# Patient Record
Sex: Female | Born: 1955
Health system: Southern US, Community
[De-identification: ages and names within clinical notes are randomized; demographics above are authoritative.]

## PROBLEM LIST (undated history)

## (undated) DIAGNOSIS — J45909 Unspecified asthma, uncomplicated: Secondary | ICD-10-CM

## (undated) DIAGNOSIS — I10 Essential (primary) hypertension: Secondary | ICD-10-CM

## (undated) DIAGNOSIS — B1920 Unspecified viral hepatitis C without hepatic coma: Secondary | ICD-10-CM

## (undated) DIAGNOSIS — F32A Depression, unspecified: Secondary | ICD-10-CM

## (undated) DIAGNOSIS — F329 Major depressive disorder, single episode, unspecified: Secondary | ICD-10-CM

## (undated) DIAGNOSIS — G44009 Cluster headache syndrome, unspecified, not intractable: Secondary | ICD-10-CM

## (undated) DIAGNOSIS — K2210 Ulcer of esophagus without bleeding: Secondary | ICD-10-CM

## (undated) HISTORY — DX: Depression, unspecified: F32.A

## (undated) HISTORY — DX: Cluster headache syndrome, unspecified, not intractable: G44.009

## (undated) HISTORY — DX: Unspecified asthma, uncomplicated: J45.909

## (undated) HISTORY — DX: Major depressive disorder, single episode, unspecified: F32.9

## (undated) HISTORY — DX: Essential (primary) hypertension: I10

## (undated) HISTORY — PX: NO PAST SURGERIES: SHX2092

## (undated) HISTORY — DX: Ulcer of esophagus without bleeding: K22.10

## (undated) HISTORY — DX: Unspecified viral hepatitis C without hepatic coma: B19.20

---

## 1998-08-23 ENCOUNTER — Encounter: Payer: Self-pay | Admitting: Gastroenterology

## 1998-08-23 ENCOUNTER — Ambulatory Visit (HOSPITAL_COMMUNITY): Admission: RE | Admit: 1998-08-23 | Discharge: 1998-08-23 | Payer: Self-pay | Admitting: Gastroenterology

## 1998-11-16 ENCOUNTER — Encounter (INDEPENDENT_AMBULATORY_CARE_PROVIDER_SITE_OTHER): Payer: Self-pay | Admitting: Specialist

## 1998-11-16 ENCOUNTER — Ambulatory Visit (HOSPITAL_COMMUNITY): Admission: RE | Admit: 1998-11-16 | Discharge: 1998-11-16 | Payer: Self-pay | Admitting: Gastroenterology

## 2000-03-16 ENCOUNTER — Encounter: Admission: RE | Admit: 2000-03-16 | Discharge: 2000-03-16 | Payer: Self-pay | Admitting: Obstetrics and Gynecology

## 2000-03-16 ENCOUNTER — Encounter: Payer: Self-pay | Admitting: Obstetrics and Gynecology

## 2001-05-29 ENCOUNTER — Encounter: Admission: RE | Admit: 2001-05-29 | Discharge: 2001-05-29 | Payer: Self-pay | Admitting: Obstetrics and Gynecology

## 2001-05-29 ENCOUNTER — Encounter: Payer: Self-pay | Admitting: Obstetrics and Gynecology

## 2002-10-01 ENCOUNTER — Encounter: Payer: Self-pay | Admitting: Family Medicine

## 2002-10-01 ENCOUNTER — Encounter: Admission: RE | Admit: 2002-10-01 | Discharge: 2002-10-01 | Payer: Self-pay | Admitting: Obstetrics and Gynecology

## 2008-06-23 ENCOUNTER — Ambulatory Visit (HOSPITAL_COMMUNITY): Admission: RE | Admit: 2008-06-23 | Discharge: 2008-06-23 | Payer: Self-pay | Admitting: Obstetrics and Gynecology

## 2009-07-24 ENCOUNTER — Emergency Department (HOSPITAL_COMMUNITY): Admission: EM | Admit: 2009-07-24 | Discharge: 2009-07-24 | Payer: Self-pay | Admitting: Family Medicine

## 2009-10-18 ENCOUNTER — Encounter: Admission: RE | Admit: 2009-10-18 | Discharge: 2009-10-18 | Payer: Self-pay | Admitting: Obstetrics and Gynecology

## 2010-07-13 LAB — POCT RAPID STREP A (OFFICE): Streptococcus, Group A Screen (Direct): NEGATIVE

## 2011-04-21 ENCOUNTER — Other Ambulatory Visit: Payer: Self-pay | Admitting: Obstetrics and Gynecology

## 2011-04-21 DIAGNOSIS — Z1231 Encounter for screening mammogram for malignant neoplasm of breast: Secondary | ICD-10-CM

## 2011-05-26 ENCOUNTER — Other Ambulatory Visit (HOSPITAL_COMMUNITY)
Admission: RE | Admit: 2011-05-26 | Discharge: 2011-05-26 | Disposition: A | Discharge status: Home / Self Care | Payer: 59 | Source: Ambulatory Visit | Attending: Obstetrics and Gynecology | Admitting: Obstetrics and Gynecology

## 2011-05-26 ENCOUNTER — Other Ambulatory Visit: Payer: Self-pay | Admitting: Obstetrics and Gynecology

## 2011-05-26 DIAGNOSIS — Z01419 Encounter for gynecological examination (general) (routine) without abnormal findings: Secondary | ICD-10-CM | POA: Insufficient documentation

## 2011-05-29 ENCOUNTER — Ambulatory Visit
Admission: RE | Admit: 2011-05-29 | Discharge: 2011-05-29 | Disposition: A | Discharge status: Home / Self Care | Payer: 59 | Source: Ambulatory Visit | Attending: Obstetrics and Gynecology | Admitting: Obstetrics and Gynecology

## 2011-05-29 DIAGNOSIS — Z1231 Encounter for screening mammogram for malignant neoplasm of breast: Secondary | ICD-10-CM

## 2011-06-01 ENCOUNTER — Other Ambulatory Visit: Payer: Self-pay | Admitting: Gastroenterology

## 2011-06-02 ENCOUNTER — Ambulatory Visit
Admission: RE | Admit: 2011-06-02 | Discharge: 2011-06-02 | Disposition: A | Discharge status: Home / Self Care | Payer: 59 | Source: Ambulatory Visit | Attending: Gastroenterology | Admitting: Gastroenterology

## 2011-06-06 ENCOUNTER — Other Ambulatory Visit: Payer: Self-pay | Admitting: Obstetrics and Gynecology

## 2011-06-06 DIAGNOSIS — R928 Other abnormal and inconclusive findings on diagnostic imaging of breast: Secondary | ICD-10-CM

## 2011-06-08 ENCOUNTER — Other Ambulatory Visit: Payer: Self-pay | Admitting: Gastroenterology

## 2011-06-08 DIAGNOSIS — E278 Other specified disorders of adrenal gland: Secondary | ICD-10-CM

## 2011-06-13 ENCOUNTER — Ambulatory Visit
Admission: RE | Admit: 2011-06-13 | Discharge: 2011-06-13 | Disposition: A | Discharge status: Home / Self Care | Payer: 59 | Source: Ambulatory Visit | Attending: Gastroenterology | Admitting: Gastroenterology

## 2011-06-13 DIAGNOSIS — E278 Other specified disorders of adrenal gland: Secondary | ICD-10-CM

## 2011-06-14 ENCOUNTER — Ambulatory Visit
Admission: RE | Admit: 2011-06-14 | Discharge: 2011-06-14 | Disposition: A | Discharge status: Home / Self Care | Payer: 59 | Source: Ambulatory Visit | Attending: Obstetrics and Gynecology | Admitting: Obstetrics and Gynecology

## 2011-06-14 DIAGNOSIS — R928 Other abnormal and inconclusive findings on diagnostic imaging of breast: Secondary | ICD-10-CM

## 2012-01-08 ENCOUNTER — Other Ambulatory Visit: Payer: Self-pay | Admitting: Obstetrics and Gynecology

## 2012-01-08 DIAGNOSIS — N6009 Solitary cyst of unspecified breast: Secondary | ICD-10-CM

## 2012-01-10 ENCOUNTER — Other Ambulatory Visit: Payer: Self-pay | Admitting: Obstetrics and Gynecology

## 2012-01-10 ENCOUNTER — Ambulatory Visit
Admission: RE | Admit: 2012-01-10 | Discharge: 2012-01-10 | Disposition: A | Discharge status: Home / Self Care | Payer: 59 | Source: Ambulatory Visit | Attending: Obstetrics and Gynecology | Admitting: Obstetrics and Gynecology

## 2012-01-10 DIAGNOSIS — N6009 Solitary cyst of unspecified breast: Secondary | ICD-10-CM

## 2012-05-27 ENCOUNTER — Other Ambulatory Visit (HOSPITAL_COMMUNITY)
Admission: RE | Admit: 2012-05-27 | Discharge: 2012-05-27 | Disposition: A | Discharge status: Home / Self Care | Payer: No Typology Code available for payment source | Source: Ambulatory Visit | Attending: Obstetrics and Gynecology | Admitting: Obstetrics and Gynecology

## 2012-05-27 ENCOUNTER — Other Ambulatory Visit: Payer: Self-pay | Admitting: Obstetrics and Gynecology

## 2012-05-27 DIAGNOSIS — Z1151 Encounter for screening for human papillomavirus (HPV): Secondary | ICD-10-CM | POA: Insufficient documentation

## 2012-05-27 DIAGNOSIS — Z01419 Encounter for gynecological examination (general) (routine) without abnormal findings: Secondary | ICD-10-CM | POA: Insufficient documentation

## 2012-07-05 ENCOUNTER — Other Ambulatory Visit: Payer: Self-pay | Admitting: Obstetrics and Gynecology

## 2012-07-05 DIAGNOSIS — N6310 Unspecified lump in the right breast, unspecified quadrant: Secondary | ICD-10-CM

## 2012-07-18 ENCOUNTER — Ambulatory Visit
Admission: RE | Admit: 2012-07-18 | Discharge: 2012-07-18 | Disposition: A | Discharge status: Home / Self Care | Payer: No Typology Code available for payment source | Source: Ambulatory Visit | Attending: Obstetrics and Gynecology | Admitting: Obstetrics and Gynecology

## 2012-07-18 ENCOUNTER — Other Ambulatory Visit: Payer: Self-pay | Admitting: Obstetrics and Gynecology

## 2012-07-18 DIAGNOSIS — N6310 Unspecified lump in the right breast, unspecified quadrant: Secondary | ICD-10-CM

## 2013-07-15 ENCOUNTER — Other Ambulatory Visit: Payer: Self-pay | Admitting: Family Medicine

## 2013-07-15 DIAGNOSIS — N63 Unspecified lump in unspecified breast: Secondary | ICD-10-CM

## 2013-07-29 ENCOUNTER — Ambulatory Visit
Admission: RE | Admit: 2013-07-29 | Discharge: 2013-07-29 | Disposition: A | Discharge status: Home / Self Care | Payer: BC Managed Care – PPO | Source: Ambulatory Visit | Attending: Family Medicine | Admitting: Family Medicine

## 2013-07-29 ENCOUNTER — Ambulatory Visit
Admission: RE | Admit: 2013-07-29 | Discharge: 2013-07-29 | Disposition: A | Discharge status: Home / Self Care | Payer: No Typology Code available for payment source | Source: Ambulatory Visit | Attending: Family Medicine | Admitting: Family Medicine

## 2013-07-29 DIAGNOSIS — N63 Unspecified lump in unspecified breast: Secondary | ICD-10-CM

## 2014-06-30 ENCOUNTER — Other Ambulatory Visit: Payer: Self-pay | Admitting: Dermatology

## 2014-09-08 ENCOUNTER — Other Ambulatory Visit (HOSPITAL_COMMUNITY)
Admission: RE | Admit: 2014-09-08 | Discharge: 2014-09-08 | Disposition: A | Discharge status: Home / Self Care | Payer: BLUE CROSS/BLUE SHIELD | Source: Ambulatory Visit | Attending: Obstetrics and Gynecology | Admitting: Obstetrics and Gynecology

## 2014-09-08 ENCOUNTER — Other Ambulatory Visit: Payer: Self-pay | Admitting: Obstetrics and Gynecology

## 2014-09-08 DIAGNOSIS — Z01419 Encounter for gynecological examination (general) (routine) without abnormal findings: Secondary | ICD-10-CM | POA: Insufficient documentation

## 2014-09-10 LAB — CYTOLOGY - PAP

## 2014-11-02 ENCOUNTER — Other Ambulatory Visit: Payer: Self-pay

## 2014-11-02 DIAGNOSIS — Z1231 Encounter for screening mammogram for malignant neoplasm of breast: Secondary | ICD-10-CM

## 2014-11-04 ENCOUNTER — Ambulatory Visit
Admission: RE | Admit: 2014-11-04 | Discharge: 2014-11-04 | Disposition: A | Discharge status: Home / Self Care | Payer: BLUE CROSS/BLUE SHIELD | Source: Ambulatory Visit

## 2014-11-04 DIAGNOSIS — Z1231 Encounter for screening mammogram for malignant neoplasm of breast: Secondary | ICD-10-CM

## 2015-01-13 ENCOUNTER — Ambulatory Visit
Admission: RE | Admit: 2015-01-13 | Discharge: 2015-01-13 | Disposition: A | Discharge status: Home / Self Care | Payer: BLUE CROSS/BLUE SHIELD | Source: Ambulatory Visit | Attending: Family Medicine | Admitting: Family Medicine

## 2015-01-13 ENCOUNTER — Other Ambulatory Visit: Payer: Self-pay | Admitting: Family Medicine

## 2015-01-13 DIAGNOSIS — R05 Cough: Secondary | ICD-10-CM

## 2015-01-13 DIAGNOSIS — R059 Cough, unspecified: Secondary | ICD-10-CM

## 2015-10-12 DIAGNOSIS — J45909 Unspecified asthma, uncomplicated: Secondary | ICD-10-CM | POA: Insufficient documentation

## 2015-10-12 DIAGNOSIS — I10 Essential (primary) hypertension: Secondary | ICD-10-CM | POA: Insufficient documentation

## 2015-10-12 DIAGNOSIS — F32A Depression, unspecified: Secondary | ICD-10-CM | POA: Insufficient documentation

## 2015-10-12 DIAGNOSIS — F329 Major depressive disorder, single episode, unspecified: Secondary | ICD-10-CM | POA: Insufficient documentation

## 2015-10-22 ENCOUNTER — Encounter: Payer: Self-pay | Admitting: *Deleted

## 2015-10-24 NOTE — Progress Notes (Signed)
Cardiology Office Note   Date:  10/25/2015   ID:  [name], DOB [birth year], MRN [id number]  PCP:  Lujean Amel, MD  Cardiologist:   Jenkins Rouge, MD   Chief Complaint  Patient presents with  . Establish Care      History of Present Illness: [name] is a 60 y.o. female who presents for  Evaluation of edema Significant depression. Husband committed suicide from carbon monoxide poisoning 2016  She has had HTN.  Rx with amlodipine and diuretics. No history  Of DVT.  Sedentary with high salt and carb diet. No chest pain Concern about Risk of CAD and MI.    Labs primary Dr [name] 06/2014 LDL 79 TC 175   Past Medical History  Diagnosis Date  . HTN (hypertension)   . Depression   . Asthma   . Headaches, cluster   . Hepatitis C   . Erosive esophagitis     Past Surgical History  Procedure Laterality Date  . No past surgeries       Current Outpatient Prescriptions  Medication Sig Dispense Refill  . amLODipine (NORVASC) 5 MG tablet Take 5 mg by mouth daily.    . Calcium Carb-Cholecalciferol (CALCIUM-VITAMIN D3) 600-400 MG-UNIT TABS Take 1 tablet by mouth daily.    . hydrochlorothiazide (HYDRODIURIL) 25 MG tablet Take 25 mg by mouth daily.    Marland Kitchen HYDROcodone-acetaminophen (NORCO/VICODIN) 5-325 MG tablet Take 1 tablet by mouth daily.  0  . Multiple Vitamins-Minerals (MULTIVITAMIN PO) Take 1 tablet by mouth daily.    . naproxen (NAPROSYN) 500 MG tablet Take 500 mg by mouth daily.    . Omega-3 Fatty Acids (FISH OIL) 1200 MG CAPS Take 1 capsule by mouth daily.    Marland Kitchen omeprazole (PRILOSEC) 40 MG capsule Take 40 mg by mouth daily.  10  . Venlafaxine HCl 75 MG TB24 Take 75 mg by mouth daily.     No current facility-administered medications for this visit.    Allergies:   Atenolol; Lisinopril; Paxil; Zoloft; and Sulfur    Social History:  The patient  reports that she has never smoked. She does not have any smokeless tobacco history on file. 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 Infrequent wine consumption   Family History:  The patient's family history includes Bipolar disorder in her daughter; Colitis in her brother; Coronary artery disease in her mother; Crohn's disease in her sister; Diabetes Mellitus II in her mother; Emphysema in her father; Heart attack in her sister; Heart disease in her brother, brother, and mother; Irritable bowel syndrome in her daughter; Liver disease in her sister; Stroke in her brother.    ROS:  Please see the history of present illness.   Otherwise, review of systems are positive for none.   All other systems are reviewed and negative.    PHYSICAL EXAM: VS:  BP 120/90 mmHg  Pulse 63  Ht 5\' 4"  (1.626 m)  Wt 214 lb 12.8 oz (97.433 kg)  BMI 36.85 kg/m2  SpO2 95% , BMI Body mass index is 36.85 kg/(m^2). Affect appropriate Healthy:  appears stated age 73: normal Neck supple with no adenopathy JVP normal no bruits no thyromegaly Lungs clear with no wheezing and good diaphragmatic motion Heart:  S1/S2 no murmur, no rub, gallop or click PMI normal Abdomen: benighn, BS positve, no tenderness, no AAA no bruit.  No HSM or HJR Distal pulses intact with no bruits Plus  1-2 bilateral edema Neuro non-focal Skin warm and dry No muscular weakness    EKG:   10/25/15 SR rate 83 ICRBBB    Recent Labs: No results found for requested labs within last 365 days.    Lipid Panel No results found for: CHOL, TRIG, HDL, CHOLHDL, VLDL, LDLCALC, LDLDIRECT    Wt Readings from Last 3 Encounters:  10/25/15 214 lb 12.8 oz (97.433 kg)      Other studies Reviewed: Additional studies/ records that were reviewed today include: Records from Dr Sadie Haber Dr [name] over 72 pages see HPI .    ASSESSMENT AND PLAN:  1.  HTN  Stop amlodipine given edema continue diuretics 2. Edema:  Check echo and LE venous studies make sure RV/LV function normal r/o venous insuficiency  If  edema persists Off norvasc will change to stronger diuretic like lasix 3. CAD- risk no indication for stress testing ECG ok discussed Utility of calcium score for her to consider    Current medicines are reviewed at length with the patient today.  The patient does not have concerns regarding medicines.  The following changes have been made:  D/c Norvasc  Labs/ tests ordered today include: LE venous Echo    Disposition:   FU with me in a few weeks after tests      Signed, Jenkins Rouge, MD  10/25/2015 5:17 PM    [facility] [place], [place],   [address] Phone: [phone]; Fax: [phone]

## 2015-10-25 ENCOUNTER — Ambulatory Visit (INDEPENDENT_AMBULATORY_CARE_PROVIDER_SITE_OTHER): Payer: BLUE CROSS/BLUE SHIELD | Admitting: Cardiovascular Disease

## 2015-10-25 ENCOUNTER — Encounter: Payer: Self-pay | Admitting: Cardiovascular Disease

## 2015-10-25 VITALS — BP 120/90 | HR 63 | Ht 64.0 in | Wt 214.8 lb

## 2015-10-25 DIAGNOSIS — R0602 Shortness of breath: Secondary | ICD-10-CM

## 2015-10-25 DIAGNOSIS — Z7189 Other specified counseling: Secondary | ICD-10-CM | POA: Diagnosis not present

## 2015-10-25 DIAGNOSIS — I10 Essential (primary) hypertension: Secondary | ICD-10-CM | POA: Diagnosis not present

## 2015-10-25 DIAGNOSIS — R609 Edema, unspecified: Secondary | ICD-10-CM | POA: Diagnosis not present

## 2015-10-25 DIAGNOSIS — Z7689 Persons encountering health services in other specified circumstances: Secondary | ICD-10-CM

## 2015-10-25 NOTE — Patient Instructions (Addendum)
Medication Instructions:  Your physician has recommended you make the following change in your medication:  1-STOP amlodipine   Lab work: NONE  Testing/Procedures: Your physician has requested that you have an echocardiogram. Echocardiography is a painless test that uses sound waves to create images of your heart. It provides your doctor with information about the size and shape of your heart and how well your heart's chambers and valves are working. This procedure takes approximately one hour. There are no restrictions for this procedure.  Your physician has requested that you have a lower extremity venous duplex. This test is an ultrasound of the veins in the legs. It looks at venous blood flow that carries blood from the heart to the legs or arms. Allow one hour for a Lower Venous exam. There are no restrictions or special instructions.  Cardiac Ca Scote scanning, (CAT scanning), is a noninvasive, special x-ray that produces cross-sectional images of the body using x-rays and a computer.   Follow-Up: Your physician wants you to follow-up in: 1 month with Dr. Johnsie Cancel.   If you need a refill on your cardiac medications before your next appointment, please call your pharmacy.

## 2015-11-05 ENCOUNTER — Ambulatory Visit (HOSPITAL_COMMUNITY)
Admission: RE | Admit: 2015-11-05 | Discharge: 2015-11-05 | Disposition: A | Discharge status: Home / Self Care | Payer: BLUE CROSS/BLUE SHIELD | Source: Ambulatory Visit | Attending: Cardiovascular Disease | Admitting: Cardiovascular Disease

## 2015-11-05 DIAGNOSIS — Z7189 Other specified counseling: Secondary | ICD-10-CM | POA: Diagnosis not present

## 2015-11-05 DIAGNOSIS — Z7689 Persons encountering health services in other specified circumstances: Secondary | ICD-10-CM

## 2015-11-05 DIAGNOSIS — F329 Major depressive disorder, single episode, unspecified: Secondary | ICD-10-CM | POA: Insufficient documentation

## 2015-11-05 DIAGNOSIS — R0602 Shortness of breath: Secondary | ICD-10-CM | POA: Insufficient documentation

## 2015-11-05 DIAGNOSIS — I10 Essential (primary) hypertension: Secondary | ICD-10-CM

## 2015-11-05 DIAGNOSIS — R609 Edema, unspecified: Secondary | ICD-10-CM | POA: Insufficient documentation

## 2015-11-08 ENCOUNTER — Telehealth: Payer: Self-pay | Admitting: Cardiovascular Disease

## 2015-11-08 NOTE — Telephone Encounter (Signed)
Follow-up     The pt is returning [place] call

## 2015-11-08 NOTE — Telephone Encounter (Signed)
PT AWARE  OF   LOWER VENOUS  RESULTS .Adonis Housekeeper

## 2015-11-11 ENCOUNTER — Ambulatory Visit (INDEPENDENT_AMBULATORY_CARE_PROVIDER_SITE_OTHER)
Admission: RE | Admit: 2015-11-11 | Discharge: 2015-11-11 | Disposition: A | Discharge status: Home / Self Care | Payer: Self-pay | Source: Ambulatory Visit | Attending: Cardiovascular Disease | Admitting: Cardiovascular Disease

## 2015-11-11 ENCOUNTER — Ambulatory Visit (HOSPITAL_COMMUNITY): Payer: BLUE CROSS/BLUE SHIELD | Attending: Cardiology

## 2015-11-11 DIAGNOSIS — Z7689 Persons encountering health services in other specified circumstances: Secondary | ICD-10-CM

## 2015-11-11 DIAGNOSIS — Z7189 Other specified counseling: Secondary | ICD-10-CM

## 2015-11-11 DIAGNOSIS — R0602 Shortness of breath: Secondary | ICD-10-CM | POA: Diagnosis not present

## 2015-11-11 DIAGNOSIS — Z8249 Family history of ischemic heart disease and other diseases of the circulatory system: Secondary | ICD-10-CM | POA: Insufficient documentation

## 2015-11-11 DIAGNOSIS — R609 Edema, unspecified: Secondary | ICD-10-CM

## 2015-11-11 DIAGNOSIS — I119 Hypertensive heart disease without heart failure: Secondary | ICD-10-CM | POA: Insufficient documentation

## 2015-11-11 DIAGNOSIS — I10 Essential (primary) hypertension: Secondary | ICD-10-CM | POA: Diagnosis not present

## 2015-11-11 DIAGNOSIS — R0600 Dyspnea, unspecified: Secondary | ICD-10-CM | POA: Diagnosis present

## 2015-12-01 ENCOUNTER — Encounter: Payer: Self-pay | Admitting: Cardiovascular Disease

## 2015-12-01 ENCOUNTER — Ambulatory Visit (INDEPENDENT_AMBULATORY_CARE_PROVIDER_SITE_OTHER): Payer: BLUE CROSS/BLUE SHIELD | Admitting: Cardiovascular Disease

## 2015-12-01 VITALS — BP 136/80 | HR 96 | Ht 64.0 in | Wt 216.0 lb

## 2015-12-01 DIAGNOSIS — R0602 Shortness of breath: Secondary | ICD-10-CM

## 2015-12-01 NOTE — Progress Notes (Signed)
Cardiology Office Note   Date:  12/01/2015   ID:  [name], DOB 02-Sep-1955, MRN [id number]  PCP:  [name] Amel, MD  Cardiologist:   [name] Rouge, MD   Chief Complaint  Patient presents with  . Shortness of Breath    seen for SOB & swelling in ankles, swelling has went down and is good, SOB is still an issue with activity, pe rpt      History of Present Illness: [name] is a 60 y.o. female who presents for  Evaluation of edema first seen 10/25/15  Significant depression. Husband committed suicide from carbon monoxide poisoning 2016  She has had HTN.  Rx with amlodipine and diuretics. No history  Of DVT.  Sedentary with high salt and carb diet. No chest pain Concern about Risk of CAD and MI.    Labs primary Dr [name] 06/2014 LDL 79 TC 175   Norvasc d/c   Echo reviewed: 11/11/15 EF normal AV sclerotic normal diastolic function  LE venous Duplex 11/05/15 no DVT  Calcium Score : 11/11/15 0  Past Medical History:  Diagnosis Date  . Asthma   . Depression   . Erosive esophagitis   . Headaches, cluster   . Hepatitis C   . HTN (hypertension)     Past Surgical History:  Procedure Laterality Date  . NO PAST SURGERIES       Current Outpatient Prescriptions  Medication Sig Dispense Refill  . Calcium Carb-Cholecalciferol (CALCIUM-VITAMIN D3) 600-400 MG-UNIT TABS Take 1 tablet by mouth daily.    . hydrochlorothiazide (HYDRODIURIL) 25 MG tablet Take 25 mg by mouth daily.    Marland Kitchen HYDROcodone-acetaminophen (NORCO/VICODIN) 5-325 MG tablet Take 1 tablet by mouth daily as needed for pain.  0  . Multiple Vitamins-Minerals (MULTIVITAMIN PO) Take 1 tablet by mouth daily.    . naproxen (NAPROSYN) 500 MG tablet Take 500 mg by mouth daily as needed for mild pain or moderate pain.    . Omega-3 Fatty Acids (FISH OIL) 1200 MG CAPS Take 1 capsule by mouth daily.    Marland Kitchen omeprazole (PRILOSEC) 40 MG capsule Take 40 mg by mouth daily.  10  . Venlafaxine HCl 75 MG TB24 Take 75 mg by mouth  daily.     No current facility-administered medications for this visit.     Allergies:   Atenolol; Lisinopril; Paxil [paroxetine hcl]; Zoloft [sertraline hcl]; and Sulfur    Social History:  The patient  reports that she has never smoked. She does not have any smokeless tobacco history on file. 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 Infrequent wine consumption   Family History:  The patient's family history includes Bipolar disorder in her daughter; Colitis in her brother; Coronary artery disease in her mother; Crohn's disease in her sister; Diabetes Mellitus II in her mother; Emphysema in her father; Heart attack in her sister; Heart disease in her brother, brother, and mother; Irritable bowel syndrome in her daughter; Liver disease in her sister; Stroke in her brother.    ROS:  Please see the history of present illness.   Otherwise, review of systems are positive for none.   All other systems are reviewed and negative.    PHYSICAL EXAM: VS:  BP 136/80 (BP Location: Left Arm, Patient Position: Sitting, Cuff Size: Large)   Pulse 96   Ht 5\' 4"  (1.626 m)   Wt 216 lb (98 kg)   SpO2 98%   BMI  37.08 kg/m  , BMI Body mass index is 37.08 kg/m. Affect appropriate Healthy:  appears stated age 55: normal Neck supple with no adenopathy JVP normal no bruits no thyromegaly Lungs clear with no wheezing and good diaphragmatic motion Heart:  S1/S2 no murmur, no rub, gallop or click PMI normal Abdomen: benighn, BS positve, no tenderness, no AAA no bruit.  No HSM or HJR Distal pulses intact with no bruits Plus 1-2 bilateral edema Neuro non-focal Skin warm and dry No muscular weakness    EKG:   10/25/15 SR rate 83 ICRBBB    Recent Labs: No results found for requested labs within last 8760 hours.    Lipid Panel No results found for: CHOL, TRIG, HDL, CHOLHDL, VLDL, LDLCALC, LDLDIRECT    Wt Readings from Last 3 Encounters:   12/01/15 216 lb (98 kg)  10/25/15 214 lb 12.8 oz (97.4 kg)      Other studies Reviewed: Additional studies/ records that were reviewed today include: Records from Dr [name] Dr [name] over 85 pages see HPI .    ASSESSMENT AND PLAN:  1.  HTN   Some improvement still a bit high on home readings she will try to continue With lifestyle modifications.  If still high next visit would start cardizem or hydralazine 2. Edema:   Echo and LE duplex fine better of norvasc continue diuretic 3. CAD- risk low calcium score is 0 no need for further testing    [name]

## 2015-12-01 NOTE — Patient Instructions (Signed)
Medication Instructions:  Your physician recommends that you continue on your current medications as directed. Please refer to the Current Medication list given to you today.  Labwork: NONE  Testing/Procedures: NONE  Follow-Up: Your physician wants you to follow-up next available with Dr. Nishan.   If you need a refill on your cardiac medications before your next appointment, please call your pharmacy.    

## 2016-01-11 ENCOUNTER — Other Ambulatory Visit (HOSPITAL_COMMUNITY)
Admission: RE | Admit: 2016-01-11 | Discharge: 2016-01-11 | Disposition: A | Discharge status: Home / Self Care | Payer: BLUE CROSS/BLUE SHIELD | Source: Ambulatory Visit | Attending: Obstetrics and Gynecology | Admitting: Obstetrics and Gynecology

## 2016-01-11 ENCOUNTER — Other Ambulatory Visit: Payer: Self-pay | Admitting: Obstetrics and Gynecology

## 2016-01-11 DIAGNOSIS — Z01419 Encounter for gynecological examination (general) (routine) without abnormal findings: Secondary | ICD-10-CM | POA: Diagnosis present

## 2016-01-11 DIAGNOSIS — Z1151 Encounter for screening for human papillomavirus (HPV): Secondary | ICD-10-CM | POA: Diagnosis not present

## 2016-01-13 LAB — CYTOLOGY - PAP

## 2016-02-28 ENCOUNTER — Other Ambulatory Visit: Payer: Self-pay | Admitting: Obstetrics and Gynecology

## 2016-02-28 DIAGNOSIS — Z1231 Encounter for screening mammogram for malignant neoplasm of breast: Secondary | ICD-10-CM

## 2016-03-15 ENCOUNTER — Ambulatory Visit (INDEPENDENT_AMBULATORY_CARE_PROVIDER_SITE_OTHER): Payer: BLUE CROSS/BLUE SHIELD | Admitting: Cardiovascular Disease

## 2016-03-15 ENCOUNTER — Encounter: Payer: Self-pay | Admitting: Cardiovascular Disease

## 2016-03-15 ENCOUNTER — Encounter (INDEPENDENT_AMBULATORY_CARE_PROVIDER_SITE_OTHER): Payer: Self-pay

## 2016-03-15 VITALS — BP 140/80 | HR 94 | Ht 64.0 in | Wt 201.2 lb

## 2016-03-15 DIAGNOSIS — Z09 Encounter for follow-up examination after completed treatment for conditions other than malignant neoplasm: Secondary | ICD-10-CM | POA: Diagnosis not present

## 2016-03-15 NOTE — Progress Notes (Signed)
Cardiology Office Note   Date:  03/15/2016   ID:  [name], DOB 1955-09-27, MRN [id number]  PCP:  Lujean Amel, MD  Cardiologist:   Jenkins Rouge, MD   Chief Complaint  Patient presents with  . Follow-up      History of Present Illness: [name] is a 60 y.o. female who presents for  Evaluation of edema first seen 10/25/15  Significant depression. Husband committed suicide from carbon monoxide poisoning 2016  She has had HTN.  Rx with amlodipine and diuretics. No history  Of DVT.  Sedentary with high salt and carb diet. No chest pain Concern about Risk of CAD and MI.    Labs primary Dr [name] 06/2014 LDL 79 TC 175   Norvasc d/c   Echo reviewed: 11/11/15 EF normal AV sclerotic normal diastolic function  LE venous Duplex 11/05/15 no DVT  Calcium Score : 11/11/15 0  Life still stressful. Daughter is pregnant and started spotting Her 60 yo girl Maddy just got diagnosed with type one DM  Past Medical History:  Diagnosis Date  . Asthma   . Depression   . Erosive esophagitis   . Headaches, cluster   . Hepatitis C   . HTN (hypertension)     Past Surgical History:  Procedure Laterality Date  . NO PAST SURGERIES       Current Outpatient Prescriptions  Medication Sig Dispense Refill  . Calcium Carb-Cholecalciferol (CALCIUM-VITAMIN D3) 600-400 MG-UNIT TABS Take 1 tablet by mouth daily.    . Glucosamine-Chondroitin (OSTEO BI-FLEX REGULAR STRENGTH PO) Take 1 tablet by mouth daily.    . hydrochlorothiazide (HYDRODIURIL) 25 MG tablet Take 25 mg by mouth daily.    Marland Kitchen HYDROcodone-acetaminophen (NORCO/VICODIN) 5-325 MG tablet Take 1 tablet by mouth daily as needed for pain.  0  . Multiple Vitamins-Minerals (MULTIVITAMIN PO) Take 1 tablet by mouth daily.    . naproxen (NAPROSYN) 500 MG tablet Take 500 mg by mouth daily as needed for mild pain or moderate pain.    . Omega-3 Fatty Acids (FISH OIL) 1200 MG CAPS Take 1 capsule by mouth daily.    Marland Kitchen omeprazole (PRILOSEC) 40  MG capsule Take 40 mg by mouth daily.  10  . Venlafaxine HCl 75 MG TB24 Take 75 mg by mouth daily.     No current facility-administered medications for this visit.     Allergies:   Atenolol; Lisinopril; Paxil [paroxetine hcl]; Zoloft [sertraline hcl]; and Sulfur    Social History:  The patient  reports that she has never smoked. She has never used smokeless tobacco. 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 Infrequent wine consumption   Family History:  The patient's family history includes Bipolar disorder in her daughter; Colitis in her brother; Coronary artery disease in her mother; Crohn's disease in her sister; Diabetes Mellitus II in her mother; Emphysema in her father; Heart attack in her sister; Heart disease in her brother, brother, and mother; Irritable bowel syndrome in her daughter; Liver disease in her sister; Stroke in her brother.    ROS:  Please see the history of present illness.   Otherwise, review of systems are positive for none.   All other systems are reviewed and negative.    PHYSICAL EXAM: VS:  BP 140/80   Pulse 94   Ht 5\' 4"  (1.626 m)   Wt 91.3 kg (201 lb 3.2 oz)   SpO2 97%   BMI 34.54 kg/m  ,  BMI Body mass index is 34.54 kg/m. Affect appropriate Healthy:  appears stated age 36: normal Neck supple with no adenopathy JVP normal no bruits no thyromegaly Lungs clear with no wheezing and good diaphragmatic motion Heart:  S1/S2 no murmur, no rub, gallop or click PMI normal Abdomen: benighn, BS positve, no tenderness, no AAA no bruit.  No HSM or HJR Distal pulses intact with no bruits Plus 1-2 bilateral edema Neuro non-focal Skin warm and dry No muscular weakness    EKG:   10/25/15 SR rate 83 ICRBBB    Recent Labs: No results found for requested labs within last 8760 hours.    Lipid Panel No results found for: CHOL, TRIG, HDL, CHOLHDL, VLDL, LDLCALC, LDLDIRECT    Wt Readings from Last  3 Encounters:  03/15/16 91.3 kg (201 lb 3.2 oz)  12/01/15 98 kg (216 lb)  10/25/15 97.4 kg (214 lb 12.8 oz)      Other studies Reviewed: Additional studies/ records that were reviewed today include: Records from Dr Sadie Haber Dr [name] over 61 pages see HPI .    ASSESSMENT AND PLAN:  1.  HTN   Improved continue to go to gym and lose weight  2. Edema:   Echo and LE duplex fine better of norvasc continue diuretic 3. CAD- risk low calcium score is 0 no need for further testing    Jenkins Rouge

## 2016-03-15 NOTE — Patient Instructions (Signed)
Medication Instructions:  Your physician recommends that you continue on your current medications as directed. Please refer to the Current Medication list given to you today.  Labwork: None  Testing/Procedures: None  Follow-Up: Your physician wants you to follow-up in: 1 year with Dr. Nishan.  You will receive a reminder letter in the mail two months in advance. If you don't receive a letter, please call our office to schedule the follow-up appointment.   Any Other Special Instructions Will Be Listed Below (If Applicable).     If you need a refill on your cardiac medications before your next appointment, please call your pharmacy.   

## 2016-03-30 ENCOUNTER — Ambulatory Visit
Admission: RE | Admit: 2016-03-30 | Discharge: 2016-03-30 | Disposition: A | Discharge status: Home / Self Care | Payer: BLUE CROSS/BLUE SHIELD | Source: Ambulatory Visit | Attending: Obstetrics and Gynecology | Admitting: Obstetrics and Gynecology

## 2016-03-30 DIAGNOSIS — Z1231 Encounter for screening mammogram for malignant neoplasm of breast: Secondary | ICD-10-CM

## 2016-07-07 ENCOUNTER — Encounter: Payer: Self-pay | Admitting: Podiatry

## 2016-07-07 ENCOUNTER — Ambulatory Visit (INDEPENDENT_AMBULATORY_CARE_PROVIDER_SITE_OTHER): Payer: BLUE CROSS/BLUE SHIELD | Admitting: Podiatry

## 2016-07-07 DIAGNOSIS — L600 Ingrowing nail: Secondary | ICD-10-CM | POA: Diagnosis not present

## 2016-07-07 NOTE — Patient Instructions (Signed)

## 2016-07-09 NOTE — Progress Notes (Signed)
Subjective:     Patient ID: [name], female   DOB: 22-Dec-1955, 61 y.o.   MRN: [id number]  HPI patient presents stating that the left nail has been really bothersome and making it hard for her to be comfortable and she's tried to soak and trimming   Review of Systems  All other systems reviewed and are negative.      Objective:   Physical Exam  Constitutional: She is oriented to person, place, and time.  Neurological: She is alert and oriented to person, place, and time.  Skin: Skin is warm.  Nursing note and vitals reviewed.  neurovascular status found to be intact with muscle strength adequate range of motion within normal limits with patient found have left hallux lateral border incurvated and painful when palpated. Patient's noted to have good digital perfusion well oriented 3     Assessment:     Significant ingrown toenail deformity left hallux lateral border    Plan:     H&P condition reviewed and recommended removal of the border. Explained procedure and risk and patient wants surgery and today I infiltrated the left hallux 60 Milligan's I can Marcaine mixture remove the lateral border exposed matrix and applied phenol 3 applications 30 seconds followed by alcohol lavage and sterile dressing. Gave instructions on soaks and reappoint

## 2016-09-27 ENCOUNTER — Encounter: Payer: Self-pay | Admitting: Podiatry

## 2016-09-27 ENCOUNTER — Ambulatory Visit (INDEPENDENT_AMBULATORY_CARE_PROVIDER_SITE_OTHER): Payer: BLUE CROSS/BLUE SHIELD | Admitting: Podiatry

## 2016-09-27 DIAGNOSIS — L03032 Cellulitis of left toe: Secondary | ICD-10-CM

## 2016-09-27 NOTE — Progress Notes (Signed)
Subjective:    Patient ID: [name], female   DOB: 61 y.o.   MRN: [id number]   HPI patient presents with irritation of the left big toenail lateral side that we had done procedure on. Patient states it's crusted and it's red and sore    ROS      Objective:  Physical Exam neurovascular status intact negative Homans sign noted with crusted tissue with proximal erythema left hallux lateral side that's localized in nature with no active drainage noted     Assessment:    Localized paronychia infection left hallux lateral border with crusted tissue     Plan:   H&P condition reviewed and today I infiltrated the left hallux 60 Milligan times like Marcaine mixture remove the lateral side removed all proud flesh abscess tissue and allowed channel for drainage and applied sterile dressing. Reappoint to recheck

## 2017-03-12 ENCOUNTER — Other Ambulatory Visit: Payer: Self-pay | Admitting: Obstetrics and Gynecology

## 2017-03-12 DIAGNOSIS — Z1231 Encounter for screening mammogram for malignant neoplasm of breast: Secondary | ICD-10-CM

## 2017-04-09 ENCOUNTER — Ambulatory Visit
Admission: RE | Admit: 2017-04-09 | Discharge: 2017-04-09 | Disposition: A | Discharge status: Home / Self Care | Payer: BLUE CROSS/BLUE SHIELD | Source: Ambulatory Visit | Attending: Obstetrics and Gynecology | Admitting: Obstetrics and Gynecology

## 2017-04-09 DIAGNOSIS — Z1231 Encounter for screening mammogram for malignant neoplasm of breast: Secondary | ICD-10-CM

## 2017-05-16 ENCOUNTER — Telehealth: Payer: Self-pay | Admitting: Cardiovascular Disease

## 2017-05-16 NOTE — Telephone Encounter (Signed)
   Primary Cardiologist:No primary care provider on file.  Chart reviewed as part of pre-operative protocol coverage. Because of [name]'s past medical history and time since last visit, he/she will require a follow-up visit in order to better assess preoperative cardiovascular risk.  She has a visit with Dr. Johnsie Cancel in February - would ask her to keep this and surgical clearance will be addressed at that time. Her knee surgery is set up for April of 2018.   Pre-op covering staff: - Please schedule appointment and call patient to inform them. - Please contact requesting surgeon's office via preferred method (i.e, phone, fax) to inform them of need for appointment prior to surgery.  Truitt Merle, NP  05/16/2017, 2:35 PM

## 2017-05-16 NOTE — Telephone Encounter (Signed)
° °  [facility] Pre-operative Risk Assessment    Request for surgical clearance:  1. What type of surgery is being performed? Right Uni Knee Replacement    2. When is this surgery scheduled? 08/02/17  3. What type of clearance is required (medical clearance vs. Pharmacy clearance to hold med vs. Both)? Medical clearance   4. Are there any medications that need to be held prior to surgery and how long? Please indicate any medications that need to be held.   5. Practice name and name of physician performing surgery? [facility], Dr. Marchia Bond   6. What is your office phone and fax number? PH# [phone] ext [phone] [name] [phone], ATTN: Sherri   7. Anesthesia type (None, local, MAC, general) ? Not listed.    [name] 05/16/2017, 10:38 AM  _________________________________________________________________   (provider comments below)

## 2017-05-16 NOTE — Telephone Encounter (Signed)
S/w pt to advise to keep upcoming appt with Dr. Johnsie Cancel in February to talk about surgical clearance for knee surgery in April.

## 2017-05-28 NOTE — Progress Notes (Signed)
Cardiology Office Note   Date:  06/04/2017   ID:  [name], DOB Feb 18, 1956, MRN [id number]  PCP:  Lujean Amel, MD  Cardiologist:   Jenkins Rouge, MD   No chief complaint on file.     History of Present Illness: [name] is a 62 y.o. female who presents for f/u edema and HTN  First seen 10/25/15  Significant depression. Husband committed suicide from carbon monoxide poisoning 2016  She has had HTN.  Rx with amlodipine and diuretics. No history  Of DVT.  Sedentary with high salt and carb diet. No chest pain Concern about Risk of CAD and MI.    Norvasc d/c   Echo reviewed: 11/11/15 EF normal AV sclerotic normal diastolic function  LE venous Duplex 11/05/15 no DVT  Calcium Score : 11/11/15 0  Stressed Her100 yo  Cla[place] daughter  Maddy ha  type one DM  She is scheduled for right knee replacement with Dr Noemi Chapel in April She is clear to have this from cardiac perspective   She has been taking narcotic pain pills and can't exercise Failed Injections, rooster cone and nerve blocks   Past Medical History:  Diagnosis Date  . Asthma   . Depression   . Erosive esophagitis   . Headaches, cluster   . Hepatitis C   . HTN (hypertension)     Past Surgical History:  Procedure Laterality Date  . NO PAST SURGERIES       Current Outpatient Medications  Medication Sig Dispense Refill  . Calcium Carb-Cholecalciferol (CALCIUM-VITAMIN D3) 600-400 MG-UNIT TABS Take 1 tablet by mouth daily.    . Glucosamine-Chondroitin (OSTEO BI-FLEX REGULAR STRENGTH PO) Take 1 tablet by mouth daily.    . hydrochlorothiazide (HYDRODIURIL) 25 MG tablet Take 25 mg by mouth daily.    Marland Kitchen HYDROcodone-acetaminophen (NORCO/VICODIN) 5-325 MG tablet Take 1 tablet by mouth daily as needed for pain.  0  . irbesartan (AVAPRO) 150 MG tablet Take 1 tablet by mouth daily.    . Multiple Vitamins-Minerals (MULTIVITAMIN PO) Take 1 tablet by mouth daily.    . naproxen (NAPROSYN) 500 MG tablet Take 500 mg by  mouth daily as needed for mild pain or moderate pain.    . Omega-3 Fatty Acids (FISH OIL) 1200 MG CAPS Take 1 capsule by mouth daily.    Marland Kitchen omeprazole (PRILOSEC) 40 MG capsule Take 40 mg by mouth daily.  10  . Venlafaxine HCl 75 MG TB24 Take 75 mg by mouth daily.     No current facility-administered medications for this visit.     Allergies:   Atenolol; Lisinopril; Paxil [paroxetine hcl]; Zoloft [sertraline hcl]; and Sulfur    Social History:  The patient  reports that  has never smoked. she has never used smokeless tobacco. 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 Infrequent wine consumption   Family History:  The patient's family history includes Bipolar disorder in her daughter; Colitis in her brother; Coronary artery disease in her mother; Crohn's disease in her sister; Diabetes Mellitus II in her mother; Emphysema in her father; Heart attack in her sister; Heart disease in her brother, brother, and mother; Irritable bowel syndrome in her daughter; Liver disease in her sister; Stroke in her brother.    ROS:  Please see the history of present illness.   Otherwise, review of systems are positive for none.   All other systems are reviewed and negative.  PHYSICAL EXAM: VS:  BP (!) 154/68 Comment: 138 82  Pulse (!) 115   Ht 5\' 4"  (1.626 m)   Wt 211 lb 3.2 oz (95.8 kg)   SpO2 98%   BMI 36.25 kg/m  , BMI Body mass index is 36.25 kg/m. Affect appropriate Healthy:  appears stated age 82: normal Neck supple with no adenopathy JVP normal no bruits no thyromegaly Lungs clear with no wheezing and good diaphragmatic motion Heart:  S1/S2 no murmur, no rub, gallop or click PMI normal Abdomen: benighn, BS positve, no tenderness, no AAA no bruit.  No HSM or HJR Distal pulses intact with no bruits No edema Neuro non-focal Skin warm and dry Right knee pain/arthritis with decreased ROM      EKG:   10/25/15 SR rate 83 ICRBBB   06/04/17 SR rate 94 low voltage    Recent Labs: No results found for requested labs within last 8760 hours.    Lipid Panel No results found for: CHOL, TRIG, HDL, CHOLHDL, VLDL, LDLCALC, LDLDIRECT    Wt Readings from Last 3 Encounters:  06/04/17 211 lb 3.2 oz (95.8 kg)  03/15/16 201 lb 3.2 oz (91.3 kg)  12/01/15 216 lb (98 kg)      Other studies Reviewed: Additional studies/ records that were reviewed today include: Records from Dr Sadie Haber Dr [name] over 69 pages see HPI .    ASSESSMENT AND PLAN:  1.  HTN   Improved continue to go to gym and lose weight  2. Edema:   Echo and LE duplex fine better of norvasc continue diuretic 3. CAD- risk low calcium score is 0 no need for further testing  4. Pre op: clear to have right TKR with Dr Noemi Chapel April 2019 with no further testing   Jenkins Rouge

## 2017-06-04 ENCOUNTER — Encounter: Payer: Self-pay | Admitting: Cardiovascular Disease

## 2017-06-04 ENCOUNTER — Ambulatory Visit: Payer: Managed Care, Other (non HMO) | Admitting: Cardiovascular Disease

## 2017-06-04 VITALS — BP 154/68 | HR 115 | Ht 64.0 in | Wt 211.2 lb

## 2017-06-04 DIAGNOSIS — I10 Essential (primary) hypertension: Secondary | ICD-10-CM | POA: Diagnosis not present

## 2017-06-04 DIAGNOSIS — I2510 Atherosclerotic heart disease of native coronary artery without angina pectoris: Secondary | ICD-10-CM | POA: Diagnosis not present

## 2017-06-04 DIAGNOSIS — Z01818 Encounter for other preprocedural examination: Secondary | ICD-10-CM | POA: Diagnosis not present

## 2017-06-04 NOTE — Patient Instructions (Addendum)
Medication Instructions:  Your physician recommends that you continue on your current medications as directed. Please refer to the Current Medication list given to you today.  Labwork: NONE  Testing/Procedures: NONE  Follow-Up: Your physician wants you to follow-up as needed.  If you need a refill on your cardiac medications before your next appointment, please call your pharmacy.   

## 2018-04-09 ENCOUNTER — Other Ambulatory Visit: Payer: Self-pay | Admitting: Obstetrics and Gynecology

## 2018-04-09 DIAGNOSIS — Z1231 Encounter for screening mammogram for malignant neoplasm of breast: Secondary | ICD-10-CM

## 2018-04-25 ENCOUNTER — Ambulatory Visit
Admission: RE | Admit: 2018-04-25 | Discharge: 2018-04-25 | Disposition: A | Discharge status: Home / Self Care | Payer: Managed Care, Other (non HMO) | Source: Ambulatory Visit | Attending: Obstetrics and Gynecology | Admitting: Obstetrics and Gynecology

## 2018-04-25 DIAGNOSIS — Z1231 Encounter for screening mammogram for malignant neoplasm of breast: Secondary | ICD-10-CM

## 2019-01-01 ENCOUNTER — Other Ambulatory Visit: Payer: Self-pay | Admitting: *Deleted

## 2019-01-01 DIAGNOSIS — Z20822 Contact with and (suspected) exposure to covid-19: Secondary | ICD-10-CM

## 2019-01-03 LAB — NOVEL CORONAVIRUS, NAA: SARS-CoV-2, NAA: NOT DETECTED

## 2019-03-07 ENCOUNTER — Other Ambulatory Visit: Payer: Self-pay

## 2019-03-07 DIAGNOSIS — Z20822 Contact with and (suspected) exposure to covid-19: Secondary | ICD-10-CM

## 2019-03-09 LAB — NOVEL CORONAVIRUS, NAA: SARS-CoV-2, NAA: DETECTED — AB

## 2019-03-24 ENCOUNTER — Other Ambulatory Visit: Payer: Self-pay

## 2019-03-24 DIAGNOSIS — Z20822 Contact with and (suspected) exposure to covid-19: Secondary | ICD-10-CM

## 2019-03-25 LAB — NOVEL CORONAVIRUS, NAA: SARS-CoV-2, NAA: NOT DETECTED

## 2019-07-18 ENCOUNTER — Other Ambulatory Visit: Payer: Self-pay | Admitting: Obstetrics and Gynecology

## 2019-07-18 DIAGNOSIS — Z1231 Encounter for screening mammogram for malignant neoplasm of breast: Secondary | ICD-10-CM

## 2019-07-28 ENCOUNTER — Ambulatory Visit: Payer: Managed Care, Other (non HMO) | Attending: Internal Medicine

## 2019-07-28 DIAGNOSIS — Z20822 Contact with and (suspected) exposure to covid-19: Secondary | ICD-10-CM

## 2019-07-29 LAB — SARS-COV-2, NAA 2 DAY TAT

## 2019-07-29 LAB — NOVEL CORONAVIRUS, NAA: SARS-CoV-2, NAA: NOT DETECTED

## 2019-07-30 ENCOUNTER — Ambulatory Visit: Payer: Managed Care, Other (non HMO)

## 2019-08-08 ENCOUNTER — Other Ambulatory Visit: Payer: Self-pay

## 2019-08-08 ENCOUNTER — Ambulatory Visit
Admission: RE | Admit: 2019-08-08 | Discharge: 2019-08-08 | Disposition: A | Discharge status: Home / Self Care | Payer: Managed Care, Other (non HMO) | Source: Ambulatory Visit | Attending: Obstetrics and Gynecology | Admitting: Obstetrics and Gynecology

## 2019-08-08 DIAGNOSIS — Z1231 Encounter for screening mammogram for malignant neoplasm of breast: Secondary | ICD-10-CM

## 2019-09-10 ENCOUNTER — Ambulatory Visit: Payer: Self-pay | Admitting: Podiatry

## 2019-09-23 ENCOUNTER — Telehealth: Payer: Self-pay | Admitting: *Deleted

## 2019-09-23 NOTE — Telephone Encounter (Signed)
Left message informing pt if she felt she was having another ingrown toenail or paronychia, she should begin soaking in epsom salt 1/4 cup in 1 quart warm water for 20 minutes twice daily and afterwards apply an antibiotic ointment bandaid perform daily until seen in office, if other type of problem call for assistance and also could call after 11:00am on most weekday and there may be cancellations.

## 2019-09-23 NOTE — Telephone Encounter (Signed)
Patient is having problems with sore toe and has an appointment scheduled for June 17th with Dr. Paulla Dolly. What should she do in the meantime?

## 2019-10-09 ENCOUNTER — Ambulatory Visit: Payer: Managed Care, Other (non HMO) | Admitting: Podiatry

## 2019-10-29 ENCOUNTER — Ambulatory Visit (INDEPENDENT_AMBULATORY_CARE_PROVIDER_SITE_OTHER): Payer: Managed Care, Other (non HMO)

## 2019-10-29 ENCOUNTER — Ambulatory Visit: Payer: Managed Care, Other (non HMO) | Admitting: Podiatry

## 2019-10-29 ENCOUNTER — Encounter: Payer: Self-pay | Admitting: Podiatry

## 2019-10-29 ENCOUNTER — Other Ambulatory Visit: Payer: Self-pay

## 2019-10-29 VITALS — Temp 97.6°F

## 2019-10-29 DIAGNOSIS — M2042 Other hammer toe(s) (acquired), left foot: Secondary | ICD-10-CM

## 2019-10-29 DIAGNOSIS — M2041 Other hammer toe(s) (acquired), right foot: Secondary | ICD-10-CM

## 2019-10-29 DIAGNOSIS — L84 Corns and callosities: Secondary | ICD-10-CM

## 2019-10-30 NOTE — Progress Notes (Signed)
Subjective:   Patient ID: [name], female   DOB: 64 y.o.   MRN: [id number]   HPI Patient presents concerned about lesions on the toes and also nail disease with the nails being thicker and discoloration of the left big toenail.  Patient states also concerned because the big toes are leading into the second digit    ROS      Objective:  Physical Exam  Neurovascular status intact with patient found to have keratotic lesion with digital deformity along with nail disease with probable trauma with no pain associated with the nail     Assessment:  Probability for keratotic lesion secondary to hammertoe deformity along with probability for traumatized nail with no indications currently of other pathology      Plan:  H&P reviewed conditions and I have recommended debridement which was accomplished today I discussed the nail discoloration and the possibility that there may be trauma and I wanted to grow out that if it were to start to become painful or the glycolated were to extend proximal we will get a biopsy but at this point I do think it is trauma and hopefully will grow out over the next few months.  Patient will be seen back as needed with both areas extensive education given

## 2020-04-24 HISTORY — PX: KNEE SURGERY: SHX244

## 2020-05-29 ENCOUNTER — Other Ambulatory Visit: Payer: Self-pay

## 2020-05-29 ENCOUNTER — Observation Stay (HOSPITAL_COMMUNITY): Payer: Managed Care, Other (non HMO)

## 2020-05-29 ENCOUNTER — Inpatient Hospital Stay (HOSPITAL_BASED_OUTPATIENT_CLINIC_OR_DEPARTMENT_OTHER)
Admission: EM | Admit: 2020-05-29 | Discharge: 2020-06-05 | DRG: 871 | Disposition: A | Discharge status: Home Health | Payer: Managed Care, Other (non HMO) | Attending: Internal Medicine | Admitting: Internal Medicine

## 2020-05-29 ENCOUNTER — Encounter (HOSPITAL_BASED_OUTPATIENT_CLINIC_OR_DEPARTMENT_OTHER): Payer: Self-pay | Admitting: Emergency Medicine

## 2020-05-29 ENCOUNTER — Emergency Department (HOSPITAL_BASED_OUTPATIENT_CLINIC_OR_DEPARTMENT_OTHER): Payer: Managed Care, Other (non HMO)

## 2020-05-29 DIAGNOSIS — K219 Gastro-esophageal reflux disease without esophagitis: Secondary | ICD-10-CM | POA: Diagnosis present

## 2020-05-29 DIAGNOSIS — Z8619 Personal history of other infectious and parasitic diseases: Secondary | ICD-10-CM

## 2020-05-29 DIAGNOSIS — U071 COVID-19: Secondary | ICD-10-CM | POA: Diagnosis present

## 2020-05-29 DIAGNOSIS — I10 Essential (primary) hypertension: Secondary | ICD-10-CM | POA: Diagnosis present

## 2020-05-29 DIAGNOSIS — E8809 Other disorders of plasma-protein metabolism, not elsewhere classified: Secondary | ICD-10-CM | POA: Diagnosis present

## 2020-05-29 DIAGNOSIS — N179 Acute kidney failure, unspecified: Secondary | ICD-10-CM | POA: Diagnosis present

## 2020-05-29 DIAGNOSIS — J1282 Pneumonia due to coronavirus disease 2019: Secondary | ICD-10-CM

## 2020-05-29 DIAGNOSIS — E871 Hypo-osmolality and hyponatremia: Secondary | ICD-10-CM | POA: Diagnosis present

## 2020-05-29 DIAGNOSIS — F419 Anxiety disorder, unspecified: Secondary | ICD-10-CM | POA: Diagnosis present

## 2020-05-29 DIAGNOSIS — Z6836 Body mass index (BMI) 36.0-36.9, adult: Secondary | ICD-10-CM

## 2020-05-29 DIAGNOSIS — J90 Pleural effusion, not elsewhere classified: Secondary | ICD-10-CM

## 2020-05-29 DIAGNOSIS — Z8249 Family history of ischemic heart disease and other diseases of the circulatory system: Secondary | ICD-10-CM

## 2020-05-29 DIAGNOSIS — Z79899 Other long term (current) drug therapy: Secondary | ICD-10-CM

## 2020-05-29 DIAGNOSIS — Z833 Family history of diabetes mellitus: Secondary | ICD-10-CM

## 2020-05-29 DIAGNOSIS — F325 Major depressive disorder, single episode, in full remission: Secondary | ICD-10-CM

## 2020-05-29 DIAGNOSIS — Z96652 Presence of left artificial knee joint: Secondary | ICD-10-CM | POA: Diagnosis present

## 2020-05-29 DIAGNOSIS — I5030 Unspecified diastolic (congestive) heart failure: Secondary | ICD-10-CM | POA: Diagnosis present

## 2020-05-29 DIAGNOSIS — Z791 Long term (current) use of non-steroidal anti-inflammatories (NSAID): Secondary | ICD-10-CM

## 2020-05-29 DIAGNOSIS — J45909 Unspecified asthma, uncomplicated: Secondary | ICD-10-CM | POA: Diagnosis present

## 2020-05-29 DIAGNOSIS — R7989 Other specified abnormal findings of blood chemistry: Secondary | ICD-10-CM | POA: Diagnosis not present

## 2020-05-29 DIAGNOSIS — E876 Hypokalemia: Secondary | ICD-10-CM | POA: Diagnosis not present

## 2020-05-29 DIAGNOSIS — A403 Sepsis due to Streptococcus pneumoniae: Principal | ICD-10-CM | POA: Diagnosis present

## 2020-05-29 DIAGNOSIS — R0781 Pleurodynia: Secondary | ICD-10-CM | POA: Diagnosis not present

## 2020-05-29 DIAGNOSIS — E872 Acidosis, unspecified: Secondary | ICD-10-CM

## 2020-05-29 DIAGNOSIS — J13 Pneumonia due to Streptococcus pneumoniae: Secondary | ICD-10-CM | POA: Diagnosis present

## 2020-05-29 DIAGNOSIS — I5033 Acute on chronic diastolic (congestive) heart failure: Secondary | ICD-10-CM | POA: Diagnosis present

## 2020-05-29 DIAGNOSIS — F32A Depression, unspecified: Secondary | ICD-10-CM | POA: Diagnosis present

## 2020-05-29 DIAGNOSIS — Z888 Allergy status to other drugs, medicaments and biological substances status: Secondary | ICD-10-CM

## 2020-05-29 DIAGNOSIS — R0602 Shortness of breath: Secondary | ICD-10-CM

## 2020-05-29 DIAGNOSIS — Z538 Procedure and treatment not carried out for other reasons: Secondary | ICD-10-CM | POA: Diagnosis present

## 2020-05-29 DIAGNOSIS — I110 Hypertensive heart disease with heart failure: Secondary | ICD-10-CM | POA: Diagnosis present

## 2020-05-29 DIAGNOSIS — Z825 Family history of asthma and other chronic lower respiratory diseases: Secondary | ICD-10-CM

## 2020-05-29 DIAGNOSIS — Z823 Family history of stroke: Secondary | ICD-10-CM

## 2020-05-29 HISTORY — DX: Hypokalemia: E87.6

## 2020-05-29 LAB — CBC WITH DIFFERENTIAL/PLATELET
Abs Immature Granulocytes: 0.07 10*3/uL (ref 0.00–0.07)
Basophils Absolute: 0 10*3/uL (ref 0.0–0.1)
Basophils Relative: 0 %
Eosinophils Absolute: 0 10*3/uL (ref 0.0–0.5)
Eosinophils Relative: 0 %
HCT: 35.4 % — ABNORMAL LOW (ref 36.0–46.0)
Hemoglobin: 11.9 g/dL — ABNORMAL LOW (ref 12.0–15.0)
Immature Granulocytes: 1 %
Lymphocytes Relative: 6 %
Lymphs Abs: 0.5 10*3/uL — ABNORMAL LOW (ref 0.7–4.0)
MCH: 29 pg (ref 26.0–34.0)
MCHC: 33.6 g/dL (ref 30.0–36.0)
MCV: 86.3 fL (ref 80.0–100.0)
Monocytes Absolute: 0.2 10*3/uL (ref 0.1–1.0)
Monocytes Relative: 3 %
Neutro Abs: 7.8 10*3/uL — ABNORMAL HIGH (ref 1.7–7.7)
Neutrophils Relative %: 90 %
Platelets: 176 10*3/uL (ref 150–400)
RBC: 4.1 MIL/uL (ref 3.87–5.11)
RDW: 14 % (ref 11.5–15.5)
Smear Review: NORMAL
WBC: 8.6 10*3/uL (ref 4.0–10.5)
nRBC: 0 % (ref 0.0–0.2)

## 2020-05-29 LAB — COMPREHENSIVE METABOLIC PANEL
ALT: 21 U/L (ref 0–44)
AST: 22 U/L (ref 15–41)
Albumin: 2.9 g/dL — ABNORMAL LOW (ref 3.5–5.0)
Alkaline Phosphatase: 53 U/L (ref 38–126)
Anion gap: 15 (ref 5–15)
BUN: 33 mg/dL — ABNORMAL HIGH (ref 8–23)
CO2: 23 mmol/L (ref 22–32)
Calcium: 7.8 mg/dL — ABNORMAL LOW (ref 8.9–10.3)
Chloride: 97 mmol/L — ABNORMAL LOW (ref 98–111)
Creatinine, Ser: 2.05 mg/dL — ABNORMAL HIGH (ref 0.44–1.00)
GFR, Estimated: 27 mL/min — ABNORMAL LOW (ref >60)
Glucose, Bld: 132 mg/dL — ABNORMAL HIGH (ref 70–99)
Potassium: 3.1 mmol/L — ABNORMAL LOW (ref 3.5–5.1)
Sodium: 135 mmol/L (ref 135–145)
Total Bilirubin: 1.1 mg/dL (ref 0.3–1.2)
Total Protein: 6.2 g/dL — ABNORMAL LOW (ref 6.5–8.1)

## 2020-05-29 LAB — PHOSPHORUS: Phosphorus: 3.5 mg/dL (ref 2.5–4.6)

## 2020-05-29 LAB — MAGNESIUM
Magnesium: 1.3 mg/dL — ABNORMAL LOW (ref 1.7–2.4)
Magnesium: 1.7 mg/dL (ref 1.7–2.4)

## 2020-05-29 LAB — PROCALCITONIN: Procalcitonin: 23.17 ng/mL

## 2020-05-29 LAB — BASIC METABOLIC PANEL
Anion gap: 18 — ABNORMAL HIGH (ref 5–15)
BUN: 32 mg/dL — ABNORMAL HIGH (ref 8–23)
CO2: 24 mmol/L (ref 22–32)
Calcium: 8.3 mg/dL — ABNORMAL LOW (ref 8.9–10.3)
Chloride: 91 mmol/L — ABNORMAL LOW (ref 98–111)
Creatinine, Ser: 2.56 mg/dL — ABNORMAL HIGH (ref 0.44–1.00)
GFR, Estimated: 20 mL/min — ABNORMAL LOW (ref >60)
Glucose, Bld: 201 mg/dL — ABNORMAL HIGH (ref 70–99)
Potassium: 2.7 mmol/L — CL (ref 3.5–5.1)
Sodium: 133 mmol/L — ABNORMAL LOW (ref 135–145)

## 2020-05-29 LAB — TROPONIN I (HIGH SENSITIVITY)
Troponin I (High Sensitivity): 18 ng/L — ABNORMAL HIGH (ref <18)
Troponin I (High Sensitivity): 17 ng/L (ref <18)

## 2020-05-29 LAB — TRIGLYCERIDES: Triglycerides: 114 mg/dL (ref <150)

## 2020-05-29 LAB — FIBRINOGEN: Fibrinogen: >800 mg/dL — ABNORMAL HIGH (ref 210–475)

## 2020-05-29 LAB — HIV ANTIBODY (ROUTINE TESTING W REFLEX): HIV Screen 4th Generation wRfx: NONREACTIVE

## 2020-05-29 LAB — D-DIMER, QUANTITATIVE: D-Dimer, Quant: 0.69 ug/mL-FEU — ABNORMAL HIGH (ref 0.00–0.50)

## 2020-05-29 LAB — FERRITIN: Ferritin: 472 ng/mL — ABNORMAL HIGH (ref 11–307)

## 2020-05-29 LAB — LACTIC ACID, PLASMA
Lactic Acid, Venous: 3.8 mmol/L (ref 0.5–1.9)
Lactic Acid, Venous: 2.9 mmol/L (ref 0.5–1.9)

## 2020-05-29 LAB — SARS CORONAVIRUS 2 BY RT PCR (HOSPITAL ORDER, PERFORMED IN ~~LOC~~ HOSPITAL LAB): SARS Coronavirus 2: POSITIVE — AB

## 2020-05-29 LAB — C-REACTIVE PROTEIN: CRP: 39.3 mg/dL — ABNORMAL HIGH (ref <1.0)

## 2020-05-29 LAB — LACTATE DEHYDROGENASE: LDH: 209 U/L — ABNORMAL HIGH (ref 98–192)

## 2020-05-29 MED ORDER — SODIUM CHLORIDE 0.9 % IV BOLUS
500.0000 mL | Freq: Once | INTRAVENOUS | Status: AC
  Administered 2020-05-29: 500 mL via INTRAVENOUS

## 2020-05-29 MED ORDER — HYDROCOD POLST-CPM POLST ER 10-8 MG/5ML PO SUER
5.0000 mL | Freq: Two times a day (BID) | ORAL | Status: DC | PRN
  Administered 2020-05-30 – 2020-06-04 (×7): 5 mL via ORAL
  Filled 2020-05-29 (×7): qty 5

## 2020-05-29 MED ORDER — POTASSIUM CHLORIDE CRYS ER 20 MEQ PO TBCR
20.0000 meq | EXTENDED_RELEASE_TABLET | Freq: Once | ORAL | Status: AC
  Administered 2020-05-29: 20 meq via ORAL
  Filled 2020-05-29: qty 1

## 2020-05-29 MED ORDER — POTASSIUM CHLORIDE CRYS ER 20 MEQ PO TBCR
40.0000 meq | EXTENDED_RELEASE_TABLET | Freq: Once | ORAL | Status: AC
  Administered 2020-05-29: 40 meq via ORAL
  Filled 2020-05-29: qty 2

## 2020-05-29 MED ORDER — POTASSIUM CHLORIDE 10 MEQ/100ML IV SOLN
10.0000 meq | INTRAVENOUS | Status: AC
  Administered 2020-05-29 (×2): 10 meq via INTRAVENOUS
  Filled 2020-05-29: qty 100

## 2020-05-29 MED ORDER — SODIUM CHLORIDE 0.9 % IV SOLN: Freq: Once | INTRAVENOUS | Status: AC

## 2020-05-29 MED ORDER — ACETAMINOPHEN 500 MG PO TABS
1000.0000 mg | ORAL_TABLET | Freq: Once | ORAL | Status: AC
  Administered 2020-05-29: 1000 mg via ORAL
  Filled 2020-05-29: qty 2

## 2020-05-29 MED ORDER — SODIUM CHLORIDE 0.9 % IV SOLN
INTRAVENOUS | Status: AC
  Administered 2020-05-30: 1000 mL via INTRAVENOUS

## 2020-05-29 MED ORDER — ENSURE ENLIVE PO LIQD: 237.0000 mL | Freq: Two times a day (BID) | ORAL | Status: DC

## 2020-05-29 MED ORDER — SODIUM CHLORIDE 0.9 % IV SOLN: 250.0000 mL | INTRAVENOUS | Status: DC | PRN

## 2020-05-29 MED ORDER — SODIUM CHLORIDE 0.9% FLUSH
3.0000 mL | Freq: Two times a day (BID) | INTRAVENOUS | Status: DC
  Administered 2020-05-30 – 2020-06-05 (×12): 3 mL via INTRAVENOUS

## 2020-05-29 MED ORDER — SODIUM CHLORIDE 0.9% FLUSH: 3.0000 mL | INTRAVENOUS | Status: DC | PRN

## 2020-05-29 MED ORDER — OXYCODONE HCL 5 MG PO TABS
5.0000 mg | ORAL_TABLET | Freq: Four times a day (QID) | ORAL | Status: DC | PRN
  Administered 2020-05-30 – 2020-06-02 (×6): 5 mg via ORAL
  Filled 2020-05-29 (×6): qty 1

## 2020-05-29 MED ORDER — GABAPENTIN 100 MG PO CAPS
100.0000 mg | ORAL_CAPSULE | Freq: Two times a day (BID) | ORAL | Status: DC
  Administered 2020-05-29 – 2020-06-05 (×14): 100 mg via ORAL
  Filled 2020-05-29 (×14): qty 1

## 2020-05-29 MED ORDER — ACETAMINOPHEN 325 MG PO TABS
650.0000 mg | ORAL_TABLET | Freq: Four times a day (QID) | ORAL | Status: DC | PRN
  Administered 2020-05-30: 650 mg via ORAL
  Filled 2020-05-29: qty 2

## 2020-05-29 MED ORDER — BENZONATATE 100 MG PO CAPS
100.0000 mg | ORAL_CAPSULE | Freq: Three times a day (TID) | ORAL | Status: DC | PRN
  Administered 2020-05-30: 100 mg via ORAL
  Filled 2020-05-29: qty 1

## 2020-05-29 MED ORDER — HYDROCOD POLST-CPM POLST ER 10-8 MG/5ML PO SUER
5.0000 mL | Freq: Once | ORAL | Status: AC
  Administered 2020-05-29: 5 mL via ORAL
  Filled 2020-05-29: qty 5

## 2020-05-29 MED ORDER — GUAIFENESIN 100 MG/5ML PO SOLN
5.0000 mL | ORAL | Status: DC | PRN
  Administered 2020-05-29: 100 mg via ORAL
  Filled 2020-05-29: qty 5

## 2020-05-29 MED ORDER — ENOXAPARIN SODIUM 40 MG/0.4ML ~~LOC~~ SOLN
40.0000 mg | SUBCUTANEOUS | Status: DC
  Administered 2020-05-29 – 2020-06-04 (×7): 40 mg via SUBCUTANEOUS
  Filled 2020-05-29 (×7): qty 0.4

## 2020-05-29 MED ORDER — ONDANSETRON HCL 4 MG/2ML IJ SOLN: 4.0000 mg | Freq: Four times a day (QID) | INTRAMUSCULAR | Status: DC | PRN

## 2020-05-29 MED ORDER — VENLAFAXINE HCL ER 37.5 MG PO CP24
37.5000 mg | ORAL_CAPSULE | Freq: Every day | ORAL | Status: DC
  Administered 2020-05-30 – 2020-06-05 (×7): 37.5 mg via ORAL
  Filled 2020-05-29 (×8): qty 1

## 2020-05-29 MED ORDER — ALBUTEROL SULFATE HFA 108 (90 BASE) MCG/ACT IN AERS
1.0000 | INHALATION_SPRAY | RESPIRATORY_TRACT | Status: DC | PRN
  Administered 2020-05-29 – 2020-05-31 (×6): 2 via RESPIRATORY_TRACT
  Filled 2020-05-29: qty 6.7

## 2020-05-29 MED ORDER — ONDANSETRON HCL 4 MG PO TABS: 4.0000 mg | ORAL_TABLET | Freq: Four times a day (QID) | ORAL | Status: DC | PRN

## 2020-05-29 MED ORDER — MAGNESIUM SULFATE 2 GM/50ML IV SOLN
2.0000 g | Freq: Once | INTRAVENOUS | Status: AC
  Administered 2020-05-29: 2 g via INTRAVENOUS
  Filled 2020-05-29: qty 50

## 2020-05-29 NOTE — ED Triage Notes (Signed)
COVID+, reports ongoing SOB and generalized weakness.

## 2020-05-29 NOTE — Plan of Care (Signed)

## 2020-05-29 NOTE — Progress Notes (Signed)
Paged admission twice to know who will be patient's doctor. Patient with MEWS yellow due to HR in high 130s.

## 2020-05-29 NOTE — ED Notes (Signed)
Earnest Bailey, patient's daughter notified of the Room assignment.

## 2020-05-29 NOTE — ED Notes (Signed)
EKG complete, handed to EDP. Pt in gown, on monitor

## 2020-05-29 NOTE — ED Notes (Signed)
Ambulated on r/a SpO2 95-98%, HR max 135, RR high 20s.  + DOE, BBS Rhonchi.

## 2020-05-29 NOTE — ED Notes (Signed)
ED Provider at bedside. 

## 2020-05-29 NOTE — Progress Notes (Signed)
Unable to complete MEWS VS for 2120 d/t pt unavailability as pt is currently off the unit.

## 2020-05-29 NOTE — Significant Event (Signed)
Per NM, pt unable to complete VQ scan d/t pt's inability to lie flat.

## 2020-05-29 NOTE — ED Provider Notes (Signed)
[facility] Provider Note   CSN: [id number] Arrival date & time: 05/29/20  1011     History Chief Complaint  Patient presents with  . Shortness of Breath    [name] is a 65 y.o. female.  Patient is here with COVID-19, various complaints.  Has been having symptoms since Monday.  Tested positive for at home test.  Has been having a nonproductive cough, congestion as well as shortness of breath.  Breathing worse with exertion.  Improved with rest.  No associated chest pain.  Has had poor appetite, some nausea but no vomiting.  Has had generalized body aches.  Unvaccinated.  Primary doctor started her on Paxlovid.  Non-smoker.  Reports history of hypertension.  In the beginning of January she had a knee surgery, was on Xarelto until yesterday for DVT/PE prophylaxis.   HPI     Past Medical History:  Diagnosis Date  . Asthma   . Depression   . Erosive esophagitis   . Headaches, cluster   . Hepatitis C   . HTN (hypertension)     Patient Active Problem List   Diagnosis Date Noted  . HTN (hypertension)   . Depression   . Asthma     Past Surgical History:  Procedure Laterality Date  . NO PAST SURGERIES       OB History   No obstetric history on file.     Family History  Problem Relation Age of Onset  . Emphysema Father   . Coronary artery disease Mother        CABG  . Diabetes Mellitus II Mother   . Heart disease Mother   . Stroke Brother   . Heart attack Sister   . Crohn's disease Sister   . Liver disease Sister   . Heart disease Brother   . Colitis Brother   . Heart disease Brother   . Irritable bowel syndrome Daughter   . Bipolar disorder Daughter   . Breast cancer Neg Hx     Social History   Tobacco Use  . Smoking status: Never Smoker  . Smokeless tobacco: Never Used  Substance Use Topics  . Alcohol use: Not Currently    Alcohol/week: 0.0 standard drinks    Home Medications Prior to Admission medications    Medication Sig Start Date End Date Taking? Authorizing Provider  Calcium Carb-Cholecalciferol (CALCIUM-VITAMIN D3) 600-400 MG-UNIT TABS Take 1 tablet by mouth daily.    [provider]  chlorthalidone (HYGROTON) 25 MG tablet Take 25 mg by mouth daily. 08/15/19   [provider]  irbesartan (AVAPRO) 150 MG tablet Take 1 tablet by mouth daily. 05/22/17   [provider]  meloxicam (MOBIC) 15 MG tablet Take 15 mg by mouth daily. For arthritis    [provider]  Multiple Vitamins-Minerals (MULTIVITAMIN PO) Take 1 tablet by mouth daily.    [provider]  omeprazole (PRILOSEC) 40 MG capsule Take 40 mg by mouth daily. 10/14/15   [provider]  Venlafaxine HCl 75 MG TB24 Take 37.5 mg by mouth daily.     [provider]    Allergies    Atenolol, Lisinopril, Paxil [paroxetine hcl], Zoloft [sertraline hcl], and Elemental sulfur  Review of Systems   Review of Systems  Constitutional: Positive for chills and fatigue. Negative for fever.  HENT: Positive for sinus pressure. Negative for ear pain and sore throat.   Eyes: Negative for pain and visual disturbance.  Respiratory: Positive for cough and shortness  of breath.   Cardiovascular: Negative for chest pain and palpitations.  Gastrointestinal: Negative for abdominal pain and vomiting.  Genitourinary: Negative for dysuria and hematuria.  Musculoskeletal: Positive for arthralgias and myalgias. Negative for back pain.  Skin: Negative for color change and rash.  Neurological: Negative for seizures and syncope.  All other systems reviewed and are negative.   Physical Exam Updated Vital Signs BP 104/68   Pulse (!) 111   Temp 98.4 F (36.9 C) (Oral)   Resp (!) 21   Ht 5\' 4"  (1.626 m)   Wt 96.6 kg   SpO2 96%   BMI 36.56 kg/m   Physical Exam Vitals and nursing note reviewed.  Constitutional:      General: She is not in acute distress.    Appearance: She is well-developed and  well-nourished.  HENT:     Head: Normocephalic and atraumatic.  Eyes:     Conjunctiva/sclera: Conjunctivae normal.  Cardiovascular:     Rate and Rhythm: Regular rhythm. Tachycardia present.     Heart sounds: No murmur heard.   Pulmonary:     Comments: Rhonchorous breath sounds, worse on left, mild tachypnea but no distress Abdominal:     Palpations: Abdomen is soft.     Tenderness: There is no abdominal tenderness.  Musculoskeletal:        General: No edema.     Cervical back: Neck supple.  Skin:    General: Skin is warm and dry.  Neurological:     General: No focal deficit present.     Mental Status: She is alert.  Psychiatric:        Mood and Affect: Mood and affect and mood normal.        Behavior: Behavior normal.     ED Results / Procedures / Treatments   Labs (all labs ordered are listed, but only abnormal results are displayed) Labs Reviewed  CBC WITH DIFFERENTIAL/PLATELET - Abnormal; Notable for the following components:      Result Value   Hemoglobin 11.9 (*)    HCT 35.4 (*)    Neutro Abs 7.8 (*)    Lymphs Abs 0.5 (*)    All other components within normal limits  BASIC METABOLIC PANEL - Abnormal; Notable for the following components:   Sodium 133 (*)    Potassium 2.7 (*)    Chloride 91 (*)    Glucose, Bld 201 (*)    BUN 32 (*)    Creatinine, Ser 2.56 (*)    Calcium 8.3 (*)    GFR, Estimated 20 (*)    Anion gap 18 (*)    All other components within normal limits  D-DIMER, QUANTITATIVE (NOT AT [facility]) - Abnormal; Notable for the following components:   D-Dimer, Quant 0.69 (*)    All other components within normal limits  TROPONIN I (HIGH SENSITIVITY) - Abnormal; Notable for the following components:   Troponin I (High Sensitivity) 18 (*)    All other components within normal limits  SARS CORONAVIRUS 2 BY RT PCR (HOSPITAL ORDER, [place] [facility])  CULTURE, BLOOD (ROUTINE X 2)  CULTURE, BLOOD (ROUTINE X 2)  LACTIC ACID, PLASMA   PROCALCITONIN  LACTATE DEHYDROGENASE  FERRITIN  TRIGLYCERIDES  FIBRINOGEN  C-REACTIVE PROTEIN  MAGNESIUM  URINALYSIS, ROUTINE W REFLEX MICROSCOPIC  TROPONIN I (HIGH SENSITIVITY)    EKG EKG Interpretation  Date/Time:  Saturday May 29 2020 10:27:33 EST Ventricular Rate:  119 PR Interval:    QRS Duration: 97 QT Interval:  337 QTC Calculation: 475 R Axis:   14 Text Interpretation: Sinus tachycardia Consider right atrial enlargement Low voltage, precordial leads Abnormal R-wave progression, early transition Confirmed by Madalyn Rob [phone]) on 05/29/2020 10:41:14 AM   Radiology DG Chest Portable 1 View  Result Date: 05/29/2020 CLINICAL DATA:  COVID positive.  Shortness of breath.  Weakness. EXAM: PORTABLE CHEST 1 VIEW COMPARISON:  Two-view chest x-ray 01/13/2015 FINDINGS: Heart size is normal. Hilar fullness present bilaterally. Asymmetric left lower lobe airspace opacities are present. Upper lung fields are clear. IMPRESSION: 1. Asymmetric left lower lobe airspace disease compatible with pneumonia. 2. Bilateral hilar fullness. Underlying adenopathy is not excluded. Electronically Signed   By: San Morelle M.D.   On: 05/29/2020 11:35    Procedures Procedures   Medications Ordered in ED Medications  potassium chloride 10 mEq in 100 mL IVPB (has no administration in time range)  0.9 %  sodium chloride infusion (has no administration in time range)  sodium chloride 0.9 % bolus 500 mL (500 mLs Intravenous New Bag/Given 05/29/20 1106)  chlorpheniramine-HYDROcodone (TUSSIONEX) 10-8 MG/5ML suspension 5 mL (5 mLs Oral Given 05/29/20 1122)  acetaminophen (TYLENOL) tablet 1,000 mg (1,000 mg Oral Given 05/29/20 1230)  potassium chloride SA (KLOR-CON) CR tablet 40 mEq (40 mEq Oral Given 05/29/20 1230)    ED Course  I have reviewed the triage vital signs and the nursing notes.  Pertinent labs & imaging results that were available during my care of the patient were reviewed by me  and considered in my medical decision making (see chart for details).    MDM Rules/Calculators/A&P                         65 year old lady presents to ER with concern for worsening shortness of breath in the setting of known COVID-19.  On exam, patient noted to have mild tachypnea, some rhonchorous breath sounds and sinus tachycardia.  CXR consistent with pneumonia, suspect secondary to COVID-19.  BMP concerning for AKI, hypokalemia.  Suspect secondary to dehydration in setting of COVID-19.  D-dimer very mildly elevated -given patient was on Xarelto for DVT prophylaxis until yesterday, no associated chest pain, low suspicion for acute pulmonary embolism at this time.  Started patient on IV rehydration, supplementation at home.  Given her overall clinical picture, believe she would benefit from inpatient management at this time.  Will consult hospitalist for admission.  Final Clinical Impression(s) / ED Diagnoses Final diagnoses:  COVID-19  Pneumonia due to COVID-19 virus  AKI (acute kidney injury) ([place])  Hypokalemia    Rx / DC Orders ED Discharge Orders    None       Lucrezia Starch, MD 05/29/20 1247

## 2020-05-29 NOTE — Plan of Care (Signed)
Patient is a 65 year old woman with a PMH of HTN, hepatitis C and depression who presented with shortness of breath and decreased oral intake.  Patient reports that she started have Covid-like symptoms on Monday, 05/24/2020.  Her home Covid test was positive and she was started on Paxlovid by her PCP. However, she reports worsening SOB and came to ED at [facility].  She is afebrile with RR 20s, P 110-120's and stable BPs.  O2 sats 95 to 97% on room air.  Labs showed potassium 2.7, magnesium 1.3, BUN 32, creatinine 2.56, lactic acid 3.8, nonrevealing CBC.  D-dimer 0.69 which is mildly elevated than age-adjusted D-dimer. COVID test is pending. CXR showed LLL pneumonia. She is accepted to observation COVID tele bed.   Of note, patient completed postop Xarelto on 05/28/2020 for recent knee surgery.    Dr. Charlann Lange At 1330 pm

## 2020-05-29 NOTE — ED Notes (Signed)
Date and time results received: 05/29/20 1144   Test: potassium Critical Value: 2.7  Name of Provider Notified: Dykstra Orders Received? Or Actions Taken?: no orders given

## 2020-05-29 NOTE — Progress Notes (Addendum)
Patient trasfered from [facility] to 5W09 via [place]; alert and oriented x 4; no complaints of pain; IV saline locked in LAC and fluids running in RAC; skin intact, old surgical scar on left knee. Orient patient to room and unit; gave patient care guide; instructed how to use the call bell and  fall risk precautions. Tele monitor placed per MD order. Will continue to monitor the patient.

## 2020-05-29 NOTE — Progress Notes (Signed)
   05/29/20 1720  Assess: MEWS Score  Temp 97.7 F (36.5 C)  BP 107/65  Pulse Rate (!) 123  ECG Heart Rate (!) 124  Resp 20  SpO2 93 %  O2 Device Room Air  Assess: MEWS Score  MEWS Temp 0  MEWS Systolic 0  MEWS Pulse 2  MEWS RR 0  MEWS LOC 0  MEWS Score 2  MEWS Score Color Yellow  Assess: if the MEWS score is Yellow or Red  Were vital signs taken at a resting state? Yes  Focused Assessment No change from prior assessment  Early Detection of Sepsis Score *See Row Information* Medium  MEWS guidelines implemented *See Row Information* Yes  Treat  MEWS Interventions Escalated (See documentation below)  Take Vital Signs  Increase Vital Sign Frequency  Yellow: Q 2hr X 2 then Q 4hr X 2, if remains yellow, continue Q 4hrs  Escalate  MEWS: Escalate Yellow: discuss with charge nurse/RN and consider discussing with provider and RRT  Notify: Charge Nurse/RN  Name of Charge Nurse/RN Notified Donnetta Simpers, RN  Date Charge Nurse/RN Notified 05/29/20  Time Charge Nurse/RN Notified 1720  Notify: Provider  Provider Name/Title Dr. Nicoletta Dress, Na  Date Provider Notified 05/29/20  Time Provider Notified 1730  Notification Type Page  Notification Reason Other (Comment) (HR elevated)  Notify: Rapid Response  Name of Rapid Response RN Notified N/A  Document  Progress note created (see row info) Yes

## 2020-05-29 NOTE — ED Notes (Signed)
Hospitalist consult called via carelink, spoke with Joelene Millin

## 2020-05-29 NOTE — ED Notes (Signed)
Date and time results received: 05/29/20 1330   Test: lactic acid Critical Value: 3.8 Name of Provider Notified: Dykstra  Orders Received? Or Actions Taken?: no orders given

## 2020-05-29 NOTE — H&P (Signed)
History and Physical    [name] ZJQ:[id number] DOB: 27-Feb-1956 DOA: 05/29/2020  PCP: Lujean Amel, MD Patient coming from: Home Chief Complaint: Fever, cough and shortness of breath  HPI: [name] is a 65 y.o. female with medical history significant of hypertension, hepatitis C, asthma, and depression who presented with a fever, cough and shortness of breath.  Patient reports that she started to have fevers with T-max 103F, chills, nonproductive cough, shortness of breath, diarrhea and decreased oral intake on Monday, 05/24/2020.  Home test was positive for Covid on the same day.  She lives with her family who all have Covid. Patient did not have vaccines for COVID.  Patient's PCP prescribed her Paxlovid and she started take first dose in the evening of Wednesday, 05/26/2020.  However, her symptoms progressively worsened over last couple days.  Patient went to [facility] ED for further evaluation today.  In the emergency room, she was afebrile with pulse 120s, RR 20s, BP 159/79 and room air O2 sats 97%.  The labs showed sodium 133, potassium 2.7, magnesium 1.3, BUN 32, creatinine 2.56, D-dimer 0.69, lactic acid 3.8, nonrevealing CBC.  Covid was positive.  EKG showed sinus tachycardia. CXR showed LLL infiltrates.  She received IV fluids, potassium and magnesium supplement.  Patient was transferred to [facility] for further management.   Review of Systems: As per HPI otherwise 10 point review of systems negative.  Review of Systems Otherwise negative except as per HPI, including: General: Positive for fever, chills, decreased appetite.  Denies night sweats or unintended weight loss. Resp: Positive for cough and shortness of breath. Cardiac: Denies chest pain, palpitations, orthopnea, paroxysmal nocturnal dyspnea. GI: Denies abdominal pain, nausea, vomiting, or constipation.  Positive for diarrhea and decreased oral intake GU: Denies dysuria, frequency, hesitancy or  incontinence MS: Denies muscle aches, joint pain or swelling Neuro: Denies headache, neurologic deficits (focal weakness, numbness, tingling), abnormal gait Psych: Denies anxiety, depression, SI/HI/AVH Skin: Denies new rashes or lesions ID: Denies sick contacts, exotic exposures, travel  Past Medical History:  Diagnosis Date  . Asthma   . Depression   . Erosive esophagitis   . Headaches, cluster   . Hepatitis C   . HTN (hypertension)     Past Surgical History:  Procedure Laterality Date  . NO PAST SURGERIES      SOCIAL HISTORY:  reports that she has never smoked. She has never used smokeless tobacco. She reports previous alcohol use. No history on file for drug use.  Allergies  Allergen Reactions  . Atenolol     FATIGUE/ HYPOTENSION  . Lisinopril     COUGH/ WHEEZING  . Paxil [Paroxetine Hcl]     INCREASED APPETITE FOR SWEETS  . Zoloft [Sertraline Hcl]     TINNITUS  . Elemental Sulfur Rash    FEVER    FAMILY HISTORY: Family History  Problem Relation Age of Onset  . Emphysema Father   . Coronary artery disease Mother        CABG  . Diabetes Mellitus II Mother   . Heart disease Mother   . Stroke Brother   . Heart attack Sister   . Crohn's disease Sister   . Liver disease Sister   . Heart disease Brother   . Colitis Brother   . Heart disease Brother   . Irritable bowel syndrome Daughter   . Bipolar disorder Daughter   . Breast cancer Neg Hx      Prior to Admission medications   Medication  Sig Start Date End Date Taking? Authorizing Provider  baclofen (LIORESAL) 10 MG tablet Take 1 tablet by mouth daily as needed for muscle spasms. 04/29/20  Yes [provider]  Calcium Carb-Cholecalciferol (CALCIUM-VITAMIN D3) 600-400 MG-UNIT TABS Take 1 tablet by mouth daily.   Yes [provider]  chlorthalidone (HYGROTON) 25 MG tablet Take 25 mg by mouth daily. 08/15/19  Yes [provider]  irbesartan (AVAPRO) 150 MG tablet Take 1 tablet by  mouth daily. 05/22/17  Yes [provider]  Multiple Vitamins-Minerals (MULTIVITAMIN PO) Take 1 tablet by mouth daily.   Yes [provider]  naproxen (NAPROSYN) 500 MG tablet Take 500 mg by mouth 2 (two) times daily. 04/19/20  Yes [provider]  naproxen sodium (ALEVE) 220 MG tablet Take 220 mg by mouth 2 (two) times daily as needed (pain).   Yes [provider]  omeprazole (PRILOSEC) 40 MG capsule Take 40 mg by mouth daily. 10/14/15  Yes [provider]  ondansetron (ZOFRAN) 4 MG tablet Take 4 mg by mouth every 8 (eight) hours as needed for nausea. 04/29/20  Yes [provider]  oxyCODONE (OXY IR/ROXICODONE) 5 MG immediate release tablet Take 5 mg by mouth 4 (four) times daily as needed for moderate pain. 05/17/20  Yes [provider]  PAXLOVID 20 x 150 MG & 10 x 100MG  TBPK Take 1 tablet by mouth daily.   Yes [provider]  sennosides-docusate sodium (SENOKOT-S) 8.6-50 MG tablet Take 1 tablet by mouth as needed (when taking pain medication). 04/29/20  Yes [provider]  Venlafaxine HCl 75 MG TB24 Take 37.5 mg by mouth daily.    Yes [provider]  gabapentin (NEURONTIN) 100 MG capsule Take 100 mg by mouth in the morning, at noon, and at bedtime. 05/01/20   [provider]    Physical Exam: Vitals:   05/29/20 1530 05/29/20 1720 05/29/20 1925 05/29/20 1934  BP: 93/68 107/65  120/71  Pulse: (!) 111 (!) 123  (!) 124  Resp: 19 20  20   Temp:  97.7 F (36.5 C)  (!) 97.5 F (36.4 C)  TempSrc:  Oral  Oral  SpO2: 97% 93% 95% 95%  Weight:      Height:          Constitutional: NAD, calm, comfortable.  Acute ill-appearing. Eyes: PERRL, lids and conjunctivae normal ENMT: Mucous membranes are moist. Posterior pharynx clear of any exudate or lesions.Normal dentition.  Neck: normal, supple, no masses, no thyromegaly Respiratory: Bilateral lung sounds diminished.  No wheezing, no crackles. Normal  respiratory effort. No accessory muscle use.  Cardiovascular: Regular rate and rhythm, no murmurs / rubs / gallops. No extremity edema. 2+ pedal pulses. No carotid bruits.  Abdomen: no tenderness, no masses palpated. No hepatosplenomegaly. Bowel sounds positive.  Musculoskeletal: no clubbing / cyanosis. No joint deformity upper and lower extremities. Good ROM, no contractures. Normal muscle tone.  Skin: no rashes, lesions, ulcers. No induration Neurologic: CN 2-12 grossly intact. Sensation intact, DTR normal. Strength 5/5 in all 4.  Psychiatric: Normal judgment and insight. Alert and oriented x 3. Normal mood.     Labs on Admission: I have personally reviewed following labs and imaging studies  CBC: Recent Labs  Lab 05/29/20 1101  WBC 8.6  NEUTROABS 7.8*  HGB 11.9*  HCT 35.4*  MCV 86.3  PLT 0000000   Basic Metabolic Panel: Recent Labs  Lab 05/29/20 1101 05/29/20 1232  Mirza Fessel 133*  --   K 2.7*  --  CL 91*  --   CO2 24  --   GLUCOSE 201*  --   BUN 32*  --   CREATININE 2.56*  --   CALCIUM 8.3*  --   MG  --  1.3*   GFR: Estimated Creatinine Clearance: 25.1 mL/min (A) (by C-G formula based on SCr of 2.56 mg/dL (H)). Liver Function Tests: No results for input(s): AST, ALT, ALKPHOS, BILITOT, PROT, ALBUMIN in the last 168 hours. No results for input(s): LIPASE, AMYLASE in the last 168 hours. No results for input(s): AMMONIA in the last 168 hours. Coagulation Profile: No results for input(s): INR, PROTIME in the last 168 hours. Cardiac Enzymes: No results for input(s): CKTOTAL, CKMB, CKMBINDEX, TROPONINI in the last 168 hours. BNP (last 3 results) No results for input(s): PROBNP in the last 8760 hours. HbA1C: No results for input(s): HGBA1C in the last 72 hours. CBG: No results for input(s): GLUCAP in the last 168 hours. Lipid Profile: Recent Labs    05/29/20 1232  TRIG 114   Thyroid Function Tests: No results for input(s): TSH, T4TOTAL, FREET4, T3FREE, THYROIDAB in the  last 72 hours. Anemia Panel: Recent Labs    05/29/20 1232  FERRITIN 472*   Urine analysis: No results found for: COLORURINE, APPEARANCEUR, LABSPEC, PHURINE, GLUCOSEU, HGBUR, BILIRUBINUR, KETONESUR, PROTEINUR, UROBILINOGEN, NITRITE, LEUKOCYTESUR Sepsis Labs: !!!!!!!!!!!!!!!!!!!!!!!!!!!!!!!!!!!!!!!!!!!! @LABRCNTIP (procalcitonin:4,lacticidven:4) ) Recent Results (from the past 240 hour(s))  SARS Coronavirus 2 by RT PCR (hospital order, performed in [place] hospital lab) Nasopharyngeal Nasopharyngeal Swab     Status: Abnormal   Collection Time: 05/29/20 12:52 PM   Specimen: Nasopharyngeal Swab  Result Value Ref Range Status   SARS Coronavirus 2 POSITIVE (A) NEGATIVE Final    Comment: RESULT CALLED TO, READ BACK BY AND VERIFIED WITH:  [email or web address],RN @ 1413 ON 05/29/2020, [email or web address].P (NOTE) SARS-CoV-2 target nucleic acids are DETECTED  SARS-CoV-2 RNA is generally detectable in upper respiratory specimens  during the acute phase of infection.  Positive results are indicative  of the presence of the identified virus, but do not rule out bacterial infection or co-infection with other pathogens not detected by the test.  Clinical correlation with patient history and  other diagnostic information is necessary to determine patient infection status.  The expected result is negative.  Fact Sheet for Patients:   StrictlyIdeas.no   Fact Sheet for Healthcare Providers:   BankingDealers.co.za    This test is not yet approved or cleared by the [place] FDA and  has been authorized for detection and/or diagnosis of SARS-CoV-2 by FDA under an Emergency Use Authorization (EUA).  This EUA will remain in effect (mea ning this test can be used) for the duration of  the COVID-19 declaration under Section 564(b)(1) of the Act, 21 U.S.C. section 360-bbb-3(b)(1), unless the authorization is terminated or revoked sooner.  Performed at [facility], [address] [place], [place] [address]      Radiological Exams on Admission: DG Chest Portable 1 View  Result Date: 05/29/2020 CLINICAL DATA:  COVID positive.  Shortness of breath.  Weakness. EXAM: PORTABLE CHEST 1 VIEW COMPARISON:  Two-view chest x-ray 01/13/2015 FINDINGS: Heart size is normal. Hilar fullness present bilaterally. Asymmetric left lower lobe airspace opacities are present. Upper lung fields are clear. IMPRESSION: 1. Asymmetric left lower lobe airspace disease compatible with pneumonia. 2. Bilateral hilar fullness. Underlying adenopathy is not excluded. Electronically Signed   By: San Morelle M.D.   On: 05/29/2020 11:35     All images have  been reviewed by me personally.  EKG: Independently reviewed.   Assessment/Plan Active Problems:   HTN (hypertension)   Depression   Asthma   ARF (acute renal failure) (HCC)   Pneumonia due to COVID-19 virus   Hypokalemia   Hyponatremia   Lactic acidosis   Hypomagnesemia   Elevated d-dimer   Assessment  plan  #Covid pneumonia #History of asthma  Patient presented with URI since 05/24/20.  Covid positive. CXR showed LLL infiltrate.  she did not have vaccine for Covid.   -Telemetry monitoring -Covid labs ordered -Room air O2 sats 95-97% on admission.  Will monitor O2 sat for hypoxia. -CXR- LLL infiltrate -She was started Paxlovid in the evening of 05/26/2020 by her PCP. Her GFR is 20 and dose is not defined for low GFR. Will hold for now - does not meet criteria for steroids and remdesivir for now.   #Acute renal failure with hyponatremia #Lactic acidosis #Hypokalemia #Hypomagnesemia  -BUN 32, creatinine 2.57 admission -Lactic acid 3.8, likely related to ARF -Potassium 2.7, magnesium 1.3 -IV fluids for hydration and follow-up on lactic acid -She received potassium and magnesium supplement at [facility] ED   #Elevated D-dimer  -VQ scan is pending  #Hypertension  Chronic and stable  #Depression   -Chronic and stable             DVT prophylaxis: Lovenox subcu Code Status: Full code Family Communication: Daughter over the phone Consults called: None Admission status: Observation  Status is: Observation  The patient remains OBS appropriate and will d/c before 2 midnights.  Dispo: The patient is from: Home              Anticipated d/c is to: Home              Anticipated d/c date is: 2 days              Patient currently is not medically stable to d/c.   Difficult to place patient No       Time Spent: 65 minutes.  >50% of the time was devoted to discussing the patients care, assessment, plan and disposition with other care givers along with counseling the patient about the risks and benefits of treatment.    Charlann Lange MD [facility]  If 7PM-7AM, please contact night-coverage   05/29/2020, 7:38 PM

## 2020-05-30 ENCOUNTER — Other Ambulatory Visit (HOSPITAL_COMMUNITY): Payer: Managed Care, Other (non HMO)

## 2020-05-30 ENCOUNTER — Observation Stay (HOSPITAL_COMMUNITY): Payer: Managed Care, Other (non HMO)

## 2020-05-30 DIAGNOSIS — Z8619 Personal history of other infectious and parasitic diseases: Secondary | ICD-10-CM | POA: Diagnosis not present

## 2020-05-30 DIAGNOSIS — Z833 Family history of diabetes mellitus: Secondary | ICD-10-CM | POA: Diagnosis not present

## 2020-05-30 DIAGNOSIS — R7881 Bacteremia: Secondary | ICD-10-CM

## 2020-05-30 DIAGNOSIS — Z791 Long term (current) use of non-steroidal anti-inflammatories (NSAID): Secondary | ICD-10-CM | POA: Diagnosis not present

## 2020-05-30 DIAGNOSIS — Z79899 Other long term (current) drug therapy: Secondary | ICD-10-CM | POA: Diagnosis not present

## 2020-05-30 DIAGNOSIS — R7989 Other specified abnormal findings of blood chemistry: Secondary | ICD-10-CM | POA: Diagnosis present

## 2020-05-30 DIAGNOSIS — Z825 Family history of asthma and other chronic lower respiratory diseases: Secondary | ICD-10-CM | POA: Diagnosis not present

## 2020-05-30 DIAGNOSIS — J189 Pneumonia, unspecified organism: Secondary | ICD-10-CM | POA: Diagnosis not present

## 2020-05-30 DIAGNOSIS — I5030 Unspecified diastolic (congestive) heart failure: Secondary | ICD-10-CM | POA: Diagnosis present

## 2020-05-30 DIAGNOSIS — Z8249 Family history of ischemic heart disease and other diseases of the circulatory system: Secondary | ICD-10-CM | POA: Diagnosis not present

## 2020-05-30 DIAGNOSIS — U071 COVID-19: Secondary | ICD-10-CM

## 2020-05-30 DIAGNOSIS — I10 Essential (primary) hypertension: Secondary | ICD-10-CM | POA: Diagnosis not present

## 2020-05-30 DIAGNOSIS — J13 Pneumonia due to Streptococcus pneumoniae: Secondary | ICD-10-CM | POA: Diagnosis present

## 2020-05-30 DIAGNOSIS — Z888 Allergy status to other drugs, medicaments and biological substances status: Secondary | ICD-10-CM | POA: Diagnosis not present

## 2020-05-30 DIAGNOSIS — E871 Hypo-osmolality and hyponatremia: Secondary | ICD-10-CM | POA: Diagnosis present

## 2020-05-30 DIAGNOSIS — Z6836 Body mass index (BMI) 36.0-36.9, adult: Secondary | ICD-10-CM | POA: Diagnosis not present

## 2020-05-30 DIAGNOSIS — J90 Pleural effusion, not elsewhere classified: Secondary | ICD-10-CM | POA: Diagnosis not present

## 2020-05-30 DIAGNOSIS — J45909 Unspecified asthma, uncomplicated: Secondary | ICD-10-CM

## 2020-05-30 DIAGNOSIS — N179 Acute kidney failure, unspecified: Secondary | ICD-10-CM | POA: Diagnosis present

## 2020-05-30 DIAGNOSIS — I110 Hypertensive heart disease with heart failure: Secondary | ICD-10-CM | POA: Diagnosis present

## 2020-05-30 DIAGNOSIS — K219 Gastro-esophageal reflux disease without esophagitis: Secondary | ICD-10-CM | POA: Diagnosis present

## 2020-05-30 DIAGNOSIS — E872 Acidosis: Secondary | ICD-10-CM | POA: Diagnosis present

## 2020-05-30 DIAGNOSIS — Z823 Family history of stroke: Secondary | ICD-10-CM | POA: Diagnosis not present

## 2020-05-30 DIAGNOSIS — I5033 Acute on chronic diastolic (congestive) heart failure: Secondary | ICD-10-CM | POA: Diagnosis present

## 2020-05-30 DIAGNOSIS — J918 Pleural effusion in other conditions classified elsewhere: Secondary | ICD-10-CM | POA: Diagnosis not present

## 2020-05-30 DIAGNOSIS — A403 Sepsis due to Streptococcus pneumoniae: Secondary | ICD-10-CM | POA: Diagnosis present

## 2020-05-30 DIAGNOSIS — E876 Hypokalemia: Secondary | ICD-10-CM | POA: Diagnosis present

## 2020-05-30 DIAGNOSIS — F32A Depression, unspecified: Secondary | ICD-10-CM | POA: Diagnosis present

## 2020-05-30 LAB — COMPREHENSIVE METABOLIC PANEL
ALT: 20 U/L (ref 0–44)
AST: 23 U/L (ref 15–41)
Albumin: 2.8 g/dL — ABNORMAL LOW (ref 3.5–5.0)
Alkaline Phosphatase: 61 U/L (ref 38–126)
Anion gap: 16 — ABNORMAL HIGH (ref 5–15)
BUN: 31 mg/dL — ABNORMAL HIGH (ref 8–23)
CO2: 20 mmol/L — ABNORMAL LOW (ref 22–32)
Calcium: 7.9 mg/dL — ABNORMAL LOW (ref 8.9–10.3)
Chloride: 99 mmol/L (ref 98–111)
Creatinine, Ser: 1.7 mg/dL — ABNORMAL HIGH (ref 0.44–1.00)
GFR, Estimated: 33 mL/min — ABNORMAL LOW (ref >60)
Glucose, Bld: 139 mg/dL — ABNORMAL HIGH (ref 70–99)
Potassium: 2.8 mmol/L — ABNORMAL LOW (ref 3.5–5.1)
Sodium: 135 mmol/L (ref 135–145)
Total Bilirubin: 0.9 mg/dL (ref 0.3–1.2)
Total Protein: 6.3 g/dL — ABNORMAL LOW (ref 6.5–8.1)

## 2020-05-30 LAB — BLOOD CULTURE ID PANEL (REFLEXED) - BCID2

## 2020-05-30 LAB — CBC WITH DIFFERENTIAL/PLATELET
Abs Immature Granulocytes: 0.02 10*3/uL (ref 0.00–0.07)
Basophils Absolute: 0 10*3/uL (ref 0.0–0.1)
Basophils Relative: 0 %
Eosinophils Absolute: 0 10*3/uL (ref 0.0–0.5)
Eosinophils Relative: 0 %
HCT: 34.2 % — ABNORMAL LOW (ref 36.0–46.0)
Hemoglobin: 11 g/dL — ABNORMAL LOW (ref 12.0–15.0)
Immature Granulocytes: 0 %
Lymphocytes Relative: 14 %
Lymphs Abs: 1 10*3/uL (ref 0.7–4.0)
MCH: 28.3 pg (ref 26.0–34.0)
MCHC: 32.2 g/dL (ref 30.0–36.0)
MCV: 87.9 fL (ref 80.0–100.0)
Monocytes Absolute: 0.2 10*3/uL (ref 0.1–1.0)
Monocytes Relative: 3 %
Neutro Abs: 5.7 10*3/uL (ref 1.7–7.7)
Neutrophils Relative %: 83 %
Platelets: 172 10*3/uL (ref 150–400)
RBC: 3.89 MIL/uL (ref 3.87–5.11)
RDW: 13.9 % (ref 11.5–15.5)
WBC: 7 10*3/uL (ref 4.0–10.5)
nRBC: 0 % (ref 0.0–0.2)

## 2020-05-30 LAB — MAGNESIUM: Magnesium: 1.9 mg/dL (ref 1.7–2.4)

## 2020-05-30 LAB — PHOSPHORUS: Phosphorus: 3.2 mg/dL (ref 2.5–4.6)

## 2020-05-30 LAB — LACTIC ACID, PLASMA: Lactic Acid, Venous: 3.7 mmol/L (ref 0.5–1.9)

## 2020-05-30 LAB — FERRITIN: Ferritin: 332 ng/mL — ABNORMAL HIGH (ref 11–307)

## 2020-05-30 LAB — C-REACTIVE PROTEIN: CRP: 44.2 mg/dL — ABNORMAL HIGH (ref <1.0)

## 2020-05-30 LAB — D-DIMER, QUANTITATIVE: D-Dimer, Quant: 0.8 ug/mL-FEU — ABNORMAL HIGH (ref 0.00–0.50)

## 2020-05-30 MED ORDER — MAGNESIUM SULFATE 2 GM/50ML IV SOLN
2.0000 g | Freq: Once | INTRAVENOUS | Status: AC
  Administered 2020-05-30: 2 g via INTRAVENOUS
  Filled 2020-05-30: qty 50

## 2020-05-30 MED ORDER — NIRMATRELVIR/RITONAVIR (PAXLOVID)TABLET
2.0000 | ORAL_TABLET | Freq: Two times a day (BID) | ORAL | Status: DC
  Filled 2020-05-30 (×2): qty 2

## 2020-05-30 MED ORDER — PANTOPRAZOLE SODIUM 40 MG PO TBEC
40.0000 mg | DELAYED_RELEASE_TABLET | Freq: Every day | ORAL | Status: DC
  Administered 2020-05-30 – 2020-05-31 (×2): 40 mg via ORAL
  Filled 2020-05-30 (×2): qty 1

## 2020-05-30 MED ORDER — LEVALBUTEROL TARTRATE 45 MCG/ACT IN AERO
2.0000 | INHALATION_SPRAY | Freq: Three times a day (TID) | RESPIRATORY_TRACT | Status: DC
  Administered 2020-05-30 – 2020-06-05 (×18): 2 via RESPIRATORY_TRACT
  Filled 2020-05-30: qty 15

## 2020-05-30 MED ORDER — GUAIFENESIN ER 600 MG PO TB12
600.0000 mg | ORAL_TABLET | Freq: Two times a day (BID) | ORAL | Status: DC
  Administered 2020-05-30 – 2020-06-05 (×13): 600 mg via ORAL
  Filled 2020-05-30 (×13): qty 1

## 2020-05-30 MED ORDER — NIRMATRELVIR/RITONAVIR (PAXLOVID)TABLET
2.0000 | ORAL_TABLET | Freq: Two times a day (BID) | ORAL | Status: AC
  Administered 2020-05-30 – 2020-06-01 (×5): 2 via ORAL
  Filled 2020-05-30 (×2): qty 2

## 2020-05-30 MED ORDER — SODIUM CHLORIDE 0.9 % IV SOLN
2.0000 g | INTRAVENOUS | Status: DC
  Administered 2020-05-30 – 2020-06-05 (×7): 2 g via INTRAVENOUS
  Filled 2020-05-30 (×7): qty 20

## 2020-05-30 MED ORDER — NIRMATRELVIR/RITONAVIR (PAXLOVID)TABLET
2.0000 | ORAL_TABLET | Freq: Two times a day (BID) | ORAL | Status: DC
  Filled 2020-05-30: qty 2

## 2020-05-30 MED ORDER — SODIUM CHLORIDE 0.9 % IV SOLN
200.0000 mg | Freq: Once | INTRAVENOUS | Status: DC
  Filled 2020-05-30: qty 40

## 2020-05-30 MED ORDER — POTASSIUM CHLORIDE CRYS ER 20 MEQ PO TBCR
40.0000 meq | EXTENDED_RELEASE_TABLET | Freq: Four times a day (QID) | ORAL | Status: AC
  Administered 2020-05-30 (×2): 40 meq via ORAL
  Filled 2020-05-30 (×2): qty 2

## 2020-05-30 MED ORDER — SODIUM CHLORIDE 0.9 % IV SOLN: 100.0000 mg | Freq: Every day | INTRAVENOUS | Status: DC

## 2020-05-30 NOTE — Plan of Care (Signed)

## 2020-05-30 NOTE — Progress Notes (Signed)
Lower extremity venous bilateral study completed.   Please see CV Proc for preliminary results.   Skylan Lara, RDMS  

## 2020-05-30 NOTE — Progress Notes (Signed)
PHARMACY - PHYSICIAN COMMUNICATION CRITICAL VALUE ALERT - BLOOD CULTURE IDENTIFICATION (BCID)  [name] is an 65 y.o. female who presented to [facility] on 05/29/2020 with a chief complaint of fever, cough, and SOB 2/2 COVID. Blood cultures growing GPC in pairs in 3/4 bottles. Identified as streptococcus pneumoniae and patient currently on ceftriaxone.   Assessment: Likely superimposed bacterial infection in patient with COVID-19 pneumonia. Patient currently on appropriate emperic antibiotic therapy with ceftriaxone.   Name of physician (or Provider) Contacted: Dr. Oren Binet  Current antibiotics: Ceftriaxone 2g IV q24h   Changes to prescribed antibiotics recommended:  Patient is on recommended antibiotics - No changes needed  Results for orders placed or performed during the hospital encounter of 05/29/20  Blood Culture ID Panel (Reflexed) (Collected: 05/29/2020 12:41 PM)  Result Value Ref Range   Enterococcus faecalis NOT DETECTED NOT DETECTED   Enterococcus Faecium NOT DETECTED NOT DETECTED   Listeria monocytogenes NOT DETECTED NOT DETECTED   Staphylococcus species NOT DETECTED NOT DETECTED   Staphylococcus aureus (BCID) NOT DETECTED NOT DETECTED   Staphylococcus epidermidis NOT DETECTED NOT DETECTED   Staphylococcus lugdunensis NOT DETECTED NOT DETECTED   Streptococcus species DETECTED (A) NOT DETECTED   Streptococcus agalactiae NOT DETECTED NOT DETECTED   Streptococcus pneumoniae DETECTED (A) NOT DETECTED   Streptococcus pyogenes NOT DETECTED NOT DETECTED   A.calcoaceticus-baumannii NOT DETECTED NOT DETECTED   Bacteroides fragilis NOT DETECTED NOT DETECTED   Enterobacterales NOT DETECTED NOT DETECTED   Enterobacter cloacae complex NOT DETECTED NOT DETECTED   Escherichia coli NOT DETECTED NOT DETECTED   Klebsiella aerogenes NOT DETECTED NOT DETECTED   Klebsiella oxytoca NOT DETECTED NOT DETECTED   Klebsiella pneumoniae NOT DETECTED NOT DETECTED   Proteus species NOT  DETECTED NOT DETECTED   Salmonella species NOT DETECTED NOT DETECTED   Serratia marcescens NOT DETECTED NOT DETECTED   Haemophilus influenzae NOT DETECTED NOT DETECTED   Neisseria meningitidis NOT DETECTED NOT DETECTED   Pseudomonas aeruginosa NOT DETECTED NOT DETECTED   Stenotrophomonas maltophilia NOT DETECTED NOT DETECTED   Candida albicans NOT DETECTED NOT DETECTED   Candida auris NOT DETECTED NOT DETECTED   Candida glabrata NOT DETECTED NOT DETECTED   Candida krusei NOT DETECTED NOT DETECTED   Candida parapsilosis NOT DETECTED NOT DETECTED   Candida tropicalis NOT DETECTED NOT DETECTED   Cryptococcus neoformans/gattii NOT DETECTED NOT DETECTED    Claudina Lick, PharmD PGY1 Acute Care Pharmacy Resident 05/30/2020 8:41 AM  Please check [email or web address] for unit-specific pharmacy phone numbers.

## 2020-05-30 NOTE — Progress Notes (Addendum)
PROGRESS NOTE                                                                                                                                                                                                             Patient Demographics:    [name], is a 65 y.o. female, DOB - 1955/05/19, ZOX:[id number]  Outpatient Primary MD for the patient is Lujean Amel, MD   Admit date - 05/29/2020   LOS - 0  Chief Complaint  Patient presents with  . Shortness of Breath       Brief Narrative: Patient is a 65 y.o. female with PMHx of HTN, hepatitis C, asthma, depression-who tested positive for COVID-19 on 1/30 (Home test)-after multiple family exposures-presented to the hospital with fevers, chills, cough, diarrhea and shortness of breath-she was found to have AKI, left-sided infiltrate-subsequent blood cultures positive for pneumococcus.  See below for further details.   COVID-19 vaccinated status: Unvaccinated  Significant Events: 2/5>> Admit to [facility] for AKI, PNA-blood cultures positive for pneumococcus  Significant studies: 2/5>>Chest x-ray: Left lower pneumonia, bilateral hilar fullness  COVID-19 medications: Paxlovid:2/2>>  Antibiotics: Rocephin: 2/6>>  Microbiology data: 2/5 >>blood culture: Gram-positive cocci in chains  Procedures: None  Consults: None  DVT prophylaxis: enoxaparin (LOVENOX) injection 40 mg Start: 05/29/20 2000    Subjective:    [name] today appears anxious but stable.  Not on oxygen.  Continues to cough.   Assessment  & Plan :   Sepsis due to pneumococcal pneumonia and bacteremia: Although improved-continues to have tachycardia-but otherwise sepsis physiology gradually improving.  Renal function better this morning.  Continue IV fluids-have started Rocephin.  Repeat blood cultures in the next 24 hours-check echo.  Follow closely.  AKI: Hemodynamically mediated due to  sepsis/bacteremia-improving with IV fluids.  Avoid nephrotoxic agents.  Hypomagnesemia/hypokalemia: Due to GI loss-replete-recheck in a.m.  COVID-19 infection: Chest x-ray-overall clinical features more compatible with a bacterial infection as the primary driver of her clinical presentation-very likely that her process started off as a COVID-19 infection with superimposed pneumococcal superinfection.  CRP significantly elevated-but suspect this is more from bacteremia/pneumococcal infection then COVID-19 infection.  Suspect for now given overall clinical stability-we could just resume her back Paxlovid-have discussed with pharmacy.  Will watch closely-if she deteriorates-and starts developing respiratory failure-we can reconsider regarding steroids and antivirals.   O2  requirements:  SpO2: 97 %  COVID-19 Labs: Recent Labs    05/29/20 1101 05/29/20 1232 05/30/20 0310  DDIMER 0.69*  --  0.80*  FERRITIN  --  472* 332*  LDH  --  209*  --   CRP  --  39.3* 44.2*   No results found for: BNP  Recent Labs  Lab 05/29/20 1232  PROCALCITON 23.17    Lab Results  Component Value Date   SARSCOV2NAA POSITIVE (A) 05/29/2020   [place] Not Detected 07/28/2019   [place] Not Detected 03/24/2019   SARSCOV2NAA Detected (A) 03/07/2019    Elevated D-dimer: Mild-doubt VTE-she apparently was not able to do a VQ scan yesterday-we will cancel VQ scan-and do a lower extremity Doppler.  If lower extremity Doppler negative-with no hypoxia-doubt further work-up will be required.  HTN-BP stable-hold Avapro and chlorthalidone for now.  GERD: Continue PPI  Depression: Continue Effexor  Asthma: Stable-not wheezing-continue bronchodilators.  History of hepatitis C-treated per patient.  Obesity: Estimated body mass index is 36.56 kg/m as calculated from the following:   Height as of this encounter: 5\' 4"  (1.626 m).   Weight as of this encounter: 96.6 kg.      GI prophylaxis: PPI  ABG: No  results found for: PHART, PCO2ART, PO2ART, HCO3, TCO2, ACIDBASEDEF, O2SAT  Vent Settings: N/A    Condition - Guarded  Family Communication  : Daughter updated over the phone-she was on the phone with the patient when I was rounding in the room.  Code Status :  Full Code  Diet :  Diet Order            Diet regular Room service appropriate? Yes; Fluid consistency: Thin  Diet effective now                  Disposition Plan  :   Status is: Observation  The patient will require care spanning > 2 midnights and should be moved to inpatient because: Inpatient level of care appropriate due to severity of illness  Dispo: The patient is from: Home              Anticipated d/c is to: Home              Anticipated d/c date is: > 3 days              Patient currently is not medically stable to d/c.   Difficult to place patient No     Barriers to discharge: Ongoing sepsis physiology with pneumonia and pneumococcal bacteremia-not stable for discharge as patient remains on IV antimicrobial therapy.  Antimicorbials  :    Anti-infectives (From admission, onward)   Start     Dose/Rate Route Frequency Ordered Stop   05/31/20 1000  remdesivir 100 mg in sodium chloride 0.9 % 100 mL IVPB  Status:  Discontinued       "Followed by" Linked Group Details   100 mg 200 mL/hr over 30 Minutes Intravenous Daily 05/30/20 0931 05/30/20 1005   05/30/20 1030  remdesivir 200 mg in sodium chloride 0.9% 250 mL IVPB  Status:  Discontinued       "Followed by" Linked Group Details   200 mg 580 mL/hr over 30 Minutes Intravenous Once 05/30/20 0931 05/30/20 1005   05/30/20 0815  cefTRIAXone (ROCEPHIN) 2 g in sodium chloride 0.9 % 100 mL IVPB        2 g 200 mL/hr over 30 Minutes Intravenous Every 24 hours 05/30/20 0715  Inpatient Medications  Scheduled Meds: . enoxaparin (LOVENOX) injection  40 mg Subcutaneous Q24H  . feeding supplement  237 mL Oral BID BM  . gabapentin  100 mg Oral BID  .  guaiFENesin  600 mg Oral BID  . levalbuterol  2 puff Inhalation Q8H  . pantoprazole  40 mg Oral Q1200  . potassium chloride  40 mEq Oral Q6H  . sodium chloride flush  3 mL Intravenous Q12H  . venlafaxine XR  37.5 mg Oral Daily   Continuous Infusions: . sodium chloride 75 mL/hr at 05/30/20 0326  . sodium chloride    . cefTRIAXone (ROCEPHIN)  IV 2 g (05/30/20 0743)   PRN Meds:.sodium chloride, acetaminophen, albuterol, chlorpheniramine-HYDROcodone, ondansetron **OR** ondansetron (ZOFRAN) IV, oxyCODONE, sodium chloride flush   Time Spent in minutes  35    See all Orders from today for further details   Oren Binet M.D on 05/30/2020 at 10:12 AM  To page go to [email or web address] - use universal password  [facility] -  Office  [phone]    Objective:   Vitals:   05/29/20 2247 05/30/20 0120 05/30/20 0420 05/30/20 0748  BP: 121/69 115/77 103/76 112/69  Pulse: (!) 120 (!) 117 (!) 111 (!) 118  Resp: 19 18 18 19   Temp: 98.2 F (36.8 C) 98 F (36.7 C) 98.4 F (36.9 C) (!) 97.5 F (36.4 C)  TempSrc: Oral Oral Oral Oral  SpO2: 95% 96% 93% 97%  Weight:      Height:        Wt Readings from Last 3 Encounters:  05/29/20 96.6 kg  06/04/17 95.8 kg  03/15/16 91.3 kg     Intake/Output Summary (Last 24 hours) at 05/30/2020 1012 Last data filed at 05/30/2020 1009 Gross per 24 hour  Intake 1080.78 ml  Output --  Net 1080.78 ml     Physical Exam Gen Exam:Alert awake-not in any distress HEENT:atraumatic, normocephalic Chest: B/L clear to auscultation anteriorly CVS:S1S2 regular-tachycardic Abdomen:soft non tender, non distended Extremities:no edema Neurology: Non focal Skin: no rash   Data Review:    CBC Recent Labs  Lab 05/29/20 1101 05/30/20 0310  WBC 8.6 7.0  HGB 11.9* 11.0*  HCT 35.4* 34.2*  PLT 176 172  MCV 86.3 87.9  MCH 29.0 28.3  MCHC 33.6 32.2  RDW 14.0 13.9  LYMPHSABS 0.5* 1.0  MONOABS 0.2 0.2  EOSABS 0.0 0.0  BASOSABS 0.0 0.0     Chemistries  Recent Labs  Lab 05/29/20 1101 05/29/20 1232 05/29/20 2019 05/30/20 0310  NA 133*  --  135 135  K 2.7*  --  3.1* 2.8*  CL 91*  --  97* 99  CO2 24  --  23 20*  GLUCOSE 201*  --  132* 139*  BUN 32*  --  33* 31*  CREATININE 2.56*  --  2.05* 1.70*  CALCIUM 8.3*  --  7.8* 7.9*  MG  --  1.3* 1.7 1.9  AST  --   --  22 23  ALT  --   --  21 20  ALKPHOS  --   --  53 61  BILITOT  --   --  1.1 0.9   ------------------------------------------------------------------------------------------------------------------ Recent Labs    05/29/20 1232  TRIG 114    No results found for: HGBA1C ------------------------------------------------------------------------------------------------------------------ No results for input(s): TSH, T4TOTAL, T3FREE, THYROIDAB in the last 72 hours.  Invalid input(s): FREET3 ------------------------------------------------------------------------------------------------------------------ Recent Labs    05/29/20 1232 05/30/20 0310  FERRITIN 472* 332*    Coagulation profile  No results for input(s): INR, PROTIME in the last 168 hours.  Recent Labs    05/29/20 1101 05/30/20 0310  DDIMER 0.69* 0.80*    Cardiac Enzymes No results for input(s): CKMB, TROPONINI, MYOGLOBIN in the last 168 hours.  Invalid input(s): CK ------------------------------------------------------------------------------------------------------------------ No results found for: BNP  Micro Results Recent Results (from the past 240 hour(s))  Blood Culture (routine x 2)     Status: None (Preliminary result)   Collection Time: 05/29/20 12:41 PM   Specimen: BLOOD  Result Value Ref Range Status   Specimen Description   Final    BLOOD RIGHT ANTECUBITAL Performed at [facility], [address] [place], [place] [address]    Special Requests   Final    BOTTLES DRAWN AEROBIC AND ANAEROBIC Blood Culture adequate volume Performed at [facility], [address] [place], [place] [address]    Culture  Setup Time   Final    GRAM POSITIVE COCCI IN PAIRS IN BOTH AEROBIC AND ANAEROBIC BOTTLES CRITICAL RESULT CALLED TO, READ BACK BY AND VERIFIED WITH: CATHY PIERCE PHARMD @0826  05/30/20 EB Performed at [facility], [place] [address] [place], [place] [address]    Culture GRAM POSITIVE COCCI  Final   Report Status PENDING  Incomplete  Blood Culture ID Panel (Reflexed)     Status: Abnormal   Collection Time: 05/29/20 12:41 PM  Result Value Ref Range Status   Enterococcus faecalis NOT DETECTED NOT DETECTED Final   Enterococcus Faecium NOT DETECTED NOT DETECTED Final   Listeria monocytogenes NOT DETECTED NOT DETECTED Final   Staphylococcus species NOT DETECTED NOT DETECTED Final   Staphylococcus aureus (BCID) NOT DETECTED NOT DETECTED Final   Staphylococcus epidermidis NOT DETECTED NOT DETECTED Final   Staphylococcus lugdunensis NOT DETECTED NOT DETECTED Final   Streptococcus species DETECTED (A) NOT DETECTED Final    Comment: CRITICAL RESULT CALLED TO, READ BACK BY AND VERIFIED WITH: CATHY PIERCE PHARMD @0826  05/30/20 EB    Streptococcus agalactiae NOT DETECTED NOT DETECTED Final   Streptococcus pneumoniae DETECTED (A) NOT DETECTED Final    Comment: CRITICAL RESULT CALLED TO, READ BACK BY AND VERIFIED WITH: CATHY PIERCE PHARMD @0826  05/30/20 EB    Streptococcus pyogenes NOT DETECTED NOT DETECTED Final   A.calcoaceticus-baumannii NOT DETECTED NOT DETECTED Final   Bacteroides fragilis NOT DETECTED NOT DETECTED Final   Enterobacterales NOT DETECTED NOT DETECTED Final   Enterobacter cloacae complex NOT DETECTED NOT DETECTED Final   Escherichia coli NOT DETECTED NOT DETECTED Final   Klebsiella aerogenes NOT DETECTED NOT DETECTED Final   Klebsiella oxytoca NOT DETECTED NOT DETECTED Final   Klebsiella pneumoniae NOT DETECTED NOT DETECTED Final   Proteus species NOT DETECTED NOT DETECTED Final   Salmonella species NOT DETECTED  NOT DETECTED Final   Serratia marcescens NOT DETECTED NOT DETECTED Final   Haemophilus influenzae NOT DETECTED NOT DETECTED Final   Neisseria meningitidis NOT DETECTED NOT DETECTED Final   Pseudomonas aeruginosa NOT DETECTED NOT DETECTED Final   Stenotrophomonas maltophilia NOT DETECTED NOT DETECTED Final   Candida albicans NOT DETECTED NOT DETECTED Final   Candida auris NOT DETECTED NOT DETECTED Final   Candida glabrata NOT DETECTED NOT DETECTED Final   Candida krusei NOT DETECTED NOT DETECTED Final   Candida parapsilosis NOT DETECTED NOT DETECTED Final   Candida tropicalis NOT DETECTED NOT DETECTED Final   Cryptococcus neoformans/gattii NOT DETECTED NOT DETECTED Final    Comment: Performed at [facility], [address] [place], [place] [address]  Blood Culture (routine x 2)     Status: None (Preliminary result)   Collection Time: 05/29/20 12:47 PM   Specimen: BLOOD  Result Value Ref Range Status   Specimen Description   Final    BLOOD LEFT ANTECUBITAL Performed at [facility], [address] [place], [place] [address]    Special Requests   Final    BOTTLES DRAWN AEROBIC AND ANAEROBIC Blood Culture adequate volume Performed at [facility], [address] [place], [place] [address]    Culture   Final    GRAM POSITIVE COCCI IN PAIRS AEROBIC BOTTLE ONLY Performed at [facility], [place] [address], [place], [place] [address]    Report Status PENDING  Incomplete  SARS Coronavirus 2 by RT PCR (hospital order, performed in [facility] hospital lab) Nasopharyngeal Nasopharyngeal Swab     Status: Abnormal   Collection Time: 05/29/20 12:52 PM   Specimen: Nasopharyngeal Swab  Result Value Ref Range Status   SARS Coronavirus 2 POSITIVE (A) NEGATIVE Final    Comment: RESULT CALLED TO, READ BACK BY AND VERIFIED WITH:  [email or web address],RN @ 1413 ON 05/29/2020, [email or web address] (NOTE) SARS-CoV-2 target nucleic acids are DETECTED  SARS-CoV-2 RNA is generally  detectable in upper respiratory specimens  during the acute phase of infection.  Positive results are indicative  of the presence of the identified virus, but do not rule out bacterial infection or co-infection with other pathogens not detected by the test.  Clinical correlation with patient history and  other diagnostic information is necessary to determine patient infection status.  The expected result is negative.  Fact Sheet for Patients:   StrictlyIdeas.no   Fact Sheet for Healthcare Providers:   BankingDealers.co.za    This test is not yet approved or cleared by the [place] FDA and  has been authorized for detection and/or diagnosis of SARS-CoV-2 by FDA under an Emergency Use Authorization (EUA).  This EUA will remain in effect (mea ning this test can be used) for the duration of  the COVID-19 declaration under Section 564(b)(1) of the Act, 21 U.S.C. section 360-bbb-3(b)(1), unless the authorization is terminated or revoked sooner.  Performed at [facility], [address], [place], [place] [address]     Radiology Reports DG Chest Portable 1 View  Result Date: 05/29/2020 CLINICAL DATA:  COVID positive.  Shortness of breath.  Weakness. EXAM: PORTABLE CHEST 1 VIEW COMPARISON:  Two-view chest x-ray 01/13/2015 FINDINGS: Heart size is normal. Hilar fullness present bilaterally. Asymmetric left lower lobe airspace opacities are present. Upper lung fields are clear. IMPRESSION: 1. Asymmetric left lower lobe airspace disease compatible with pneumonia. 2. Bilateral hilar fullness. Underlying adenopathy is not excluded. Electronically Signed   By: San Morelle M.D.   On: 05/29/2020 11:35

## 2020-05-30 NOTE — Significant Event (Signed)
CRITICAL VALUE ALERT  Critical Value:  Lactic Acid 2.9  Date & Time Notied:  05/29/20 @ 2141  Provider Notified: Ninetta Lights, MD  Orders Received/Actions taken: No new orders.

## 2020-05-31 ENCOUNTER — Encounter (HOSPITAL_COMMUNITY): Payer: Self-pay | Admitting: Internal Medicine

## 2020-05-31 ENCOUNTER — Inpatient Hospital Stay (HOSPITAL_COMMUNITY): Payer: Managed Care, Other (non HMO)

## 2020-05-31 DIAGNOSIS — N179 Acute kidney failure, unspecified: Secondary | ICD-10-CM | POA: Diagnosis not present

## 2020-05-31 DIAGNOSIS — U071 COVID-19: Secondary | ICD-10-CM | POA: Diagnosis not present

## 2020-05-31 DIAGNOSIS — I10 Essential (primary) hypertension: Secondary | ICD-10-CM | POA: Diagnosis not present

## 2020-05-31 LAB — CBC WITH DIFFERENTIAL/PLATELET
Abs Immature Granulocytes: 0.16 10*3/uL — ABNORMAL HIGH (ref 0.00–0.07)
Basophils Absolute: 0.1 10*3/uL (ref 0.0–0.1)
Basophils Relative: 1 %
Eosinophils Absolute: 0.1 10*3/uL (ref 0.0–0.5)
Eosinophils Relative: 2 %
HCT: 31.8 % — ABNORMAL LOW (ref 36.0–46.0)
Hemoglobin: 10.8 g/dL — ABNORMAL LOW (ref 12.0–15.0)
Immature Granulocytes: 2 %
Lymphocytes Relative: 18 %
Lymphs Abs: 1.5 10*3/uL (ref 0.7–4.0)
MCH: 29.3 pg (ref 26.0–34.0)
MCHC: 34 g/dL (ref 30.0–36.0)
MCV: 86.4 fL (ref 80.0–100.0)
Monocytes Absolute: 0.3 10*3/uL (ref 0.1–1.0)
Monocytes Relative: 4 %
Neutro Abs: 6.4 10*3/uL (ref 1.7–7.7)
Neutrophils Relative %: 73 %
Platelets: 113 10*3/uL — ABNORMAL LOW (ref 150–400)
RBC: 3.68 MIL/uL — ABNORMAL LOW (ref 3.87–5.11)
RDW: 14.3 % (ref 11.5–15.5)
WBC: 8.7 10*3/uL (ref 4.0–10.5)
nRBC: 0 % (ref 0.0–0.2)

## 2020-05-31 LAB — COMPREHENSIVE METABOLIC PANEL
ALT: 21 U/L (ref 0–44)
AST: 26 U/L (ref 15–41)
Albumin: 2.4 g/dL — ABNORMAL LOW (ref 3.5–5.0)
Alkaline Phosphatase: 65 U/L (ref 38–126)
Anion gap: 13 (ref 5–15)
BUN: 28 mg/dL — ABNORMAL HIGH (ref 8–23)
CO2: 19 mmol/L — ABNORMAL LOW (ref 22–32)
Calcium: 8.1 mg/dL — ABNORMAL LOW (ref 8.9–10.3)
Chloride: 101 mmol/L (ref 98–111)
Creatinine, Ser: 1.24 mg/dL — ABNORMAL HIGH (ref 0.44–1.00)
GFR, Estimated: 49 mL/min — ABNORMAL LOW (ref >60)
Glucose, Bld: 90 mg/dL (ref 70–99)
Potassium: 3.6 mmol/L (ref 3.5–5.1)
Sodium: 133 mmol/L — ABNORMAL LOW (ref 135–145)
Total Bilirubin: 0.5 mg/dL (ref 0.3–1.2)
Total Protein: 5.8 g/dL — ABNORMAL LOW (ref 6.5–8.1)

## 2020-05-31 LAB — PROCALCITONIN: Procalcitonin: 7.7 ng/mL

## 2020-05-31 LAB — D-DIMER, QUANTITATIVE: D-Dimer, Quant: 1.43 ug/mL-FEU — ABNORMAL HIGH (ref 0.00–0.50)

## 2020-05-31 LAB — MAGNESIUM: Magnesium: 1.5 mg/dL — ABNORMAL LOW (ref 1.7–2.4)

## 2020-05-31 LAB — C-REACTIVE PROTEIN: CRP: 41 mg/dL — ABNORMAL HIGH (ref <1.0)

## 2020-05-31 MED ORDER — PANTOPRAZOLE SODIUM 40 MG PO TBEC
40.0000 mg | DELAYED_RELEASE_TABLET | Freq: Two times a day (BID) | ORAL | Status: DC
  Administered 2020-05-31 – 2020-06-05 (×10): 40 mg via ORAL
  Filled 2020-05-31 (×10): qty 1

## 2020-05-31 MED ORDER — POTASSIUM CHLORIDE CRYS ER 20 MEQ PO TBCR
40.0000 meq | EXTENDED_RELEASE_TABLET | Freq: Once | ORAL | Status: AC
  Administered 2020-05-31: 40 meq via ORAL
  Filled 2020-05-31: qty 2

## 2020-05-31 MED ORDER — FUROSEMIDE 10 MG/ML IJ SOLN
INTRAMUSCULAR | Status: AC
  Filled 2020-05-31: qty 4

## 2020-05-31 MED ORDER — MAGNESIUM SULFATE 4 GM/100ML IV SOLN
4.0000 g | Freq: Once | INTRAVENOUS | Status: AC
  Administered 2020-05-31: 4 g via INTRAVENOUS
  Filled 2020-05-31: qty 100

## 2020-05-31 MED ORDER — FUROSEMIDE 10 MG/ML IJ SOLN
40.0000 mg | Freq: Once | INTRAMUSCULAR | Status: AC
  Administered 2020-05-31: 40 mg via INTRAVENOUS

## 2020-05-31 MED ORDER — BENZONATATE 100 MG PO CAPS
200.0000 mg | ORAL_CAPSULE | Freq: Three times a day (TID) | ORAL | Status: DC
  Administered 2020-05-31 – 2020-06-05 (×15): 200 mg via ORAL
  Filled 2020-05-31 (×15): qty 2

## 2020-05-31 MED ORDER — FUROSEMIDE 10 MG/ML IJ SOLN: 60.0000 mg | Freq: Once | INTRAMUSCULAR | Status: DC

## 2020-05-31 MED ORDER — ALUM & MAG HYDROXIDE-SIMETH 200-200-20 MG/5ML PO SUSP
30.0000 mL | ORAL | Status: DC | PRN
  Administered 2020-05-31: 30 mL via ORAL
  Filled 2020-05-31: qty 30

## 2020-05-31 NOTE — Progress Notes (Signed)
Physical Therapy Evaluation Patient Details Name: [name] MRN: [id number] DOB: August 09, 1955 Today's Date: 05/31/2020   History of Present Illness  65 y.o. female with PMHx of HTN, hepatitis C, asthma, depression-who tested positive for COVID-19 on 1/30 (Home test)-after multiple family exposures-presented to the hospital with fevers, chills, cough, diarrhea and shortness of breath-she was found to have AKI, left-sided infiltrate-subsequent blood cultures positive for pneumococcus. s/p L patial knee replacement 04-29-20.  Clinical Impression   Pt admitted with above diagnosis. Prior to admission, pt was s/p Lt TKR and was using a cane for stability. She currently is nearly back to that recent baseline and can continue to beneft from to address deficits listed below.  Pt currently with functional limitations due to the deficits listed below (see PT Problem List). Pt will benefit from skilled PT to increase their independence and safety with mobility to allow discharge to the venue listed below.       Follow Up Recommendations Outpatient PT (resume OPPT for TKR)    Equipment Recommendations  None recommended by PT    Recommendations for Other Services       Precautions / Restrictions Precautions Precautions: Fall;Knee      Mobility  Bed Mobility                    Transfers Overall transfer level: Needs assistance   Transfers: Sit to/from Stand Sit to Stand: Supervision            Ambulation/Gait Ambulation/Gait assistance: Min guard Gait Distance (Feet): 25 Feet (x 2) Assistive device: IV Pole Gait Pattern/deviations: Step-through pattern     General Gait Details: cautious, wanting to hold onto something and used IV pole with good results  Stairs            Wheelchair Mobility    Modified Rankin (Stroke Patients Only)       Balance Overall balance assessment: Mild deficits observed, not formally tested (reaching out with B hands)                                            Pertinent Vitals/Pain Pain Assessment: Faces Faces Pain Scale: Hurts even more Pain Location: left ribs Pain Descriptors / Indicators: Sharp (with coughing) Pain Intervention(s): Limited activity within patient's tolerance;Monitored during session;Repositioned    Home Living Family/patient expects to be discharged to:: Private residence Living Arrangements: Children (daughter adn son in [employer]) Available Help at Discharge: Family;Available 24 hours/day Type of Home: House Home Access: Stairs to enter Entrance Stairs-Rails: None Entrance Stairs-Number of Steps: 1 Home Layout: Two level;Able to live on main level with bedroom/bathroom Home Equipment: Shower seat;Bedside commode;Cane - single point;Walker - 2 wheels      Prior Function Level of Independence: Independent with assistive device(s)         Comments: drives; independent with IADL tasks     Hand Dominance   Dominant Hand: Right    Extremity/Trunk Assessment   Upper Extremity Assessment Upper Extremity Assessment: Defer to OT evaluation    Lower Extremity Assessment Lower Extremity Assessment: Overall WFL for tasks assessed (lt knee 0-100 flexion)    Cervical / Trunk Assessment Cervical / Trunk Assessment: Normal  Communication   Communication: No difficulties  Cognition Arousal/Alertness: Awake/alert Behavior During Therapy: WFL for tasks assessed/performed Overall Cognitive Status: Within Functional Limits for tasks assessed  General Comments General comments (skin integrity, edema, etc.): max HR 120, sats 95+% throughout    Exercises Total Joint Exercises Ankle Circles/Pumps: AROM;Both;10 reps Quad Sets: AROM;Both;5 reps Long Arc Quad: AROM;Left;5 reps Other Exercises Other Exercises: Educated and pt return demonstrated COVID HEP with theraband (horizontal scapular adduction; PNF D1 pattern;  biceps; triceps) plus above LE exercies   Assessment/Plan    PT Assessment Patient needs continued PT services  PT Problem List Decreased strength;Decreased range of motion;Decreased activity tolerance;Decreased balance;Decreased mobility;Decreased knowledge of use of DME;Pain       PT Treatment Interventions DME instruction;Gait training;Functional mobility training;Therapeutic activities;Therapeutic exercise;Patient/family education    PT Goals (Current goals can be found in the Care Plan section)  Acute Rehab PT Goals Patient Stated Goal: to get better adn go home PT Goal Formulation: With patient Time For Goal Achievement: 06/14/20 Potential to Achieve Goals: Good    Frequency Min 3X/week   Barriers to discharge        Co-evaluation               AM-PAC PT "6 Clicks" Mobility  Outcome Measure Help needed turning from your back to your side while in a flat bed without using bedrails?: None Help needed moving from lying on your back to sitting on the side of a flat bed without using bedrails?: None Help needed moving to and from a bed to a chair (including a wheelchair)?: A Little Help needed standing up from a chair using your arms (e.g., wheelchair or bedside chair)?: A Little Help needed to walk in hospital room?: A Little Help needed climbing 3-5 steps with a railing? : A Little 6 Click Score: 20    End of Session   Activity Tolerance: Patient tolerated treatment well Patient left: in chair;with call bell/phone within reach   PT Visit Diagnosis: Other abnormalities of gait and mobility (R26.89);Difficulty in walking, not elsewhere classified (R26.2)    Time: 8916-9450 PT Time Calculation (min) (ACUTE ONLY): 25 min   Charges:   PT Evaluation $PT Eval Moderate Complexity: 1 Mod           Arby Barrette, PT Pager [phone]   Rexanne Mano 05/31/2020, 2:28 PM

## 2020-05-31 NOTE — Progress Notes (Signed)
Initial Nutrition Assessment  DOCUMENTATION CODES:   Obesity unspecified  INTERVENTION:  Continue Ensure Enlive po BID, each supplement provides 350 kcal and 20 grams of protein  Encourage adequate PO intake.   NUTRITION DIAGNOSIS:   Increased nutrient needs related to catabolic illness (COVID) as evidenced by estimated needs.  GOAL:   Patient will meet greater than or equal to 90% of their needs  MONITOR:   PO intake,Supplement acceptance,Skin,Weight trends,Labs,I & O's  REASON FOR ASSESSMENT:   Malnutrition Screening Tool    ASSESSMENT:   65 y.o. female with PMHx of HTN, hepatitis C, asthma, depression-who tested positive for COVID-19 presents with fevers, chills, cough, diarrhea and shortness of breath-she was found to have AKI, left-sided infiltrate-subsequent blood cultures positive for pneumococcus.  Meal completion has been 50-100% with most recent intake at 100%. Pt has been tolerating her po diet well with no difficulties. Pt did reports poor appetite for a couple of days prior to admission, however has been eating well. Pt currently has Ensure ordered and has been consuming them. RD to continue with current orders to aid in caloric and protein needs.   Unable to complete Nutrition-Focused physical exam at this time.   Labs and medications reviewed.   Diet Order:   Diet Order            Diet regular Room service appropriate? Yes; Fluid consistency: Thin  Diet effective now                 EDUCATION NEEDS:   Not appropriate for education at this time  Skin:  Skin Assessment: Reviewed RN Assessment  Last BM:  2/6  Height:   Ht Readings from Last 1 Encounters:  05/29/20 5\' 4"  (1.626 m)    Weight:   Wt Readings from Last 1 Encounters:  05/29/20 96.6 kg    BMI:  Body mass index is 36.56 kg/m.  Estimated Nutritional Needs:   Kcal:  1850-2050  Protein:  90-105 grams  Fluid:  >/= 1.8 L/day  Corrin Parker, MS, RD, LDN RD pager  number/after hours weekend pager number on Amion.

## 2020-05-31 NOTE — Progress Notes (Signed)
Informed by nurse-she is slightly more congested than this morning.  She was started on 2 L by the nurse earlier this evening.  On exam:  Comfortable Lungs: Bibasilar rales Extremities: 2+ pitting edema  Impression/plan Probable fluid overload-we will try 1 dose of Lasix Awaiting chest x-ray If hypoxemia does not improve-she needs to be started on steroids-as she is still at risk of Covid pneumonitis (CRP significantly high but likely from lobar pneumonia/bacteremia) We will sign out to our night for coverage.

## 2020-05-31 NOTE — Plan of Care (Signed)

## 2020-05-31 NOTE — Progress Notes (Addendum)
PROGRESS NOTE                                                                                                                                                                                                             Patient Demographics:    [name], is a 65 y.o. female, DOB - [birth year], OEU:[id number]  Outpatient Primary MD for the patient is Lujean Amel, MD   Admit date - 05/29/2020   LOS - 1  Chief Complaint  Patient presents with  . Shortness of Breath       Brief Narrative: Patient is a 65 y.o. female with PMHx of HTN, hepatitis C, asthma, depression-who tested positive for COVID-19 on 1/30 (Home test)-after multiple family exposures-presented to the hospital with fevers, chills, cough, diarrhea and shortness of breath-she was found to have AKI, left-sided infiltrate-subsequent blood cultures positive for pneumococcus.  See below for further details.   COVID-19 vaccinated status: Unvaccinated  Significant Events: 2/5>> Admit to [facility] for AKI, PNA-blood cultures positive for pneumococcus  Significant studies: 2/5>>Chest x-ray: Left lower pneumonia, bilateral hilar fullness 2/6>> bilateral lower extremity Doppler: No DVT  COVID-19 medications: Paxlovid:2/2>>  Antibiotics: Rocephin: 2/6>>  Microbiology data: 2/5 >>blood culture: Streptococcus pneumonae  Procedures: None  Consults: None  DVT prophylaxis: enoxaparin (LOVENOX) injection 40 mg Start: 05/29/20 2000    Subjective:   Feels much better-actually slept last night after a few days.  Less rattling in the chest.  No shortness of breath.   Assessment  & Plan :   Sepsis due to lobar pneumonia and pneumococcal bacteremia: Sepsis physiology has markedly improved-minimal tachycardia persists.  Renal failure has essentially resolved.  Continue IV Rocephin-await echo.  May need to repeat cultures.  Await final culture results.  AKI:  Hemodynamically mediated due to sepsis/bacteremia-significantly better with IV fluids.  Follow closely.   Hypomagnesemia/hypokalemia: Due to GI loss-continue replete and recheck.  COVID-19 infection: Suspect that main issues are from pneumococcal bacteremia/PNA-continue Paxlovid.  Stable-on room air-if she worsens-starts developing hypoxemia-we can reassess regarding steroid/Remdesivir   O2 requirements:  SpO2: 96 %  COVID-19 Labs: Recent Labs    05/29/20 1101 05/29/20 1232 05/30/20 0310 05/31/20 0456  DDIMER 0.69*  --  0.80* 1.43*  FERRITIN  --  472* 332*  --   LDH  --  209*  --   --  CRP  --  39.3* 44.2* 41.0*   No results found for: BNP  Recent Labs  Lab 05/29/20 1232 05/31/20 0857  PROCALCITON 23.17 7.70    Lab Results  Component Value Date   SARSCOV2NAA POSITIVE (A) 05/29/2020   [place] Not Detected 07/28/2019   [place] Not Detected 03/24/2019   SARSCOV2NAA Detected (A) 03/07/2019    Elevated D-dimer: Mild-doubt VTE-she apparently was not able to do a VQ scan yesterday-we will cancel VQ scan-and do a lower extremity Doppler.  If lower extremity Doppler negative-with no hypoxia-doubt further work-up will be required.  HTN-BP stable-hold Avapro and chlorthalidone for now.  GERD: Continue PPI  Depression: Continue Effexor  Asthma: Stable-not wheezing-continue bronchodilators.  History of hepatitis C-treated per patient.  Obesity: Estimated body mass index is 36.56 kg/m as calculated from the following:   Height as of this encounter: 5\' 4"  (1.626 m).   Weight as of this encounter: 96.6 kg.      GI prophylaxis: PPI  ABG: No results found for: PHART, PCO2ART, PO2ART, HCO3, TCO2, ACIDBASEDEF, O2SAT  Vent Settings: N/A    Condition - Guarded  Family Communication  : Daughter updated over the phone-she was on the phone with the patient when I was rounding in the room-on 2/7  Code Status :  Full Code  Diet :  Diet Order            Diet  regular Room service appropriate? Yes; Fluid consistency: Thin  Diet effective now                  Disposition Plan  :   Status is: Observation  The patient will require care spanning > 2 midnights and should be moved to inpatient because: Inpatient level of care appropriate due to severity of illness  Dispo: The patient is from: Home              Anticipated d/c is to: Home              Anticipated d/c date is: > 3 days              Patient currently is not medically stable to d/c.   Difficult to place patient No     Barriers to discharge: Ongoing sepsis physiology with pneumonia and pneumococcal bacteremia-not stable for discharge as patient remains on IV antimicrobial therapy.  Antimicorbials  :    Anti-infectives (From admission, onward)   Start     Dose/Rate Route Frequency Ordered Stop   05/31/20 1000  remdesivir 100 mg in sodium chloride 0.9 % 100 mL IVPB  Status:  Discontinued       "Followed by" Linked Group Details   100 mg 200 mL/hr over 30 Minutes Intravenous Daily 05/30/20 0931 05/30/20 1005   05/30/20 2200  nirmatrelvir/ritonavir EUA (PAXLOVID) TABS 2 tablet  Status:  Discontinued        2 tablet Oral 2 times daily 05/30/20 1316 05/30/20 1323   05/30/20 1400  nirmatrelvir/ritonavir EUA (PAXLOVID) TABS 2 tablet        2 tablet Oral 2 times daily 05/30/20 1323 06/01/20 2159   05/30/20 1300  nirmatrelvir/ritonavir EUA (PAXLOVID) TABS 2 tablet  Status:  Discontinued        2 tablet Oral 2 times daily 05/30/20 1208 05/30/20 1316   05/30/20 1030  remdesivir 200 mg in sodium chloride 0.9% 250 mL IVPB  Status:  Discontinued       "Followed by" Linked Group Details   200  mg 580 mL/hr over 30 Minutes Intravenous Once 05/30/20 0931 05/30/20 1005   05/30/20 0815  cefTRIAXone (ROCEPHIN) 2 g in sodium chloride 0.9 % 100 mL IVPB        2 g 200 mL/hr over 30 Minutes Intravenous Every 24 hours 05/30/20 0715        Inpatient Medications  Scheduled Meds: . enoxaparin  (LOVENOX) injection  40 mg Subcutaneous Q24H  . feeding supplement  237 mL Oral BID BM  . gabapentin  100 mg Oral BID  . guaiFENesin  600 mg Oral BID  . levalbuterol  2 puff Inhalation Q8H  . nirmatrelvir/ritonavir EUA  2 tablet Oral BID  . pantoprazole  40 mg Oral Q1200  . sodium chloride flush  3 mL Intravenous Q12H  . venlafaxine XR  37.5 mg Oral Daily   Continuous Infusions: . sodium chloride    . cefTRIAXone (ROCEPHIN)  IV 2 g (05/31/20 0933)  . magnesium sulfate bolus IVPB 4 g (05/31/20 0937)   PRN Meds:.sodium chloride, acetaminophen, albuterol, chlorpheniramine-HYDROcodone, ondansetron **OR** ondansetron (ZOFRAN) IV, oxyCODONE, sodium chloride flush   Time Spent in minutes  25    See all Orders from today for further details   Oren Binet M.D on 05/31/2020 at 10:43 AM  To page go to [email or web address] - use universal password  [facility] -  Office  [phone]    Objective:   Vitals:   05/31/20 0000 05/31/20 0420 05/31/20 0727 05/31/20 0900  BP: 140/90 132/85 113/68   Pulse: 95 90 82   Resp: 18 20 (!) 22   Temp: 97.8 F (36.6 C) 97.9 F (36.6 C) (!) 97.5 F (36.4 C)   TempSrc: Oral Oral Oral   SpO2: 93% 92% 96% 96%  Weight:      Height:        Wt Readings from Last 3 Encounters:  05/29/20 96.6 kg  06/04/17 95.8 kg  03/15/16 91.3 kg     Intake/Output Summary (Last 24 hours) at 05/31/2020 1043 Last data filed at 05/31/2020 0700 Gross per 24 hour  Intake 2104.15 ml  Output -  Net 2104.15 ml     Physical Exam Gen Exam:Alert awake-not in any distress HEENT:atraumatic, normocephalic Chest: B/L clear to auscultation anteriorly CVS:S1S2 regular Abdomen:soft non tender, non distended Extremities:++ edema Neurology: Non focal Skin: no rash   Data Review:    CBC Recent Labs  Lab 05/29/20 1101 05/30/20 0310 05/31/20 0456  WBC 8.6 7.0 8.7  HGB 11.9* 11.0* 10.8*  HCT 35.4* 34.2* 31.8*  PLT 176 172 113*  MCV 86.3 87.9 86.4  MCH 29.0  28.3 29.3  MCHC 33.6 32.2 34.0  RDW 14.0 13.9 14.3  LYMPHSABS 0.5* 1.0 1.5  MONOABS 0.2 0.2 0.3  EOSABS 0.0 0.0 0.1  BASOSABS 0.0 0.0 0.1    Chemistries  Recent Labs  Lab 05/29/20 1101 05/29/20 1232 05/29/20 2019 05/30/20 0310 05/31/20 0456  NA 133*  --  135 135 133*  K 2.7*  --  3.1* 2.8* 3.6  CL 91*  --  97* 99 101  CO2 24  --  23 20* 19*  GLUCOSE 201*  --  132* 139* 90  BUN 32*  --  33* 31* 28*  CREATININE 2.56*  --  2.05* 1.70* 1.24*  CALCIUM 8.3*  --  7.8* 7.9* 8.1*  MG  --  1.3* 1.7 1.9 1.5*  AST  --   --  22 23 26   ALT  --   --  21 20 21  ALKPHOS  --   --  53 61 65  BILITOT  --   --  1.1 0.9 0.5   ------------------------------------------------------------------------------------------------------------------ Recent Labs    05/29/20 1232  TRIG 114    No results found for: HGBA1C ------------------------------------------------------------------------------------------------------------------ No results for input(s): TSH, T4TOTAL, T3FREE, THYROIDAB in the last 72 hours.  Invalid input(s): FREET3 ------------------------------------------------------------------------------------------------------------------ Recent Labs    05/29/20 1232 05/30/20 0310  FERRITIN 472* 332*    Coagulation profile No results for input(s): INR, PROTIME in the last 168 hours.  Recent Labs    05/30/20 0310 05/31/20 0456  DDIMER 0.80* 1.43*    Cardiac Enzymes No results for input(s): CKMB, TROPONINI, MYOGLOBIN in the last 168 hours.  Invalid input(s): CK ------------------------------------------------------------------------------------------------------------------ No results found for: BNP  Micro Results Recent Results (from the past 240 hour(s))  Blood Culture (routine x 2)     Status: Abnormal (Preliminary result)   Collection Time: 05/29/20 12:41 PM   Specimen: BLOOD  Result Value Ref Range Status   Specimen Description   Final    BLOOD RIGHT  ANTECUBITAL Performed at [facility], [address] [place], [place] [address]    Special Requests   Final    BOTTLES DRAWN AEROBIC AND ANAEROBIC Blood Culture adequate volume Performed at [facility], [address] [place], [place] [address]    Culture  Setup Time   Final    GRAM POSITIVE COCCI IN PAIRS IN BOTH AEROBIC AND ANAEROBIC BOTTLES CRITICAL RESULT CALLED TO, READ BACK BY AND VERIFIED WITH: CATHY PIERCE PHARMD @0826  05/30/20 EB    Culture (A)  Final    STREPTOCOCCUS PNEUMONIAE SUSCEPTIBILITIES TO FOLLOW Performed at [facility], [place] [address] [place], [place] [address]    Report Status PENDING  Incomplete  Blood Culture ID Panel (Reflexed)     Status: Abnormal   Collection Time: 05/29/20 12:41 PM  Result Value Ref Range Status   Enterococcus faecalis NOT DETECTED NOT DETECTED Final   Enterococcus Faecium NOT DETECTED NOT DETECTED Final   Listeria monocytogenes NOT DETECTED NOT DETECTED Final   Staphylococcus species NOT DETECTED NOT DETECTED Final   Staphylococcus aureus (BCID) NOT DETECTED NOT DETECTED Final   Staphylococcus epidermidis NOT DETECTED NOT DETECTED Final   Staphylococcus lugdunensis NOT DETECTED NOT DETECTED Final   Streptococcus species DETECTED (A) NOT DETECTED Final    Comment: CRITICAL RESULT CALLED TO, READ BACK BY AND VERIFIED WITH: CATHY PIERCE PHARMD @0826  05/30/20 EB    Streptococcus agalactiae NOT DETECTED NOT DETECTED Final   Streptococcus pneumoniae DETECTED (A) NOT DETECTED Final    Comment: CRITICAL RESULT CALLED TO, READ BACK BY AND VERIFIED WITH: CATHY PIERCE PHARMD @0826  05/30/20 EB    Streptococcus pyogenes NOT DETECTED NOT DETECTED Final   A.calcoaceticus-baumannii NOT DETECTED NOT DETECTED Final   Bacteroides fragilis NOT DETECTED NOT DETECTED Final   Enterobacterales NOT DETECTED NOT DETECTED Final   Enterobacter cloacae complex NOT DETECTED NOT DETECTED Final   Escherichia coli NOT DETECTED NOT  DETECTED Final   Klebsiella aerogenes NOT DETECTED NOT DETECTED Final   Klebsiella oxytoca NOT DETECTED NOT DETECTED Final   Klebsiella pneumoniae NOT DETECTED NOT DETECTED Final   Proteus species NOT DETECTED NOT DETECTED Final   Salmonella species NOT DETECTED NOT DETECTED Final   Serratia marcescens NOT DETECTED NOT DETECTED Final   Haemophilus influenzae NOT DETECTED NOT DETECTED Final   Neisseria meningitidis NOT DETECTED NOT DETECTED Final   Pseudomonas aeruginosa NOT DETECTED  NOT DETECTED Final   Stenotrophomonas maltophilia NOT DETECTED NOT DETECTED Final   Candida albicans NOT DETECTED NOT DETECTED Final   Candida auris NOT DETECTED NOT DETECTED Final   Candida glabrata NOT DETECTED NOT DETECTED Final   Candida krusei NOT DETECTED NOT DETECTED Final   Candida parapsilosis NOT DETECTED NOT DETECTED Final   Candida tropicalis NOT DETECTED NOT DETECTED Final   Cryptococcus neoformans/gattii NOT DETECTED NOT DETECTED Final    Comment: Performed at [facility], [address], [place], [place] [address]  Blood Culture (routine x 2)     Status: None (Preliminary result)   Collection Time: 05/29/20 12:47 PM   Specimen: BLOOD  Result Value Ref Range Status   Specimen Description   Final    BLOOD LEFT ANTECUBITAL Performed at [facility], [address] [place], [place] [address]    Special Requests   Final    BOTTLES DRAWN AEROBIC AND ANAEROBIC Blood Culture adequate volume Performed at [facility], [address] [place], [place] [address]    Culture  Setup Time   Final    GRAM POSITIVE COCCI AEROBIC BOTTLE ONLY CRITICAL VALUE NOTED.  VALUE IS CONSISTENT WITH PREVIOUSLY REPORTED AND CALLED VALUE.    Culture   Final    GRAM POSITIVE COCCI IN PAIRS CULTURE REINCUBATED FOR BETTER GROWTH Performed at [facility], [address] [place], [place] [address]    Report Status PENDING  Incomplete  SARS Coronavirus 2 by RT PCR (hospital order,  performed in [facility] hospital lab) Nasopharyngeal Nasopharyngeal Swab     Status: Abnormal   Collection Time: 05/29/20 12:52 PM   Specimen: Nasopharyngeal Swab  Result Value Ref Range Status   SARS Coronavirus 2 POSITIVE (A) NEGATIVE Final    Comment: RESULT CALLED TO, READ BACK BY AND VERIFIED WITH:  [email or web address],RN @ 1413 ON 05/29/2020, [email or web address] (NOTE) SARS-CoV-2 target nucleic acids are DETECTED  SARS-CoV-2 RNA is generally detectable in upper respiratory specimens  during the acute phase of infection.  Positive results are indicative  of the presence of the identified virus, but do not rule out bacterial infection or co-infection with other pathogens not detected by the test.  Clinical correlation with patient history and  other diagnostic information is necessary to determine patient infection status.  The expected result is negative.  Fact Sheet for Patients:   BoilerBrush.com.cy   Fact Sheet for Healthcare Providers:   https://pope.com/    This test is not yet approved or cleared by the [place] FDA and  has been authorized for detection and/or diagnosis of SARS-CoV-2 by FDA under an Emergency Use Authorization (EUA).  This EUA will remain in effect (mea ning this test can be used) for the duration of  the COVID-19 declaration under Section 564(b)(1) of the Act, 21 U.S.C. section 360-bbb-3(b)(1), unless the authorization is terminated or revoked sooner.  Performed at [facility], [address], [place], [place] [address]     Radiology Reports DG Chest Portable 1 View  Result Date: 05/29/2020 CLINICAL DATA:  COVID positive.  Shortness of breath.  Weakness. EXAM: PORTABLE CHEST 1 VIEW COMPARISON:  Two-view chest x-ray 01/13/2015 FINDINGS: Heart size is normal. Hilar fullness present bilaterally. Asymmetric left lower lobe airspace opacities are present. Upper lung fields are clear. IMPRESSION: 1.  Asymmetric left lower lobe airspace disease compatible with pneumonia. 2. Bilateral hilar fullness. Underlying adenopathy is not excluded. Electronically Signed   By:  San Morelle M.D.   On: 05/29/2020 11:35   VAS [place] LOWER EXTREMITY VENOUS (DVT)  Result Date: 05/30/2020  Lower Venous DVT Study Indications: Covid.  Risk Factors: Surgery Patient reports partial LT knee replacement in January. Anticoagulation: Lovenox. Comparison       11-05-2015 Bilateral lower extremity venous was negative for Study:           DVT. Performing Technologist: Darlin Coco RDMS  Examination Guidelines: A complete evaluation includes B-mode imaging, spectral Doppler, color Doppler, and power Doppler as needed of all accessible portions of each vessel. Bilateral testing is considered an integral part of a complete examination. Limited examinations for reoccurring indications may be performed as noted. The reflux portion of the exam is performed with the patient in reverse Trendelenburg.  +---------+---------------+---------+-----------+----------+--------------+ RIGHT    CompressibilityPhasicitySpontaneityPropertiesThrombus Aging +---------+---------------+---------+-----------+----------+--------------+ CFV      Full           Yes      Yes                                 +---------+---------------+---------+-----------+----------+--------------+ SFJ      Full                                                        +---------+---------------+---------+-----------+----------+--------------+ FV Prox  Full                                                        +---------+---------------+---------+-----------+----------+--------------+ FV Mid   Full                                                        +---------+---------------+---------+-----------+----------+--------------+ FV DistalFull                                                         +---------+---------------+---------+-----------+----------+--------------+ PFV      Full                                                        +---------+---------------+---------+-----------+----------+--------------+ POP      Full           Yes      Yes                                 +---------+---------------+---------+-----------+----------+--------------+ PTV      Full                                                        +---------+---------------+---------+-----------+----------+--------------+  PERO     Full                                                        +---------+---------------+---------+-----------+----------+--------------+   +---------+---------------+---------+-----------+----------+--------------+ LEFT     CompressibilityPhasicitySpontaneityPropertiesThrombus Aging +---------+---------------+---------+-----------+----------+--------------+ CFV      Full           Yes      Yes                                 +---------+---------------+---------+-----------+----------+--------------+ SFJ      Full                                                        +---------+---------------+---------+-----------+----------+--------------+ FV Prox  Full                                                        +---------+---------------+---------+-----------+----------+--------------+ FV Mid   Full                                                        +---------+---------------+---------+-----------+----------+--------------+ FV DistalFull                                                        +---------+---------------+---------+-----------+----------+--------------+ PFV      Full                                                        +---------+---------------+---------+-----------+----------+--------------+ POP      Full           Yes      Yes                                  +---------+---------------+---------+-----------+----------+--------------+ PTV      Full                                                        +---------+---------------+---------+-----------+----------+--------------+ PERO     Full                                                        +---------+---------------+---------+-----------+----------+--------------+  Summary: RIGHT: - There is no evidence of deep vein thrombosis in the lower extremity.  - No cystic structure found in the popliteal fossa.  LEFT: - There is no evidence of deep vein thrombosis in the lower extremity.  - No cystic structure found in the popliteal fossa.  *See table(s) above for measurements and observations. Electronically signed by [name] Martinez MD on 05/30/2020 at 2:31:02 PM.    Final

## 2020-05-31 NOTE — Progress Notes (Signed)
Occupational Therapy Evaluation Patient Details Name: [name] MRN: [id number] DOB: 1955/04/29 Today's Date: 05/31/2020    History of Present Illness 65 y.o. female with PMHx of HTN, hepatitis C, asthma, depression-who tested positive for COVID-19 on 1/30 (Home test)-after multiple family exposures-presented to the hospital with fevers, chills, cough, diarrhea and shortness of breath-she was found to have AKI, left-sided infiltrate-subsequent blood cultures positive for pneumococcus. s/p L patial knee replacement 04-29-20.   Clinical Impression   PTA pt lives with her daughter and son-in-law is is modified independent with ADL, IADL and mobility using her cane. Able to ambulate to the bathroom and complete ADL @ sink level with max HR 121 and SpO2 maintaining above 93 on RA with 1/4 DOE. Pt fatigues easily and states LB ADL are more difficult since her knee replacement and "being sick with Covid". Pt will benefit from education on energy conservation, compensatory strategies for ADL and reducing risk of falls. Completed flutter valve x 5 and incentive spirometer x 5 - able to pull 300 ml. Educated pt on importance of being OOB in chair and proning if able. Will follow acutely but do not anticipate need for OT follow up.     Follow Up Recommendations  No OT follow up;Supervision - Intermittent    Equipment Recommendations  None recommended by OT    Recommendations for Other Services       Precautions / Restrictions Precautions Precautions: Fall;Knee      Mobility Bed Mobility Overal bed mobility: Modified Independent                  Transfers Overall transfer level: Needs assistance   Transfers: Sit to/from Stand Sit to Stand: Min guard              Balance Overall balance assessment: Mild deficits observed, not formally tested (reaching out with B hands)                                         ADL either performed or assessed with clinical  judgement   ADL Overall ADL's : Needs assistance/impaired     Grooming: Set up;Supervision/safety;Standing   Upper Body Bathing: Set up;Sitting   Lower Body Bathing: Minimal assistance;Sit to/from stand   Upper Body Dressing : Set up;Sitting   Lower Body Dressing: Minimal assistance;Sit to/from stand   Toilet Transfer: Minimal assistance;Ambulation (steady Assist; cane level)   Toileting- Clothing Manipulation and Hygiene: Supervision/safety;Sit to/from stand       Functional mobility during ADLs: Minimal assistance;Cane General ADL Comments: LB ADL are more difficult and iincreased SOB with ADL; may be more steady with RW     Vision Baseline Vision/History: No visual deficits       Perception     Praxis      Pertinent Vitals/Pain Pain Assessment: No/denies pain     Hand Dominance Right   Extremity/Trunk Assessment Upper Extremity Assessment Upper Extremity Assessment: Overall WFL for tasks assessed   Lower Extremity Assessment Lower Extremity Assessment: Defer to PT evaluation (L partial knee replacement)   Cervical / Trunk Assessment Cervical / Trunk Assessment: Normal   Communication Communication Communication: No difficulties   Cognition Arousal/Alertness: Awake/alert Behavior During Therapy: WFL for tasks assessed/performed Overall Cognitive Status: Within Functional Limits for tasks assessed  General Comments  HR 110; SpO2 95 with activity HR 121    Exercises Exercises: Other exercises Other Exercises Other Exercises: flutter valve x 5; incnetive spirometer x 5. Able to pull @ 300 ml Other Exercises: encouraged prone and upright position if able   Shoulder Instructions      Home Living Family/patient expects to be discharged to:: Private residence Living Arrangements: Children (daughter adn son in law) Available Help at Discharge: Family;Available 24 hours/day Type of Home: House Home  Access: Stairs to enter [employer] of Steps: 1 Entrance Stairs-Rails: None Home Layout: Two level;Able to live on main level with bedroom/bathroom     Bathroom Shower/Tub: Occupational psychologist: Handicapped height Bathroom Accessibility: Yes How Accessible: Accessible via walker Home Equipment: Shower seat;Bedside commode;Cane - single point;Walker - 2 wheels          Prior Functioning/Environment Level of Independence: Independent with assistive device(s)        Comments: drives; independent with IADL tasks        OT Problem List: Decreased activity tolerance;Decreased strength;Impaired balance (sitting and/or standing);Decreased knowledge of use of DME or AE;Cardiopulmonary status limiting activity;Obesity      OT Treatment/Interventions: Self-care/ADL training;Therapeutic exercise;Neuromuscular education;Energy conservation;DME and/or AE instruction;Therapeutic activities;Patient/family education    OT Goals(Current goals can be found in the care plan section) Acute Rehab OT Goals Patient Stated Goal: to get better adn go home OT Goal Formulation: With patient Time For Goal Achievement: 06/14/20 Potential to Achieve Goals: Good  OT Frequency: Min 2X/week   Barriers to D/C:            Co-evaluation              AM-PAC OT "6 Clicks" Daily Activity     Outcome Measure Help from another person eating meals?: None Help from another person taking care of personal grooming?: A Little Help from another person toileting, which includes using toliet, bedpan, or urinal?: A Little Help from another person bathing (including washing, rinsing, drying)?: A Little Help from another person to put on and taking off regular upper body clothing?: A Little Help from another person to put on and taking off regular lower body clothing?: A Little 6 Click Score: 19   End of Session Nurse Communication: Mobility status  Activity Tolerance: Patient  tolerated treatment well Patient left: in chair;with call bell/phone within reach  OT Visit Diagnosis: Unsteadiness on feet (R26.81);Other abnormalities of gait and mobility (R26.89);Muscle weakness (generalized) (M62.81)                Time: 1030-1059 OT Time Calculation (min): 29 min Charges:  OT General Charges $OT Visit: 1 Visit OT Evaluation $OT Eval Moderate Complexity: 1 Mod OT Treatments $Self Care/Home Management : 8-22 mins  Maurie Boettcher, OT/L   Acute OT Clinical Specialist Acute Rehabilitation Services Pager [phone] Office [phone]   [facility] 05/31/2020, 11:07 AM

## 2020-05-31 NOTE — Procedures (Signed)
Patient would like to try echo tomorrow since she has not been able to lie still.

## 2020-06-01 ENCOUNTER — Inpatient Hospital Stay (HOSPITAL_COMMUNITY): Payer: Managed Care, Other (non HMO)

## 2020-06-01 DIAGNOSIS — R7881 Bacteremia: Secondary | ICD-10-CM | POA: Diagnosis not present

## 2020-06-01 DIAGNOSIS — U071 COVID-19: Secondary | ICD-10-CM | POA: Diagnosis not present

## 2020-06-01 DIAGNOSIS — I10 Essential (primary) hypertension: Secondary | ICD-10-CM | POA: Diagnosis not present

## 2020-06-01 DIAGNOSIS — N179 Acute kidney failure, unspecified: Secondary | ICD-10-CM | POA: Diagnosis not present

## 2020-06-01 LAB — COMPREHENSIVE METABOLIC PANEL
ALT: 23 U/L (ref 0–44)
AST: 22 U/L (ref 15–41)
Albumin: 2.6 g/dL — ABNORMAL LOW (ref 3.5–5.0)
Alkaline Phosphatase: 70 U/L (ref 38–126)
Anion gap: 12 (ref 5–15)
BUN: 19 mg/dL (ref 8–23)
CO2: 26 mmol/L (ref 22–32)
Calcium: 8.7 mg/dL — ABNORMAL LOW (ref 8.9–10.3)
Chloride: 98 mmol/L (ref 98–111)
Creatinine, Ser: 1.02 mg/dL — ABNORMAL HIGH (ref 0.44–1.00)
GFR, Estimated: >60 mL/min (ref >60)
Glucose, Bld: 110 mg/dL — ABNORMAL HIGH (ref 70–99)
Potassium: 3.4 mmol/L — ABNORMAL LOW (ref 3.5–5.1)
Sodium: 136 mmol/L (ref 135–145)
Total Bilirubin: 0.6 mg/dL (ref 0.3–1.2)
Total Protein: 6.4 g/dL — ABNORMAL LOW (ref 6.5–8.1)

## 2020-06-01 LAB — ECHOCARDIOGRAM LIMITED
Area-P 1/2: 3.72 cm2
Calc EF: 53 %
Height: 64 in
S' Lateral: 3.1 cm
Single Plane A2C EF: 55.7 %
Single Plane A4C EF: 54.6 %
Weight: 3408 oz

## 2020-06-01 LAB — URINALYSIS, ROUTINE W REFLEX MICROSCOPIC
Bilirubin Urine: NEGATIVE
Glucose, UA: NEGATIVE mg/dL
Hgb urine dipstick: NEGATIVE
Ketones, ur: NEGATIVE mg/dL
Leukocytes,Ua: NEGATIVE
Nitrite: NEGATIVE
Protein, ur: NEGATIVE mg/dL
Specific Gravity, Urine: 1.029 (ref 1.005–1.030)
pH: 5 (ref 5.0–8.0)

## 2020-06-01 LAB — CULTURE, BLOOD (ROUTINE X 2): Special Requests: ADEQUATE

## 2020-06-01 LAB — CBC WITH DIFFERENTIAL/PLATELET
Abs Immature Granulocytes: 0.5 10*3/uL — ABNORMAL HIGH (ref 0.00–0.07)
Basophils Absolute: 0.1 10*3/uL (ref 0.0–0.1)
Basophils Relative: 1 %
Eosinophils Absolute: 0.1 10*3/uL (ref 0.0–0.5)
Eosinophils Relative: 1 %
HCT: 30.8 % — ABNORMAL LOW (ref 36.0–46.0)
Hemoglobin: 10.4 g/dL — ABNORMAL LOW (ref 12.0–15.0)
Immature Granulocytes: 4 %
Lymphocytes Relative: 10 %
Lymphs Abs: 1.4 10*3/uL (ref 0.7–4.0)
MCH: 29.1 pg (ref 26.0–34.0)
MCHC: 33.8 g/dL (ref 30.0–36.0)
MCV: 86 fL (ref 80.0–100.0)
Monocytes Absolute: 1 10*3/uL (ref 0.1–1.0)
Monocytes Relative: 7 %
Neutro Abs: 10.7 10*3/uL — ABNORMAL HIGH (ref 1.7–7.7)
Neutrophils Relative %: 77 %
Platelets: 269 10*3/uL (ref 150–400)
RBC: 3.58 MIL/uL — ABNORMAL LOW (ref 3.87–5.11)
RDW: 14.5 % (ref 11.5–15.5)
WBC: 13.7 10*3/uL — ABNORMAL HIGH (ref 4.0–10.5)
nRBC: 0 % (ref 0.0–0.2)

## 2020-06-01 LAB — PROCALCITONIN: Procalcitonin: 5.15 ng/mL

## 2020-06-01 LAB — BRAIN NATRIURETIC PEPTIDE: B Natriuretic Peptide: 179.3 pg/mL — ABNORMAL HIGH (ref 0.0–100.0)

## 2020-06-01 LAB — D-DIMER, QUANTITATIVE: D-Dimer, Quant: 1.67 ug/mL-FEU — ABNORMAL HIGH (ref 0.00–0.50)

## 2020-06-01 LAB — C-REACTIVE PROTEIN: CRP: 33.1 mg/dL — ABNORMAL HIGH (ref <1.0)

## 2020-06-01 LAB — MAGNESIUM: Magnesium: 2.6 mg/dL — ABNORMAL HIGH (ref 1.7–2.4)

## 2020-06-01 MED ORDER — METOPROLOL TARTRATE 12.5 MG HALF TABLET
12.5000 mg | ORAL_TABLET | Freq: Two times a day (BID) | ORAL | Status: DC
  Administered 2020-06-01 (×2): 12.5 mg via ORAL
  Filled 2020-06-01 (×2): qty 1

## 2020-06-01 MED ORDER — POTASSIUM CHLORIDE CRYS ER 20 MEQ PO TBCR
40.0000 meq | EXTENDED_RELEASE_TABLET | Freq: Once | ORAL | Status: AC
  Administered 2020-06-01: 40 meq via ORAL
  Filled 2020-06-01: qty 2

## 2020-06-01 MED ORDER — ACETAMINOPHEN 500 MG PO TABS
1000.0000 mg | ORAL_TABLET | Freq: Three times a day (TID) | ORAL | Status: DC
  Administered 2020-06-01 – 2020-06-05 (×9): 1000 mg via ORAL
  Filled 2020-06-01 (×9): qty 2

## 2020-06-01 MED ORDER — PERFLUTREN LIPID MICROSPHERE
1.0000 mL | INTRAVENOUS | Status: AC | PRN
  Administered 2020-06-01: 2 mL via INTRAVENOUS
  Filled 2020-06-01: qty 10

## 2020-06-01 MED ORDER — IOHEXOL 350 MG/ML SOLN
100.0000 mL | Freq: Once | INTRAVENOUS | Status: AC | PRN
  Administered 2020-06-01: 75 mL via INTRAVENOUS

## 2020-06-01 NOTE — Progress Notes (Signed)
  Echocardiogram 2D Echocardiogram limited with defintiy has been performed.  Darlina Sicilian M 06/01/2020, 9:10 AM

## 2020-06-01 NOTE — Progress Notes (Signed)
PROGRESS NOTE                                                                                                                                                                                                             Patient Demographics:    [name], is a 65 y.o. female, DOB - 07-11-1955, LKT:[id number]  Outpatient Primary MD for the patient is Lujean Amel, MD   Admit date - 05/29/2020   LOS - 2  Chief Complaint  Patient presents with  . Shortness of Breath       Brief Narrative: Patient is a 65 y.o. female with PMHx of HTN, hepatitis C, asthma, depression-who tested positive for COVID-19 on 1/30 (Home test)-after multiple family exposures-presented to the hospital with fevers, chills, cough, diarrhea and shortness of breath-she was found to have AKI, left-sided infiltrate-subsequent blood cultures positive for pneumococcus.  See below for further details.   COVID-19 vaccinated status: Unvaccinated  Significant Events: 2/5>> Admit to [facility] for AKI, PNA-blood cultures positive for pneumococcus 2/8>> left-sided pleuritic chest pain/2 L oxygen requirement/tachycardic  Significant studies: 2/5>>Chest x-ray: Left lower pneumonia, bilateral hilar fullness 2/6>> bilateral lower extremity Doppler: No DVT 2/8>> CTA chest: No PE, dense LLE-small parapneumonic effusion 2/8>>Echo: pending  COVID-19 medications: Paxlovid:2/2>>2/8  Antibiotics: Rocephin: 2/6>>  Microbiology data: 2/5 >>blood culture: Streptococcus pneumonae  Procedures: None  Consults: None  DVT prophylaxis: enoxaparin (LOVENOX) injection 40 mg Start: 05/29/20 2000    Subjective:   Developed left-sided pleuritic chest pain overnight-tachycardic to the 120s-30s-on 2 L of oxygen last night.   Assessment  & Plan :   Sepsis due to lobar pneumonia and pneumococcal bacteremia: Sepsis physiology has improved-some tachycardia persists-repeat  cultures today-continue Rocephin for 1 additional day before transitioning to oral antimicrobial therapy.  Await echo.    Left-sided pleuritic chest pain: Also reproducible-suspect this is either from musculoskeletal etiology from coughing or pleurisy due to dense left lower lobe pneumonia/effusion.  CTA chest done before negative for PE-confirms small parapneumonic effusion and dense left lower lobe infiltrate.  Will obtain 12-lead EKG as well but doubt ACS.   AKI: Hemodynamically mediated due to sepsis/bacteremia-significantly better with IV fluids.  Follow closely.   Lower extremity edema: Check UA-await echo-LFTs reasonable-has mildly elevated BNP-given 1 dose of Lasix on 2/7.  Hold Lasix as patient will get CTA chest today.  Hypomagnesemia: Repleted  Hypokalemia:  replete and recheck.  COVID-19 infection: Suspect that main issues are from pneumococcal bacteremia/PNA-has finished a course of Paxlovid.  Stable-on room air-if she worsens-starts developing hypoxemia-we can reassess regarding steroid/Remdesivir.  Significantly elevated CRP but downtrending-this is more from pneumonia/bacteremia rather than COVID-19 infection.  O2 requirements:  SpO2: 98 % O2 Flow Rate (L/min): (S) 2 L/min  COVID-19 Labs: Recent Labs    05/29/20 1232 05/30/20 0310 05/31/20 0456 06/01/20 0215  DDIMER  --  0.80* 1.43* 1.67*  FERRITIN 472* 332*  --   --   LDH 209*  --   --   --   CRP 39.3* 44.2* 41.0* 33.1*      Component Value Date/Time   BNP 179.3 (H) 06/01/2020 0803    Recent Labs  Lab 05/29/20 1232 05/31/20 0857 06/01/20 0215  PROCALCITON 23.17 7.70 5.15    Lab Results  Component Value Date   SARSCOV2NAA POSITIVE (A) 05/29/2020   [place] Not Detected 07/28/2019   [place] Not Detected 03/24/2019   SARSCOV2NAA Detected (A) 03/07/2019    Elevated D-dimer: Mild-Doppler/CTA chest negative.  On prophylactic Lovenox.  HTN-BP slowly creeping up-we will start low-dose beta-blocker.   Continue to hold Avapro and chlorthalidone for now.    GERD: Continue PPI  Depression: Continue Effexor  Asthma: Stable-not wheezing-continue bronchodilators.  History of hepatitis C-treated per patient.  Obesity: Estimated body mass index is 36.56 kg/m as calculated from the following:   Height as of this encounter: 5\' 4"  (1.626 m).   Weight as of this encounter: 96.6 kg.      GI prophylaxis: PPI  ABG: No results found for: PHART, PCO2ART, PO2ART, HCO3, TCO2, ACIDBASEDEF, O2SAT  Vent Settings: N/A    Condition - Guarded  Family Communication  : Daughter-[phone] -on 2/8  Code Status :  Full Code  Diet :  Diet Order            Diet regular Room service appropriate? Yes; Fluid consistency: Thin  Diet effective now                  Disposition Plan  :   Status is: Observation  The patient will require care spanning > 2 midnights and should be moved to inpatient because: Inpatient level of care appropriate due to severity of illness  Dispo: The patient is from: Home              Anticipated d/c is to: Home              Anticipated d/c date is: > 3 days              Patient currently is not medically stable to d/c.   Difficult to place patient No     Barriers to discharge: Ongoing sepsis physiology with pneumonia and pneumococcal bacteremia-not stable for discharge as patient remains on IV antimicrobial therapy.  Antimicorbials  :    Anti-infectives (From admission, onward)   Start     Dose/Rate Route Frequency Ordered Stop   05/31/20 1000  remdesivir 100 mg in sodium chloride 0.9 % 100 mL IVPB  Status:  Discontinued       "Followed by" Linked Group Details   100 mg 200 mL/hr over 30 Minutes Intravenous Daily 05/30/20 0931 05/30/20 1005   05/30/20 2200  nirmatrelvir/ritonavir EUA (PAXLOVID) TABS 2 tablet  Status:  Discontinued        2 tablet Oral 2 times daily 05/30/20 1316 05/30/20 1323   05/30/20  1400  nirmatrelvir/ritonavir EUA (PAXLOVID)  TABS 2 tablet        2 tablet Oral 2 times daily 05/30/20 1323 06/01/20 1035   05/30/20 1300  nirmatrelvir/ritonavir EUA (PAXLOVID) TABS 2 tablet  Status:  Discontinued        2 tablet Oral 2 times daily 05/30/20 1208 05/30/20 1316   05/30/20 1030  remdesivir 200 mg in sodium chloride 0.9% 250 mL IVPB  Status:  Discontinued       "Followed by" Linked Group Details   200 mg 580 mL/hr over 30 Minutes Intravenous Once 05/30/20 0931 05/30/20 1005   05/30/20 0815  cefTRIAXone (ROCEPHIN) 2 g in sodium chloride 0.9 % 100 mL IVPB        2 g 200 mL/hr over 30 Minutes Intravenous Every 24 hours 05/30/20 0715        Inpatient Medications  Scheduled Meds: . acetaminophen  1,000 mg Oral Q8H  . benzonatate  200 mg Oral TID  . enoxaparin (LOVENOX) injection  40 mg Subcutaneous Q24H  . feeding supplement  237 mL Oral BID BM  . gabapentin  100 mg Oral BID  . guaiFENesin  600 mg Oral BID  . levalbuterol  2 puff Inhalation Q8H  . pantoprazole  40 mg Oral BID  . sodium chloride flush  3 mL Intravenous Q12H  . venlafaxine XR  37.5 mg Oral Daily   Continuous Infusions: . sodium chloride    . cefTRIAXone (ROCEPHIN)  IV Stopped (05/31/20 1930)   PRN Meds:.sodium chloride, albuterol, alum & mag hydroxide-simeth, chlorpheniramine-HYDROcodone, ondansetron **OR** ondansetron (ZOFRAN) IV, oxyCODONE, sodium chloride flush   Time Spent in minutes  25    See all Orders from today for further details   Oren Binet M.D on 06/01/2020 at 12:00 PM  To page go to [email or web address] - use universal password  [facility] -  Office  [phone]    Objective:   Vitals:   05/31/20 2355 06/01/20 0416 06/01/20 1016 06/01/20 1144  BP: (!) 154/100 132/85 (!) 143/94 (!) 143/87  Pulse: (!) 101 100 (!) 108 (!) 105  Resp: 20 19 20 20   Temp: 97.8 F (36.6 C) 98 F (36.7 C) 98.3 F (36.8 C) 98.2 F (36.8 C)  TempSrc: Oral Oral Oral Oral  SpO2:   93% 98%  Weight:      Height:        Wt Readings from  Last 3 Encounters:  05/29/20 96.6 kg  06/04/17 95.8 kg  03/15/16 91.3 kg    No intake or output data in the 24 hours ending 06/01/20 1200   Physical Exam Gen Exam:Alert awake-not in any distress HEENT:atraumatic, normocephalic Chest: B/L clear to auscultation anteriorly CVS:S1S2 regular Abdomen:soft non tender, non distended Extremities:+ edema Neurology: Non focal Skin: no rash   Data Review:    CBC Recent Labs  Lab 05/29/20 1101 05/30/20 0310 05/31/20 0456 06/01/20 0215  WBC 8.6 7.0 8.7 13.7*  HGB 11.9* 11.0* 10.8* 10.4*  HCT 35.4* 34.2* 31.8* 30.8*  PLT 176 172 113* 269  MCV 86.3 87.9 86.4 86.0  MCH 29.0 28.3 29.3 29.1  MCHC 33.6 32.2 34.0 33.8  RDW 14.0 13.9 14.3 14.5  LYMPHSABS 0.5* 1.0 1.5 1.4  MONOABS 0.2 0.2 0.3 1.0  EOSABS 0.0 0.0 0.1 0.1  BASOSABS 0.0 0.0 0.1 0.1    Chemistries  Recent Labs  Lab 05/29/20 1101 05/29/20 1232 05/29/20 2019 05/30/20 0310 05/31/20 0456 06/01/20 0215  NA 133*  --  135 135 133* 136  K 2.7*  --  3.1* 2.8* 3.6 3.4*  CL 91*  --  97* 99 101 98  CO2 24  --  23 20* 19* 26  GLUCOSE 201*  --  132* 139* 90 110*  BUN 32*  --  33* 31* 28* 19  CREATININE 2.56*  --  2.05* 1.70* 1.24* 1.02*  CALCIUM 8.3*  --  7.8* 7.9* 8.1* 8.7*  MG  --  1.3* 1.7 1.9 1.5* 2.6*  AST  --   --  22 23 26 22   ALT  --   --  21 20 21 23   ALKPHOS  --   --  53 61 65 70  BILITOT  --   --  1.1 0.9 0.5 0.6   ------------------------------------------------------------------------------------------------------------------ Recent Labs    05/29/20 1232  TRIG 114    No results found for: HGBA1C ------------------------------------------------------------------------------------------------------------------ No results for input(s): TSH, T4TOTAL, T3FREE, THYROIDAB in the last 72 hours.  Invalid input(s): FREET3 ------------------------------------------------------------------------------------------------------------------ Recent Labs     05/29/20 1232 05/30/20 0310  FERRITIN 472* 332*    Coagulation profile No results for input(s): INR, PROTIME in the last 168 hours.  Recent Labs    05/31/20 0456 06/01/20 0215  DDIMER 1.43* 1.67*    Cardiac Enzymes No results for input(s): CKMB, TROPONINI, MYOGLOBIN in the last 168 hours.  Invalid input(s): CK ------------------------------------------------------------------------------------------------------------------    Component Value Date/Time   BNP 179.3 (H) 06/01/2020 0803    Micro Results Recent Results (from the past 240 hour(s))  Blood Culture (routine x 2)     Status: Abnormal   Collection Time: 05/29/20 12:41 PM   Specimen: BLOOD  Result Value Ref Range Status   Specimen Description   Final    BLOOD RIGHT ANTECUBITAL Performed at [facility], [address] [place], [place] [address]    Special Requests   Final    BOTTLES DRAWN AEROBIC AND ANAEROBIC Blood Culture adequate volume Performed at [facility], [address] [place], [place] [address]    Culture  Setup Time   Final    GRAM POSITIVE COCCI IN PAIRS IN BOTH AEROBIC AND ANAEROBIC BOTTLES CRITICAL RESULT CALLED TO, READ BACK BY AND VERIFIED WITH: CATHY PIERCE PHARMD @0826  05/30/20 EB Performed at [facility], [place] [address] [place], [place] [address]    Culture STREPTOCOCCUS PNEUMONIAE (A)  Final   Report Status 06/01/2020 FINAL  Final   Organism ID, Bacteria STREPTOCOCCUS PNEUMONIAE  Final      Susceptibility   Streptococcus pneumoniae - MIC*    ERYTHROMYCIN RESISTANT Resistant     LEVOFLOXACIN 0.5 SENSITIVE Sensitive     VANCOMYCIN <=0.12 SENSITIVE Sensitive     PENICILLIN (meningitis) <=0.06 SENSITIVE Sensitive     PENO - penicillin <=0.06      PENICILLIN (non-meningitis) <=0.06 SENSITIVE Sensitive     PENICILLIN (oral) <=0.06 SENSITIVE Sensitive     CEFTRIAXONE (non-meningitis) <=0.12 SENSITIVE Sensitive     CEFTRIAXONE (meningitis) <=0.12 SENSITIVE  Sensitive     * STREPTOCOCCUS PNEUMONIAE  Blood Culture ID Panel (Reflexed)     Status: Abnormal   Collection Time: 05/29/20 12:41 PM  Result Value Ref Range Status   Enterococcus faecalis NOT DETECTED NOT DETECTED Final   Enterococcus Faecium NOT DETECTED NOT DETECTED Final   Listeria monocytogenes NOT DETECTED NOT DETECTED Final   Staphylococcus species NOT DETECTED NOT DETECTED Final   Staphylococcus aureus (BCID) NOT DETECTED NOT DETECTED Final   Staphylococcus epidermidis NOT DETECTED  NOT DETECTED Final   Staphylococcus lugdunensis NOT DETECTED NOT DETECTED Final   Streptococcus species DETECTED (A) NOT DETECTED Final    Comment: CRITICAL RESULT CALLED TO, READ BACK BY AND VERIFIED WITH: CATHY PIERCE PHARMD @0826  05/30/20 EB    Streptococcus agalactiae NOT DETECTED NOT DETECTED Final   Streptococcus pneumoniae DETECTED (A) NOT DETECTED Final    Comment: CRITICAL RESULT CALLED TO, READ BACK BY AND VERIFIED WITH: CATHY PIERCE PHARMD @0826  05/30/20 EB    Streptococcus pyogenes NOT DETECTED NOT DETECTED Final   A.calcoaceticus-baumannii NOT DETECTED NOT DETECTED Final   Bacteroides fragilis NOT DETECTED NOT DETECTED Final   Enterobacterales NOT DETECTED NOT DETECTED Final   Enterobacter cloacae complex NOT DETECTED NOT DETECTED Final   Escherichia coli NOT DETECTED NOT DETECTED Final   Klebsiella aerogenes NOT DETECTED NOT DETECTED Final   Klebsiella oxytoca NOT DETECTED NOT DETECTED Final   Klebsiella pneumoniae NOT DETECTED NOT DETECTED Final   Proteus species NOT DETECTED NOT DETECTED Final   Salmonella species NOT DETECTED NOT DETECTED Final   Serratia marcescens NOT DETECTED NOT DETECTED Final   Haemophilus influenzae NOT DETECTED NOT DETECTED Final   Neisseria meningitidis NOT DETECTED NOT DETECTED Final   Pseudomonas aeruginosa NOT DETECTED NOT DETECTED Final   Stenotrophomonas maltophilia NOT DETECTED NOT DETECTED Final   Candida albicans NOT DETECTED NOT DETECTED Final    Candida auris NOT DETECTED NOT DETECTED Final   Candida glabrata NOT DETECTED NOT DETECTED Final   Candida krusei NOT DETECTED NOT DETECTED Final   Candida parapsilosis NOT DETECTED NOT DETECTED Final   Candida tropicalis NOT DETECTED NOT DETECTED Final   Cryptococcus neoformans/gattii NOT DETECTED NOT DETECTED Final    Comment: Performed at [facility], [address], [place], [place] [address]  Blood Culture (routine x 2)     Status: Abnormal (Preliminary result)   Collection Time: 05/29/20 12:47 PM   Specimen: BLOOD  Result Value Ref Range Status   Specimen Description   Final    BLOOD LEFT ANTECUBITAL Performed at [facility], [address] [place], [place] [address]    Special Requests   Final    BOTTLES DRAWN AEROBIC AND ANAEROBIC Blood Culture adequate volume Performed at [facility], [address] [place], [place] [address]    Culture  Setup Time   Final    GRAM POSITIVE COCCI AEROBIC BOTTLE ONLY CRITICAL VALUE NOTED.  VALUE IS CONSISTENT WITH PREVIOUSLY REPORTED AND CALLED VALUE.    Culture (A)  Final    STREPTOCOCCUS PNEUMONIAE SUSCEPTIBILITIES PERFORMED ON PREVIOUS CULTURE WITHIN THE LAST 5 DAYS. Performed at [facility], [place] [address] [place],  [address]    Report Status PENDING  Incomplete  SARS Coronavirus 2 by RT PCR (hospital order, performed in [facility] hospital lab) Nasopharyngeal Nasopharyngeal Swab     Status: Abnormal   Collection Time: 05/29/20 12:52 PM   Specimen: Nasopharyngeal Swab  Result Value Ref Range Status   SARS Coronavirus 2 POSITIVE (A) NEGATIVE Final    Comment: RESULT CALLED TO, READ BACK BY AND VERIFIED WITH:  [email or web address],RN @ 1413 ON 05/29/2020, [email or web address] (NOTE) SARS-CoV-2 target nucleic acids are DETECTED  SARS-CoV-2 RNA is generally detectable in upper respiratory specimens  during the acute phase of infection.  Positive results are indicative  of the presence of the identified  virus, but do not rule out bacterial infection or co-infection with other pathogens not detected by the test.  Clinical  correlation with patient history and  other diagnostic information is necessary to determine patient infection status.  The expected result is negative.  Fact Sheet for Patients:   StrictlyIdeas.no   Fact Sheet for Healthcare Providers:   BankingDealers.co.za    This test is not yet approved or cleared by the [place] FDA and  has been authorized for detection and/or diagnosis of SARS-CoV-2 by FDA under an Emergency Use Authorization (EUA).  This EUA will remain in effect (mea ning this test can be used) for the duration of  the COVID-19 declaration under Section 564(b)(1) of the Act, 21 U.S.C. section 360-bbb-3(b)(1), unless the authorization is terminated or revoked sooner.  Performed at [facility], [address] [place], [place] [address]     Radiology Reports CT ANGIO CHEST PE W OR WO CONTRAST  Result Date: 06/01/2020 CLINICAL DATA:  COVID pneumonia, left lower lobe consolidation, dyspnea EXAM: CT ANGIOGRAPHY CHEST WITH CONTRAST TECHNIQUE: Multidetector CT imaging of the chest was performed using the standard protocol during bolus administration of intravenous contrast. Multiplanar CT image reconstructions and MIPs were obtained to evaluate the vascular anatomy. CONTRAST:  53mL OMNIPAQUE IOHEXOL 350 MG/ML SOLN COMPARISON:  CT coronary calcium 11/11/2015 FINDINGS: Cardiovascular: Satisfactory opacification of the pulmonary arteries to the segmental level. No evidence of pulmonary embolism. Normal heart size. No pericardial effusion. The central pulmonary arteries are of normal caliber. The thoracic aorta is unremarkable. Mediastinum/Nodes: No enlarged mediastinal, hilar, or axillary lymph nodes. Thyroid gland, trachea, and esophagus demonstrate no significant findings. Lungs/Pleura: There is dense left  lower lobe consolidation in keeping with changes of acute lobar pneumonia in the appropriate clinical setting. There is endobronchial debris resulting in impaction of a the bronchi of the left lower lobe. Mild scattered infiltrate is seen within the basilar lingula and right upper lobe, likely infectious or inflammatory and potentially related to endobronchial spread of disease. A small amount of debris is noted within the right lower lobe bronchus. Small left pleural effusion is present. No pneumothorax. Upper Abdomen: At least moderate hepatic steatosis is noted. Musculoskeletal: No acute bone abnormality. Review of the MIP images confirms the above findings. IMPRESSION: Dense consolidation and airway impaction involving the left lower lobe compatible with changes of acute lobar pneumonia in the appropriate clinical setting. Small parapneumonic effusion noted. Scattered infiltrate within the right upper lobe and basilar lingula as well as endobronchial debris within the left lower lobe possibly representing endobronchial spread of disease. Aspiration could also appear in this fashion, though its asymmetry involving the left lower lobe makes this less likely. No pulmonary embolism Electronically Signed   By: Fidela Salisbury MD   On: 06/01/2020 11:34   DG Chest Portable 1 View  Result Date: 05/29/2020 CLINICAL DATA:  COVID positive.  Shortness of breath.  Weakness. EXAM: PORTABLE CHEST 1 VIEW COMPARISON:  Two-view chest x-ray 01/13/2015 FINDINGS: Heart size is normal. Hilar fullness present bilaterally. Asymmetric left lower lobe airspace opacities are present. Upper lung fields are clear. IMPRESSION: 1. Asymmetric left lower lobe airspace disease compatible with pneumonia. 2. Bilateral hilar fullness. Underlying adenopathy is not excluded. Electronically Signed   By: San Morelle M.D.   On: 05/29/2020 11:35   DG Chest Port 1V same Day  Result Date: 05/31/2020 CLINICAL DATA:  COVID, shortness of breath  EXAM: PORTABLE CHEST 1 VIEW COMPARISON:  05/29/2020 FINDINGS: Heart is normal size. Airspace opacity noted in the left lower lobe. Small left pleural effusion. No confluent opacity or effusion on  the right. No acute bony abnormality. IMPRESSION: Left lower lobe pneumonia.  Small left effusion. Electronically Signed   By: Rolm Baptise M.D.   On: 05/31/2020 20:23   VAS [place] LOWER EXTREMITY VENOUS (DVT)  Result Date: 05/30/2020  Lower Venous DVT Study Indications: Covid.  Risk Factors: Surgery Patient reports partial LT knee replacement in January. Anticoagulation: Lovenox. Comparison       11-05-2015 Bilateral lower extremity venous was negative for Study:           DVT. Performing Technologist: Darlin Coco RDMS  Examination Guidelines: A complete evaluation includes B-mode imaging, spectral Doppler, color Doppler, and power Doppler as needed of all accessible portions of each vessel. Bilateral testing is considered an integral part of a complete examination. Limited examinations for reoccurring indications may be performed as noted. The reflux portion of the exam is performed with the patient in reverse Trendelenburg.  +---------+---------------+---------+-----------+----------+--------------+ RIGHT    CompressibilityPhasicitySpontaneityPropertiesThrombus Aging +---------+---------------+---------+-----------+----------+--------------+ CFV      Full           Yes      Yes                                 +---------+---------------+---------+-----------+----------+--------------+ SFJ      Full                                                        +---------+---------------+---------+-----------+----------+--------------+ FV Prox  Full                                                        +---------+---------------+---------+-----------+----------+--------------+ FV Mid   Full                                                         +---------+---------------+---------+-----------+----------+--------------+ FV DistalFull                                                        +---------+---------------+---------+-----------+----------+--------------+ PFV      Full                                                        +---------+---------------+---------+-----------+----------+--------------+ POP      Full           Yes      Yes                                 +---------+---------------+---------+-----------+----------+--------------+ PTV      Full                                                        +---------+---------------+---------+-----------+----------+--------------+  PERO     Full                                                        +---------+---------------+---------+-----------+----------+--------------+   +---------+---------------+---------+-----------+----------+--------------+ LEFT     CompressibilityPhasicitySpontaneityPropertiesThrombus Aging +---------+---------------+---------+-----------+----------+--------------+ CFV      Full           Yes      Yes                                 +---------+---------------+---------+-----------+----------+--------------+ SFJ      Full                                                        +---------+---------------+---------+-----------+----------+--------------+ FV Prox  Full                                                        +---------+---------------+---------+-----------+----------+--------------+ FV Mid   Full                                                        +---------+---------------+---------+-----------+----------+--------------+ FV DistalFull                                                        +---------+---------------+---------+-----------+----------+--------------+ PFV      Full                                                         +---------+---------------+---------+-----------+----------+--------------+ POP      Full           Yes      Yes                                 +---------+---------------+---------+-----------+----------+--------------+ PTV      Full                                                        +---------+---------------+---------+-----------+----------+--------------+ PERO     Full                                                        +---------+---------------+---------+-----------+----------+--------------+  Summary: RIGHT: - There is no evidence of deep vein thrombosis in the lower extremity.  - No cystic structure found in the popliteal fossa.  LEFT: - There is no evidence of deep vein thrombosis in the lower extremity.  - No cystic structure found in the popliteal fossa.  *See table(s) above for measurements and observations. Electronically signed by [name] Martinez MD on 05/30/2020 at 2:31:02 PM.    Final

## 2020-06-01 NOTE — Plan of Care (Signed)

## 2020-06-02 DIAGNOSIS — N179 Acute kidney failure, unspecified: Secondary | ICD-10-CM | POA: Diagnosis not present

## 2020-06-02 DIAGNOSIS — I10 Essential (primary) hypertension: Secondary | ICD-10-CM | POA: Diagnosis not present

## 2020-06-02 DIAGNOSIS — U071 COVID-19: Secondary | ICD-10-CM | POA: Diagnosis not present

## 2020-06-02 LAB — COMPREHENSIVE METABOLIC PANEL
ALT: 22 U/L (ref 0–44)
AST: 25 U/L (ref 15–41)
Albumin: 2.5 g/dL — ABNORMAL LOW (ref 3.5–5.0)
Alkaline Phosphatase: 83 U/L (ref 38–126)
Anion gap: 12 (ref 5–15)
BUN: 13 mg/dL (ref 8–23)
CO2: 28 mmol/L (ref 22–32)
Calcium: 9 mg/dL (ref 8.9–10.3)
Chloride: 98 mmol/L (ref 98–111)
Creatinine, Ser: 0.79 mg/dL (ref 0.44–1.00)
GFR, Estimated: >60 mL/min (ref >60)
Glucose, Bld: 107 mg/dL — ABNORMAL HIGH (ref 70–99)
Potassium: 3.4 mmol/L — ABNORMAL LOW (ref 3.5–5.1)
Sodium: 138 mmol/L (ref 135–145)
Total Bilirubin: 0.7 mg/dL (ref 0.3–1.2)
Total Protein: 7 g/dL (ref 6.5–8.1)

## 2020-06-02 LAB — TROPONIN I (HIGH SENSITIVITY): Troponin I (High Sensitivity): 22 ng/L — ABNORMAL HIGH (ref <18)

## 2020-06-02 LAB — CULTURE, BLOOD (ROUTINE X 2): Special Requests: ADEQUATE

## 2020-06-02 LAB — CBC
HCT: 34.1 % — ABNORMAL LOW (ref 36.0–46.0)
Hemoglobin: 10.9 g/dL — ABNORMAL LOW (ref 12.0–15.0)
MCH: 27.9 pg (ref 26.0–34.0)
MCHC: 32 g/dL (ref 30.0–36.0)
MCV: 87.4 fL (ref 80.0–100.0)
Platelets: 329 10*3/uL (ref 150–400)
RBC: 3.9 MIL/uL (ref 3.87–5.11)
RDW: 14.6 % (ref 11.5–15.5)
WBC: 16.2 10*3/uL — ABNORMAL HIGH (ref 4.0–10.5)
nRBC: 0 % (ref 0.0–0.2)

## 2020-06-02 LAB — C-REACTIVE PROTEIN: CRP: 31.9 mg/dL — ABNORMAL HIGH (ref <1.0)

## 2020-06-02 LAB — D-DIMER, QUANTITATIVE: D-Dimer, Quant: 2.17 ug/mL-FEU — ABNORMAL HIGH (ref 0.00–0.50)

## 2020-06-02 LAB — MAGNESIUM: Magnesium: 2 mg/dL (ref 1.7–2.4)

## 2020-06-02 MED ORDER — METOPROLOL TARTRATE 25 MG PO TABS
25.0000 mg | ORAL_TABLET | Freq: Two times a day (BID) | ORAL | Status: DC
  Administered 2020-06-02: 25 mg via ORAL
  Filled 2020-06-02: qty 1

## 2020-06-02 MED ORDER — POTASSIUM CHLORIDE CRYS ER 20 MEQ PO TBCR
40.0000 meq | EXTENDED_RELEASE_TABLET | Freq: Once | ORAL | Status: AC
  Administered 2020-06-02: 40 meq via ORAL
  Filled 2020-06-02: qty 2

## 2020-06-02 MED ORDER — FUROSEMIDE 10 MG/ML IJ SOLN
40.0000 mg | Freq: Once | INTRAMUSCULAR | Status: AC
  Administered 2020-06-02: 40 mg via INTRAVENOUS
  Filled 2020-06-02: qty 4

## 2020-06-02 MED ORDER — METOPROLOL TARTRATE 50 MG PO TABS
50.0000 mg | ORAL_TABLET | Freq: Two times a day (BID) | ORAL | Status: DC
  Administered 2020-06-02 – 2020-06-05 (×6): 50 mg via ORAL
  Filled 2020-06-02 (×6): qty 1

## 2020-06-02 NOTE — Progress Notes (Signed)
Physical Therapy Treatment Patient Details Name: [name] MRN: [id number] DOB: 1955-10-01 Today's Date: 06/02/2020    History of Present Illness 65 y.o. female with PMHx of HTN, hepatitis C, asthma, depression-who tested positive for COVID-19 on 1/30 (Home test)-after multiple family exposures-presented to the hospital with fevers, chills, cough, diarrhea and shortness of breath-she was found to have AKI, left-sided infiltrate-subsequent blood cultures positive for pneumococcus. s/p L patial knee replacement 04-29-20.    PT Comments    Patient now walking in room independently. Educated in standing exercises for knee stability, ROM, and strengthening.    Follow Up Recommendations  Outpatient PT (resume OPPT for TKR)     Equipment Recommendations  None recommended by PT    Recommendations for Other Services       Precautions / Restrictions Precautions Precautions: Fall;Knee    Mobility  Bed Mobility Overal bed mobility: Modified Independent                Transfers Overall transfer level: Independent Equipment used: None Transfers: Sit to/from Stand Sit to Stand: Independent            Ambulation/Gait Ambulation/Gait assistance: Independent Gait Distance (Feet): 30 Feet Assistive device: None Gait Pattern/deviations: Step-through pattern     General Gait Details: walking to/from bathroom on arrival   Stairs             Wheelchair Mobility    Modified Rankin (Stroke Patients Only)       Balance Overall balance assessment: Mild deficits observed, not formally tested (reaching out with B hands)                                          Cognition Arousal/Alertness: Awake/alert Behavior During Therapy: WFL for tasks assessed/performed Overall Cognitive Status: Within Functional Limits for tasks assessed                                        Exercises Total Joint Exercises Hip ABduction/ADduction:  AROM;Both;20 reps;Standing Long Arc Quad: AROM;Left;10 reps Knee Flexion: AROM;Both;10 reps Marching in Standing: AROM;Both;20 reps;Standing Mini-squats x 10 reps Other Exercises Other Exercises: Reports she has been doing her theraband ex's with no questions Other Exercises: IS x 5 reps max 250 ml    General Comments        Pertinent Vitals/Pain Pain Assessment: Faces Faces Pain Scale: Hurts little more Pain Location: left ribs Pain Descriptors / Indicators: Sharp (with coughing) Pain Intervention(s): Limited activity within patient's tolerance;Monitored during session    Home Living                      Prior Function            PT Goals (current goals can now be found in the care plan section) Acute Rehab PT Goals Patient Stated Goal: to get better adn go home PT Goal Formulation: With patient Time For Goal Achievement: 06/14/20 Potential to Achieve Goals: Good Progress towards PT goals: Progressing toward goals    Frequency    Min 3X/week      PT Plan Current plan remains appropriate    Co-evaluation              AM-PAC PT "6 Clicks" Mobility   Outcome Measure  Help needed turning from  your back to your side while in a flat bed without using bedrails?: None Help needed moving from lying on your back to sitting on the side of a flat bed without using bedrails?: None Help needed moving to and from a bed to a chair (including a wheelchair)?: None Help needed standing up from a chair using your arms (e.g., wheelchair or bedside chair)?: None Help needed to walk in hospital room?: None Help needed climbing 3-5 steps with a railing? : A Little 6 Click Score: 23    End of Session   Activity Tolerance: Patient tolerated treatment well Patient left: in chair;with call bell/phone within reach   PT Visit Diagnosis: Other abnormalities of gait and mobility (R26.89);Difficulty in walking, not elsewhere classified (R26.2)     Time: 8850-2774 PT  Time Calculation (min) (ACUTE ONLY): 18 min  Charges:  $Therapeutic Exercise: 8-22 mins                      Arby Barrette, PT Pager [phone]    Rexanne Mano 06/02/2020, 4:33 PM

## 2020-06-02 NOTE — Plan of Care (Signed)
  Problem: Clinical Measurements: Goal: Will remain free from infection Outcome: Progressing Goal: Respiratory complications will improve Outcome: Progressing   Problem: Education: Goal: Knowledge of risk factors and measures for prevention of condition will improve Outcome: Progressing   Problem: Respiratory: Goal: Will maintain a patent airway Outcome: Progressing Goal: Complications related to the disease process, condition or treatment will be avoided or minimized Outcome: Progressing

## 2020-06-02 NOTE — Progress Notes (Signed)
SATURATION QUALIFICATIONS: (This note is used to comply with regulatory documentation for home oxygen)  Patient Saturations on Room Air at Rest = 97%  Patient Saturations on Room Air while Ambulating = 90%  Patient Saturations on 0 Liters of oxygen while Ambulating = 90%  Please briefly explain why patient needs home oxygen:

## 2020-06-02 NOTE — Progress Notes (Signed)
PROGRESS NOTE                                                                                                                                                                                                             Patient Demographics:    [name], is a 65 y.o. female, DOB - [birth year], JJO:[id number]  Outpatient Primary MD for the patient is Lujean Amel, MD   Admit date - 05/29/2020   LOS - 3  Chief Complaint  Patient presents with  . Shortness of Breath       Brief Narrative: Patient is a 65 y.o. female with PMHx of HTN, hepatitis C, asthma, depression-who tested positive for COVID-19 on 1/30 (Home test)-after multiple family exposures-presented to the hospital with fevers, chills, cough, diarrhea and shortness of breath-she was found to have AKI, left-sided infiltrate-subsequent blood cultures positive for pneumococcus.  See below for further details.   COVID-19 vaccinated status: Unvaccinated  Significant Events: 2/5>> Admit to [facility] for AKI, PNA-blood cultures positive for pneumococcus 2/8>> left-sided pleuritic chest pain/2 L oxygen requirement/tachycardic  Significant studies: 2/5>>Chest x-ray: Left lower pneumonia, bilateral hilar fullness 2/6>> bilateral lower extremity Doppler: No DVT 2/8>> CTA chest: No PE, dense LLE-small parapneumonic effusion 2/8>>Echo: EF 60-65%, no wall motion abnormalities, grade 1 diastolic dysfunction  [id number] medications: Paxlovid:2/2>>2/8  Antibiotics: Rocephin: 2/6>>  Microbiology data: 2/5 >>blood culture: Streptococcus pneumonae 2/8>> blood culture: No growth  Procedures: None  Consults: None  DVT prophylaxis: enoxaparin (LOVENOX) injection 40 mg Start: 05/29/20 2000    Subjective:   Main issue continues to be some moderate left-sided pleuritic chest pain.  Some anxiety but appears much more comfortable compared to yesterday   Assessment  & Plan  :   Sepsis due to lobar pneumonia and pneumococcal bacteremia: Sepsis physiology has resolved-continues to have mild tachycardia-repeat blood cultures negative.  Echo without any obvious valvular abnormalities.  Continue Rocephin-suspect that we should be able to transition to oral antibiotics in the next few days.    Left-sided pleuritic chest pain: Likely due to LL PNA-no PE on CTA chest.  Could also possibly be from musculoskeletal strain from coughing.  Supportive care.   AKI: Hemodynamically mediated due to sepsis/bacteremia-resolved.  Lower extremity edema: Probably due to hypoalbuminemia and diastolic CHF.  Repeat IV Lasix today-reassess tomorrow.  Hypomagnesemia: Repleted  Hypokalemia:  Replete  and recheck  COVID-19 infection: Suspect that main issues are from pneumococcal bacteremia/PNA-has finished a course of Paxlovid.  CRP significantly elevated-but this is from pneumococcal pneumonia/bacteremia and is slowly downtrending.  She remains stable on room air-if hypoxemia worsens-and if felt to be due to COVID-19 infection-we will initiate steroids and Remdesivir.  O2 requirements:  SpO2: 94 % O2 Flow Rate (L/min): (S) 2 L/min  COVID-19 Labs: Recent Labs    05/31/20 0456 06/01/20 0215 06/02/20 0053  DDIMER 1.43* 1.67* 2.17*  CRP 41.0* 33.1* 31.9*      Component Value Date/Time   BNP 179.3 (H) 06/01/2020 0803    Recent Labs  Lab 05/29/20 1232 05/31/20 0857 06/01/20 0215  PROCALCITON 23.17 7.70 5.15    Lab Results  Component Value Date   SARSCOV2NAA POSITIVE (A) 05/29/2020   [place] Not Detected 07/28/2019   Early Not Detected 03/24/2019   SARSCOV2NAA Detected (A) 03/07/2019    Elevated D-dimer: Mild-Doppler/CTA chest negative.  On prophylactic Lovenox.  HTN-BP slowly creeping up-increase Lopressor to 50 mg twice daily. Continue to hold Avapro and chlorthalidone for now.    GERD: Continue PPI  Depression: Continue Effexor  Asthma: Stable-not  wheezing-continue bronchodilators.  History of hepatitis C-treated per patient.  Obesity: Estimated body mass index is 36.56 kg/m as calculated from the following:   Height as of this encounter: 5\' 4"  (1.626 m).   Weight as of this encounter: 96.6 kg.      GI prophylaxis: PPI  ABG: No results found for: PHART, PCO2ART, PO2ART, HCO3, TCO2, ACIDBASEDEF, O2SAT  Vent Settings: N/A    Condition - Guarded  Family Communication  : [phone] -on 2/9  Code Status :  Full Code  Diet :  Diet Order            Diet regular Room service appropriate? Yes; Fluid consistency: Thin  Diet effective now                  Disposition Plan  :   Status is: Observation  The patient will require care spanning > 2 midnights and should be moved to inpatient because: Inpatient level of care appropriate due to severity of illness  Dispo: The patient is from: Home              Anticipated d/c is to: Home              Anticipated d/c date is: > 3 days              Patient currently is not medically stable to d/c.   Difficult to place patient No     Barriers to discharge: Ongoing sepsis physiology with pneumonia and pneumococcal bacteremia-not stable for discharge as patient remains on IV antimicrobial therapy.  Antimicorbials  :    Anti-infectives (From admission, onward)   Start     Dose/Rate Route Frequency Ordered Stop   05/31/20 1000  remdesivir 100 mg in sodium chloride 0.9 % 100 mL IVPB  Status:  Discontinued       "Followed by" Linked Group Details   100 mg 200 mL/hr over 30 Minutes Intravenous Daily 05/30/20 0931 05/30/20 1005   05/30/20 2200  nirmatrelvir/ritonavir EUA (PAXLOVID) TABS 2 tablet  Status:  Discontinued        2 tablet Oral 2 times daily 05/30/20 1316 05/30/20 1323   05/30/20 1400  nirmatrelvir/ritonavir EUA (PAXLOVID) TABS 2 tablet        2 tablet Oral 2 times daily 05/30/20 1323 06/01/20  1035   05/30/20 1300  nirmatrelvir/ritonavir EUA  (PAXLOVID) TABS 2 tablet  Status:  Discontinued        2 tablet Oral 2 times daily 05/30/20 1208 05/30/20 1316   05/30/20 1030  remdesivir 200 mg in sodium chloride 0.9% 250 mL IVPB  Status:  Discontinued       "Followed by" Linked Group Details   200 mg 580 mL/hr over 30 Minutes Intravenous Once 05/30/20 0931 05/30/20 1005   05/30/20 0815  cefTRIAXone (ROCEPHIN) 2 g in sodium chloride 0.9 % 100 mL IVPB        2 g 200 mL/hr over 30 Minutes Intravenous Every 24 hours 05/30/20 0715        Inpatient Medications  Scheduled Meds: . acetaminophen  1,000 mg Oral Q8H  . benzonatate  200 mg Oral TID  . enoxaparin (LOVENOX) injection  40 mg Subcutaneous Q24H  . furosemide  40 mg Intravenous Once  . gabapentin  100 mg Oral BID  . guaiFENesin  600 mg Oral BID  . levalbuterol  2 puff Inhalation Q8H  . metoprolol tartrate  25 mg Oral BID  . pantoprazole  40 mg Oral BID  . sodium chloride flush  3 mL Intravenous Q12H  . venlafaxine XR  37.5 mg Oral Daily   Continuous Infusions: . sodium chloride    . cefTRIAXone (ROCEPHIN)  IV 2 g (06/02/20 0750)   PRN Meds:.sodium chloride, albuterol, alum & mag hydroxide-simeth, chlorpheniramine-HYDROcodone, ondansetron **OR** ondansetron (ZOFRAN) IV, oxyCODONE, sodium chloride flush   Time Spent in minutes  25    See all Orders from today for further details   Oren Binet M.D on 06/02/2020 at 2:17 PM  To page go to [email or web address] - use universal password  [facility] -  Office  [phone]    Objective:   Vitals:   05/29/20 1020 06/02/20 0532 06/02/20 0744 06/02/20 1123  BP:  (!) 157/91 (!) 142/92 126/84  Pulse:  (!) 105 (!) 103 94  Resp:  20 20 20   Temp:  99.6 F (37.6 C) 98.3 F (36.8 C) 98.2 F (36.8 C)  TempSrc:  Oral Oral Oral  SpO2:  93% 93% 94%  Weight: 96.6 kg     Height: 5\' 4"  (1.626 m)       Wt Readings from Last 3 Encounters:  05/29/20 96.6 kg  06/04/17 95.8 kg  03/15/16 91.3 kg     Intake/Output Summary  (Last 24 hours) at 06/02/2020 1417 Last data filed at 06/02/2020 0901 Gross per 24 hour  Intake 340 ml  Output --  Net 340 ml     Physical Exam Gen Exam:Alert awake-not in any distress HEENT:atraumatic, normocephalic Chest: B/L clear to auscultation anteriorly CVS:S1S2 regular Abdomen:soft non tender, non distended Extremities:no edema Neurology: Non focal Skin: no rash   Data Review:    CBC Recent Labs  Lab 05/29/20 1101 05/30/20 0310 05/31/20 0456 06/01/20 0215 06/02/20 0053  WBC 8.6 7.0 8.7 13.7* 16.2*  HGB 11.9* 11.0* 10.8* 10.4* 10.9*  HCT 35.4* 34.2* 31.8* 30.8* 34.1*  PLT 176 172 113* 269 329  MCV 86.3 87.9 86.4 86.0 87.4  MCH 29.0 28.3 29.3 29.1 27.9  MCHC 33.6 32.2 34.0 33.8 32.0  RDW 14.0 13.9 14.3 14.5 14.6  LYMPHSABS 0.5* 1.0 1.5 1.4  --   MONOABS 0.2 0.2 0.3 1.0  --   EOSABS 0.0 0.0 0.1 0.1  --   BASOSABS 0.0 0.0 0.1 0.1  --     Chemistries  Recent Labs  Lab 05/29/20 2019 05/30/20 0310 05/31/20 0456 06/01/20 0215 06/02/20 0053  NA 135 135 133* 136 138  K 3.1* 2.8* 3.6 3.4* 3.4*  CL 97* 99 101 98 98  CO2 23 20* 19* 26 28  GLUCOSE 132* 139* 90 110* 107*  BUN 33* 31* 28* 19 13  CREATININE 2.05* 1.70* 1.24* 1.02* 0.79  CALCIUM 7.8* 7.9* 8.1* 8.7* 9.0  MG 1.7 1.9 1.5* 2.6* 2.0  AST 22 23 26 22 25   ALT 21 20 21 23 22   ALKPHOS 53 61 65 70 83  BILITOT 1.1 0.9 0.5 0.6 0.7   ------------------------------------------------------------------------------------------------------------------ No results for input(s): CHOL, HDL, LDLCALC, TRIG, CHOLHDL, LDLDIRECT in the last 72 hours.  No results found for: HGBA1C ------------------------------------------------------------------------------------------------------------------ No results for input(s): TSH, T4TOTAL, T3FREE, THYROIDAB in the last 72 hours.  Invalid input(s): FREET3 ------------------------------------------------------------------------------------------------------------------ No  results for input(s): VITAMINB12, FOLATE, FERRITIN, TIBC, IRON, RETICCTPCT in the last 72 hours.  Coagulation profile No results for input(s): INR, PROTIME in the last 168 hours.  Recent Labs    06/01/20 0215 06/02/20 0053  DDIMER 1.67* 2.17*    Cardiac Enzymes No results for input(s): CKMB, TROPONINI, MYOGLOBIN in the last 168 hours.  Invalid input(s): CK ------------------------------------------------------------------------------------------------------------------    Component Value Date/Time   BNP 179.3 (H) 06/01/2020 0803    Micro Results Recent Results (from the past 240 hour(s))  Blood Culture (routine x 2)     Status: Abnormal   Collection Time: 05/29/20 12:41 PM   Specimen: BLOOD  Result Value Ref Range Status   Specimen Description   Final    BLOOD RIGHT ANTECUBITAL Performed at [facility], [address] [place], [place] [address]    Special Requests   Final    BOTTLES DRAWN AEROBIC AND ANAEROBIC Blood Culture adequate volume Performed at [facility], [address] [place], [place] [address]    Culture  Setup Time   Final    GRAM POSITIVE COCCI IN PAIRS IN BOTH AEROBIC AND ANAEROBIC BOTTLES CRITICAL RESULT CALLED TO, READ BACK BY AND VERIFIED WITH: CATHY PIERCE PHARMD @0826  05/30/20 EB Performed at [facility], [place] [address] [place], [place] [address]    Culture STREPTOCOCCUS PNEUMONIAE (A)  Final   Report Status 06/01/2020 FINAL  Final   Organism ID, Bacteria STREPTOCOCCUS PNEUMONIAE  Final      Susceptibility   Streptococcus pneumoniae - MIC*    ERYTHROMYCIN RESISTANT Resistant     LEVOFLOXACIN 0.5 SENSITIVE Sensitive     VANCOMYCIN <=0.12 SENSITIVE Sensitive     PENICILLIN (meningitis) <=0.06 SENSITIVE Sensitive     PENO - penicillin <=0.06      PENICILLIN (non-meningitis) <=0.06 SENSITIVE Sensitive     PENICILLIN (oral) <=0.06 SENSITIVE Sensitive     CEFTRIAXONE (non-meningitis) <=0.12 SENSITIVE Sensitive      CEFTRIAXONE (meningitis) <=0.12 SENSITIVE Sensitive     * STREPTOCOCCUS PNEUMONIAE  Blood Culture ID Panel (Reflexed)     Status: Abnormal   Collection Time: 05/29/20 12:41 PM  Result Value Ref Range Status   Enterococcus faecalis NOT DETECTED NOT DETECTED Final   Enterococcus Faecium NOT DETECTED NOT DETECTED Final   Listeria monocytogenes NOT DETECTED NOT DETECTED Final   Staphylococcus species NOT DETECTED NOT DETECTED Final   Staphylococcus aureus (BCID) NOT DETECTED NOT DETECTED Final   Staphylococcus epidermidis NOT DETECTED NOT DETECTED Final   Staphylococcus lugdunensis NOT DETECTED NOT DETECTED Final   Streptococcus species DETECTED (A) NOT DETECTED Final  Comment: CRITICAL RESULT CALLED TO, READ BACK BY AND VERIFIED WITH: CATHY PIERCE PHARMD @0826  05/30/20 EB    Streptococcus agalactiae NOT DETECTED NOT DETECTED Final   Streptococcus pneumoniae DETECTED (A) NOT DETECTED Final    Comment: CRITICAL RESULT CALLED TO, READ BACK BY AND VERIFIED WITH: CATHY PIERCE PHARMD @0826  05/30/20 EB    Streptococcus pyogenes NOT DETECTED NOT DETECTED Final   A.calcoaceticus-baumannii NOT DETECTED NOT DETECTED Final   Bacteroides fragilis NOT DETECTED NOT DETECTED Final   Enterobacterales NOT DETECTED NOT DETECTED Final   Enterobacter cloacae complex NOT DETECTED NOT DETECTED Final   Escherichia coli NOT DETECTED NOT DETECTED Final   Klebsiella aerogenes NOT DETECTED NOT DETECTED Final   Klebsiella oxytoca NOT DETECTED NOT DETECTED Final   Klebsiella pneumoniae NOT DETECTED NOT DETECTED Final   Proteus species NOT DETECTED NOT DETECTED Final   Salmonella species NOT DETECTED NOT DETECTED Final   Serratia marcescens NOT DETECTED NOT DETECTED Final   Haemophilus influenzae NOT DETECTED NOT DETECTED Final   Neisseria meningitidis NOT DETECTED NOT DETECTED Final   Pseudomonas aeruginosa NOT DETECTED NOT DETECTED Final   Stenotrophomonas maltophilia NOT DETECTED NOT DETECTED Final   Candida  albicans NOT DETECTED NOT DETECTED Final   Candida auris NOT DETECTED NOT DETECTED Final   Candida glabrata NOT DETECTED NOT DETECTED Final   Candida krusei NOT DETECTED NOT DETECTED Final   Candida parapsilosis NOT DETECTED NOT DETECTED Final   Candida tropicalis NOT DETECTED NOT DETECTED Final   Cryptococcus neoformans/gattii NOT DETECTED NOT DETECTED Final    Comment: Performed at [facility], [address], [place], [place] [address]  Blood Culture (routine x 2)     Status: Abnormal   Collection Time: 05/29/20 12:47 PM   Specimen: BLOOD  Result Value Ref Range Status   Specimen Description   Final    BLOOD LEFT ANTECUBITAL Performed at [facility], [address] [place], [place] [address]    Special Requests   Final    BOTTLES DRAWN AEROBIC AND ANAEROBIC Blood Culture adequate volume Performed at [facility], [address] [place], [place] [address]    Culture  Setup Time   Final    GRAM POSITIVE COCCI AEROBIC BOTTLE ONLY CRITICAL VALUE NOTED.  VALUE IS CONSISTENT WITH PREVIOUSLY REPORTED AND CALLED VALUE.    Culture (A)  Final    STREPTOCOCCUS PNEUMONIAE SUSCEPTIBILITIES PERFORMED ON PREVIOUS CULTURE WITHIN THE LAST 5 DAYS. Performed at [facility], [place] [address] [place], [place] [address]    Report Status 06/02/2020 FINAL  Final  SARS Coronavirus 2 by RT PCR (hospital order, performed in [facility] hospital lab) Nasopharyngeal Nasopharyngeal Swab     Status: Abnormal   Collection Time: 05/29/20 12:52 PM   Specimen: Nasopharyngeal Swab  Result Value Ref Range Status   SARS Coronavirus 2 POSITIVE (A) NEGATIVE Final    Comment: RESULT CALLED TO, READ BACK BY AND VERIFIED WITH:  [email or web address],RN @ 1413 ON 05/29/2020, [email or web address] (NOTE) SARS-CoV-2 target nucleic acids are DETECTED  SARS-CoV-2 RNA is generally detectable in upper respiratory specimens  during the acute phase of infection.  Positive results are indicative  of the  presence of the identified virus, but do not rule out bacterial infection or co-infection with other pathogens not detected by the test.  Clinical correlation with patient history and  other diagnostic information is necessary to determine patient infection status.  The expected result is negative.  Fact Sheet  for Patients:   StrictlyIdeas.no   Fact Sheet for Healthcare Providers:   BankingDealers.co.za    This test is not yet approved or cleared by the [place] FDA and  has been authorized for detection and/or diagnosis of SARS-CoV-2 by FDA under an Emergency Use Authorization (EUA).  This EUA will remain in effect (mea ning this test can be used) for the duration of  the COVID-19 declaration under Section 564(b)(1) of the Act, 21 U.S.C. section 360-bbb-3(b)(1), unless the authorization is terminated or revoked sooner.  Performed at [facility], [address] [place], [place] [address]   Culture, blood (routine x 2)     Status: None (Preliminary result)   Collection Time: 06/01/20  7:46 AM   Specimen: BLOOD  Result Value Ref Range Status   Specimen Description BLOOD RIGHT ANTECUBITAL  Final   Special Requests   Final    BOTTLES DRAWN AEROBIC AND ANAEROBIC Blood Culture results may not be optimal due to an inadequate volume of blood received in culture bottles   Culture   Final    NO GROWTH < 24 HOURS Performed at [facility], [place] [address] [place], [place] [address]    Report Status PENDING  Incomplete  Culture, blood (routine x 2)     Status: None (Preliminary result)   Collection Time: 06/01/20  7:58 AM   Specimen: BLOOD RIGHT FOREARM  Result Value Ref Range Status   Specimen Description BLOOD RIGHT FOREARM  Final   Special Requests   Final    BOTTLES DRAWN AEROBIC ONLY Blood Culture results may not be optimal due to an inadequate volume of blood received in culture bottles   Culture   Final    NO  GROWTH < 24 HOURS Performed at [facility], [place] [address] [place], [place] [address]    Report Status PENDING  Incomplete    Radiology Reports CT ANGIO CHEST PE W OR WO CONTRAST  Result Date: 06/01/2020 CLINICAL DATA:  COVID pneumonia, left lower lobe consolidation, dyspnea EXAM: CT ANGIOGRAPHY CHEST WITH CONTRAST TECHNIQUE: Multidetector CT imaging of the chest was performed using the standard protocol during bolus administration of intravenous contrast. Multiplanar CT image reconstructions and MIPs were obtained to evaluate the vascular anatomy. CONTRAST:  53mL OMNIPAQUE IOHEXOL 350 MG/ML SOLN COMPARISON:  CT coronary calcium 11/11/2015 FINDINGS: Cardiovascular: Satisfactory opacification of the pulmonary arteries to the segmental level. No evidence of pulmonary embolism. Normal heart size. No pericardial effusion. The central pulmonary arteries are of normal caliber. The thoracic aorta is unremarkable. Mediastinum/Nodes: No enlarged mediastinal, hilar, or axillary lymph nodes. Thyroid gland, trachea, and esophagus demonstrate no significant findings. Lungs/Pleura: There is dense left lower lobe consolidation in keeping with changes of acute lobar pneumonia in the appropriate clinical setting. There is endobronchial debris resulting in impaction of a the bronchi of the left lower lobe. Mild scattered infiltrate is seen within the basilar lingula and right upper lobe, likely infectious or inflammatory and potentially related to endobronchial spread of disease. A small amount of debris is noted within the right lower lobe bronchus. Small left pleural effusion is present. No pneumothorax. Upper Abdomen: At least moderate hepatic steatosis is noted. Musculoskeletal: No acute bone abnormality. Review of the MIP images confirms the above findings. IMPRESSION: Dense consolidation and airway impaction involving the left lower lobe compatible with changes of acute lobar pneumonia in the appropriate  clinical setting. Small parapneumonic effusion noted. Scattered infiltrate within the right upper lobe  and basilar lingula as well as endobronchial debris within the left lower lobe possibly representing endobronchial spread of disease. Aspiration could also appear in this fashion, though its asymmetry involving the left lower lobe makes this less likely. No pulmonary embolism Electronically Signed   By: Fidela Salisbury MD   On: 06/01/2020 11:34   DG Chest Portable 1 View  Result Date: 05/29/2020 CLINICAL DATA:  COVID positive.  Shortness of breath.  Weakness. EXAM: PORTABLE CHEST 1 VIEW COMPARISON:  Two-view chest x-ray 01/13/2015 FINDINGS: Heart size is normal. Hilar fullness present bilaterally. Asymmetric left lower lobe airspace opacities are present. Upper lung fields are clear. IMPRESSION: 1. Asymmetric left lower lobe airspace disease compatible with pneumonia. 2. Bilateral hilar fullness. Underlying adenopathy is not excluded. Electronically Signed   By: San Morelle M.D.   On: 05/29/2020 11:35   DG Chest Port 1V same Day  Result Date: 05/31/2020 CLINICAL DATA:  COVID, shortness of breath EXAM: PORTABLE CHEST 1 VIEW COMPARISON:  05/29/2020 FINDINGS: Heart is normal size. Airspace opacity noted in the left lower lobe. Small left pleural effusion. No confluent opacity or effusion on the right. No acute bony abnormality. IMPRESSION: Left lower lobe pneumonia.  Small left effusion. Electronically Signed   By: Rolm Baptise M.D.   On: 05/31/2020 20:23   VAS [place] LOWER EXTREMITY VENOUS (DVT)  Result Date: 05/30/2020  Lower Venous DVT Study Indications: Covid.  Risk Factors: Surgery Patient reports partial LT knee replacement in January. Anticoagulation: Lovenox. Comparison       11-05-2015 Bilateral lower extremity venous was negative for Study:           DVT. Performing Technologist: Darlin Coco RDMS  Examination Guidelines: A complete evaluation includes B-mode imaging, spectral Doppler, color  Doppler, and power Doppler as needed of all accessible portions of each vessel. Bilateral testing is considered an integral part of a complete examination. Limited examinations for reoccurring indications may be performed as noted. The reflux portion of the exam is performed with the patient in reverse Trendelenburg.  +---------+---------------+---------+-----------+----------+--------------+ RIGHT    CompressibilityPhasicitySpontaneityPropertiesThrombus Aging +---------+---------------+---------+-----------+----------+--------------+ CFV      Full           Yes      Yes                                 +---------+---------------+---------+-----------+----------+--------------+ SFJ      Full                                                        +---------+---------------+---------+-----------+----------+--------------+ FV Prox  Full                                                        +---------+---------------+---------+-----------+----------+--------------+ FV Mid   Full                                                        +---------+---------------+---------+-----------+----------+--------------+ FV  DistalFull                                                        +---------+---------------+---------+-----------+----------+--------------+ PFV      Full                                                        +---------+---------------+---------+-----------+----------+--------------+ POP      Full           Yes      Yes                                 +---------+---------------+---------+-----------+----------+--------------+ PTV      Full                                                        +---------+---------------+---------+-----------+----------+--------------+ PERO     Full                                                        +---------+---------------+---------+-----------+----------+--------------+    +---------+---------------+---------+-----------+----------+--------------+ LEFT     CompressibilityPhasicitySpontaneityPropertiesThrombus Aging +---------+---------------+---------+-----------+----------+--------------+ CFV      Full           Yes      Yes                                 +---------+---------------+---------+-----------+----------+--------------+ SFJ      Full                                                        +---------+---------------+---------+-----------+----------+--------------+ FV Prox  Full                                                        +---------+---------------+---------+-----------+----------+--------------+ FV Mid   Full                                                        +---------+---------------+---------+-----------+----------+--------------+ FV DistalFull                                                        +---------+---------------+---------+-----------+----------+--------------+  PFV      Full                                                        +---------+---------------+---------+-----------+----------+--------------+ POP      Full           Yes      Yes                                 +---------+---------------+---------+-----------+----------+--------------+ PTV      Full                                                        +---------+---------------+---------+-----------+----------+--------------+ PERO     Full                                                        +---------+---------------+---------+-----------+----------+--------------+    Summary: RIGHT: - There is no evidence of deep vein thrombosis in the lower extremity.  - No cystic structure found in the popliteal fossa.  LEFT: - There is no evidence of deep vein thrombosis in the lower extremity.  - No cystic structure found in the popliteal fossa.  *See table(s) above for measurements and observations. Electronically signed  by [name] Martinez MD on 05/30/2020 at 2:31:02 PM.    Final    ECHOCARDIOGRAM LIMITED  Result Date: 06/01/2020    ECHOCARDIOGRAM LIMITED REPORT   Patient Name:   [name] Date of Exam: 06/01/2020 Medical Rec #:  [id number]      Height:       64.0 in Accession #:    [id number]     Weight:       213.0 lb Date of Birth:  03-21-1956      BSA:          2.009 m Patient Age:    18 years       BP:           132/85 mmHg Patient Gender: F              HR:           100 bpm. Exam Location:  Inpatient Procedure: Limited Echo, Limited Color Doppler, Cardiac Doppler and Intracardiac            Opacification Agent Indications:    Bacteremia R78.81  History:        Patient has prior history of Echocardiogram examinations, most                 recent 11/11/2015. Signs/Symptoms:Shortness of Breath and Fever;                 Risk Factors:Hypertension. COVID 19 pneumonia. Hepatitis C.                 Acute kidney injury.  Sonographer:    Darlina Sicilian RDCS Referring Phys: [place]  1. Left ventricular ejection fraction,  by estimation, is 60 to 65%. The left ventricle has normal function. The left ventricle has no regional wall motion abnormalities. Left ventricular diastolic parameters are consistent with Grade I diastolic dysfunction (impaired relaxation). Elevated left atrial pressure.  2. Right ventricular systolic function is normal. The right ventricular size is normal.  3. The mitral valve is normal in structure. No evidence of mitral valve regurgitation. No evidence of mitral stenosis.  4. The aortic valve is tricuspid. Aortic valve regurgitation is not visualized. Mild aortic valve sclerosis is present, with no evidence of aortic valve stenosis. FINDINGS  Left Ventricle: Left ventricular ejection fraction, by estimation, is 60 to 65%. The left ventricle has normal function. The left ventricle has no regional wall motion abnormalities. Definity contrast agent was given IV to delineate the left  ventricular  endocardial borders. The left ventricular internal cavity size was normal in size. There is no left ventricular hypertrophy. Left ventricular diastolic parameters are consistent with Grade I diastolic dysfunction (impaired relaxation). Elevated left atrial pressure. Right Ventricle: The right ventricular size is normal.Right ventricular systolic function is normal. Left Atrium: Left atrial size was normal in size. Right Atrium: Right atrial size was normal in size. Pericardium: There is no evidence of pericardial effusion. Mitral Valve: The mitral valve is normal in structure. No evidence of mitral valve stenosis. Tricuspid Valve: The tricuspid valve is normal in structure. Tricuspid valve regurgitation is trivial. No evidence of tricuspid stenosis. Aortic Valve: The aortic valve is tricuspid. Aortic valve regurgitation is not visualized. Mild aortic valve sclerosis is present, with no evidence of aortic valve stenosis. Pulmonic Valve: The pulmonic valve was not well visualized. Pulmonic valve regurgitation is not visualized. No evidence of pulmonic stenosis. Aorta: The aortic root is normal in size and structure.  LEFT VENTRICLE PLAX 2D LVIDd:         4.25 cm     Diastology LVIDs:         3.10 cm     LV e' medial:    7.07 cm/s LV PW:         0.80 cm     LV E/e' medial:  17.3 LV IVS:        0.95 cm     LV e' lateral:   7.07 cm/s                            LV E/e' lateral: 17.3  LV Volumes (MOD) LV vol d, MOD A2C: 66.8 ml LV vol d, MOD A4C: 77.7 ml LV vol s, MOD A2C: 29.6 ml LV vol s, MOD A4C: 35.3 ml LV SV MOD A2C:     37.2 ml LV SV MOD A4C:     77.7 ml LV SV MOD BP:      38.9 ml LEFT ATRIUM         Index LA diam:    2.90 cm 1.44 cm/m  AORTIC VALVE LVOT Vmax:   140.00 cm/s LVOT Vmean:  110.000 cm/s LVOT VTI:    0.201 m MITRAL VALVE MV Area (PHT): 3.72 cm     SHUNTS MV Decel Time: 204 msec     Systemic VTI: 0.20 m MV E velocity: 122.00 cm/s MV A velocity: 125.00 cm/s MV E/A ratio:  0.98 Kirk Ruths  MD Electronically signed by Kirk Ruths MD Signature Date/Time: 06/01/2020/12:17:19 PM    Final

## 2020-06-03 DIAGNOSIS — U071 COVID-19: Secondary | ICD-10-CM | POA: Diagnosis not present

## 2020-06-03 DIAGNOSIS — I10 Essential (primary) hypertension: Secondary | ICD-10-CM | POA: Diagnosis not present

## 2020-06-03 DIAGNOSIS — N179 Acute kidney failure, unspecified: Secondary | ICD-10-CM | POA: Diagnosis not present

## 2020-06-03 LAB — CBC
HCT: 33 % — ABNORMAL LOW (ref 36.0–46.0)
Hemoglobin: 11.1 g/dL — ABNORMAL LOW (ref 12.0–15.0)
MCH: 28.8 pg (ref 26.0–34.0)
MCHC: 33.6 g/dL (ref 30.0–36.0)
MCV: 85.5 fL (ref 80.0–100.0)
Platelets: 393 10*3/uL (ref 150–400)
RBC: 3.86 MIL/uL — ABNORMAL LOW (ref 3.87–5.11)
RDW: 14.7 % (ref 11.5–15.5)
WBC: 16.2 10*3/uL — ABNORMAL HIGH (ref 4.0–10.5)
nRBC: 0 % (ref 0.0–0.2)

## 2020-06-03 LAB — COMPREHENSIVE METABOLIC PANEL
ALT: 20 U/L (ref 0–44)
AST: 19 U/L (ref 15–41)
Albumin: 2.5 g/dL — ABNORMAL LOW (ref 3.5–5.0)
Alkaline Phosphatase: 77 U/L (ref 38–126)
Anion gap: 16 — ABNORMAL HIGH (ref 5–15)
BUN: 15 mg/dL (ref 8–23)
CO2: 30 mmol/L (ref 22–32)
Calcium: 9.3 mg/dL (ref 8.9–10.3)
Chloride: 94 mmol/L — ABNORMAL LOW (ref 98–111)
Creatinine, Ser: 0.88 mg/dL (ref 0.44–1.00)
GFR, Estimated: >60 mL/min (ref >60)
Glucose, Bld: 89 mg/dL (ref 70–99)
Potassium: 3 mmol/L — ABNORMAL LOW (ref 3.5–5.1)
Sodium: 140 mmol/L (ref 135–145)
Total Bilirubin: 0.7 mg/dL (ref 0.3–1.2)
Total Protein: 6.9 g/dL (ref 6.5–8.1)

## 2020-06-03 LAB — MAGNESIUM: Magnesium: 1.9 mg/dL (ref 1.7–2.4)

## 2020-06-03 LAB — C-REACTIVE PROTEIN: CRP: 30.8 mg/dL — ABNORMAL HIGH (ref <1.0)

## 2020-06-03 LAB — D-DIMER, QUANTITATIVE: D-Dimer, Quant: 2.35 ug/mL-FEU — ABNORMAL HIGH (ref 0.00–0.50)

## 2020-06-03 MED ORDER — FUROSEMIDE 10 MG/ML IJ SOLN
40.0000 mg | Freq: Once | INTRAMUSCULAR | Status: AC
  Administered 2020-06-03: 40 mg via INTRAVENOUS
  Filled 2020-06-03: qty 4

## 2020-06-03 MED ORDER — MAGNESIUM SULFATE 2 GM/50ML IV SOLN
2.0000 g | Freq: Once | INTRAVENOUS | Status: AC
  Administered 2020-06-03: 2 g via INTRAVENOUS
  Filled 2020-06-03: qty 50

## 2020-06-03 MED ORDER — POTASSIUM CHLORIDE CRYS ER 20 MEQ PO TBCR
40.0000 meq | EXTENDED_RELEASE_TABLET | Freq: Four times a day (QID) | ORAL | Status: AC
  Administered 2020-06-03 (×2): 40 meq via ORAL
  Filled 2020-06-03 (×2): qty 2

## 2020-06-03 NOTE — Plan of Care (Signed)
  Problem: Clinical Measurements: Goal: Will remain free from infection Outcome: Progressing Goal: Diagnostic test results will improve Outcome: Progressing Goal: Respiratory complications will improve Outcome: Progressing   Problem: Respiratory: Goal: Will maintain a patent airway Outcome: Progressing

## 2020-06-03 NOTE — Progress Notes (Signed)
PROGRESS NOTE                                                                                                                                                                                                             Patient Demographics:    [name], is a 65 y.o. female, DOB - 1956-01-23, BCW:[id number]  Outpatient Primary MD for the patient is Lujean Amel, MD   Admit date - 05/29/2020   LOS - 4  Chief Complaint  Patient presents with  . Shortness of Breath       Brief Narrative: Patient is a 65 y.o. female with PMHx of HTN, hepatitis C, asthma, depression-who tested positive for COVID-19 on 1/30 (Home test)-after multiple family exposures-presented to the hospital with fevers, chills, cough, diarrhea and shortness of breath-she was found to have AKI, left-sided infiltrate-subsequent blood cultures positive for pneumococcus.  See below for further details.   COVID-19 vaccinated status: Unvaccinated  Significant Events: 2/5>> Admit to [facility] for AKI, PNA-blood cultures positive for pneumococcus 2/8>> left-sided pleuritic chest pain/2 L oxygen requirement/tachycardic  Significant studies: 2/5>>Chest x-ray: Left lower pneumonia, bilateral hilar fullness 2/6>> bilateral lower extremity Doppler: No DVT 2/8>> CTA chest: No PE, dense LLE-small parapneumonic effusion 2/8>>Echo: EF 60-65%, no wall motion abnormalities, grade 1 diastolic dysfunction  [id number] medications: Paxlovid:2/2>>2/8  Antibiotics: Rocephin: 2/6>>  Microbiology data: 2/5 >>blood culture: Streptococcus pneumonae 2/8>> blood culture: No growth  Procedures: None  Consults: None  DVT prophylaxis: enoxaparin (LOVENOX) injection 40 mg Start: 05/29/20 2000    Subjective:   Continues to have left-sided pleuritic chest pain.    Assessment  & Plan :   Sepsis due to lobar pneumonia and pneumococcal bacteremia: Sepsis physiology has  resolved-tachycardia better-however leukocytosis slowly uptrending.  Repeat blood cultures negative.  Echo without any obvious valvular abnormalities.  Continue Rocephin-watch for another day-if clinical improvement continues-potential discharge in the next day or so on oral antimicrobial therapy.  Left-sided pleuritic chest pain: Likely due to LL PNA-no PE on CTA chest.  Could also possibly be from musculoskeletal strain from coughing.  Supportive care.   AKI: Hemodynamically mediated due to sepsis/bacteremia-resolved.  Lower extremity edema: Probably due to hypoalbuminemia and diastolic CHF.  Repeat IV Lasix today-reassess tomorrow.    Hypomagnesemia: Replete and recheck.  Hypokalemia: Replete and recheck.  COVID-19 infection: Suspect that main issues  are from pneumococcal bacteremia/PNA-has finished a course of Paxlovid.  CRP significantly elevated-but this is from pneumococcal pneumonia/bacteremia and is slowly downtrending.  She remains stable on room air-if hypoxemia worsens-and if felt to be due to COVID-19 infection-we will initiate steroids and Remdesivir.  O2 requirements:  SpO2: 92 % O2 Flow Rate (L/min): (S) 2 L/min  COVID-19 Labs: Recent Labs    06/01/20 0215 06/02/20 0053 06/03/20 0106  DDIMER 1.67* 2.17* 2.35*  CRP 33.1* 31.9* 30.8*      Component Value Date/Time   BNP 179.3 (H) 06/01/2020 0803    Recent Labs  Lab 05/29/20 1232 05/31/20 0857 06/01/20 0215  PROCALCITON 23.17 7.70 5.15    Lab Results  Component Value Date   SARSCOV2NAA POSITIVE (A) 05/29/2020   [place] Not Detected 07/28/2019   [place] Not Detected 03/24/2019   SARSCOV2NAA Detected (A) 03/07/2019    Elevated D-dimer: Mild-Doppler/CTA chest negative.  On prophylactic Lovenox.  HTN-BP better controlled-continue Lopressor-getting as needed dosing of Lasix.  Continue to hold Avapro and chlorthalidone.    GERD: Continue PPI  Depression: Continue Effexor  Asthma: Stable-not  wheezing-continue bronchodilators.  History of hepatitis C-treated per patient.  Obesity: Estimated body mass index is 36.56 kg/m as calculated from the following:   Height as of this encounter: 5\' 4"  (1.626 m).   Weight as of this encounter: 96.6 kg.      GI prophylaxis: PPI  ABG: No results found for: PHART, PCO2ART, PO2ART, HCO3, TCO2, ACIDBASEDEF, O2SAT  Vent Settings: N/A    Condition - Guarded  Family Communication  : Daughter-[phone] -on 2/10  Code Status :  Full Code  Diet :  Diet Order            Diet regular Room service appropriate? Yes; Fluid consistency: Thin  Diet effective now                  Disposition Plan  :   Status is: Observation  The patient will require care spanning > 2 midnights and should be moved to inpatient because: Inpatient level of care appropriate due to severity of illness  Dispo: The patient is from: Home              Anticipated d/c is to: Home              Anticipated d/c date is: > 3 days              Patient currently is not medically stable to d/c.   Difficult to place patient No    Barriers to discharge: Ongoing sepsis physiology with pneumonia and pneumococcal bacteremia-not stable for discharge as patient remains on IV antimicrobial therapy.  Antimicorbials  :    Anti-infectives (From admission, onward)   Start     Dose/Rate Route Frequency Ordered Stop   05/31/20 1000  remdesivir 100 mg in sodium chloride 0.9 % 100 mL IVPB  Status:  Discontinued       "Followed by" Linked Group Details   100 mg 200 mL/hr over 30 Minutes Intravenous Daily 05/30/20 0931 05/30/20 1005   05/30/20 2200  nirmatrelvir/ritonavir EUA (PAXLOVID) TABS 2 tablet  Status:  Discontinued        2 tablet Oral 2 times daily 05/30/20 1316 05/30/20 1323   05/30/20 1400  nirmatrelvir/ritonavir EUA (PAXLOVID) TABS 2 tablet        2 tablet Oral 2 times daily 05/30/20 1323 06/01/20 1035   05/30/20 1300  nirmatrelvir/ritonavir EUA  (PAXLOVID) TABS 2  tablet  Status:  Discontinued        2 tablet Oral 2 times daily 05/30/20 1208 05/30/20 1316   05/30/20 1030  remdesivir 200 mg in sodium chloride 0.9% 250 mL IVPB  Status:  Discontinued       "Followed by" Linked Group Details   200 mg 580 mL/hr over 30 Minutes Intravenous Once 05/30/20 0931 05/30/20 1005   05/30/20 0815  cefTRIAXone (ROCEPHIN) 2 g in sodium chloride 0.9 % 100 mL IVPB        2 g 200 mL/hr over 30 Minutes Intravenous Every 24 hours 05/30/20 0715        Inpatient Medications  Scheduled Meds: . acetaminophen  1,000 mg Oral Q8H  . benzonatate  200 mg Oral TID  . enoxaparin (LOVENOX) injection  40 mg Subcutaneous Q24H  . gabapentin  100 mg Oral BID  . guaiFENesin  600 mg Oral BID  . levalbuterol  2 puff Inhalation Q8H  . metoprolol tartrate  50 mg Oral BID  . pantoprazole  40 mg Oral BID  . sodium chloride flush  3 mL Intravenous Q12H  . venlafaxine XR  37.5 mg Oral Daily   Continuous Infusions: . sodium chloride    . cefTRIAXone (ROCEPHIN)  IV 2 g (06/03/20 0725)   PRN Meds:.sodium chloride, albuterol, alum & mag hydroxide-simeth, chlorpheniramine-HYDROcodone, ondansetron **OR** ondansetron (ZOFRAN) IV, oxyCODONE, sodium chloride flush   Time Spent in minutes  25    See all Orders from today for further details   Oren Binet M.D on 06/03/2020 at 2:00 PM  To page go to [email or web address] - use universal password  [facility] -  Office  [phone]    Objective:   Vitals:   06/02/20 2348 06/03/20 0421 06/03/20 0721 06/03/20 1150  BP: 116/80 (!) 130/91 (!) 142/68 (!) 119/94  Pulse: 86 100 100 98  Resp: 15 18 18 20   Temp: 98.2 F (36.8 C) 98.3 F (36.8 C) 97.7 F (36.5 C) 97.9 F (36.6 C)  TempSrc: Axillary Axillary Oral Oral  SpO2: 92% 91% 92% 92%  Weight:      Height:        Wt Readings from Last 3 Encounters:  05/29/20 96.6 kg  06/04/17 95.8 kg  03/15/16 91.3 kg     Intake/Output Summary (Last 24 hours) at  06/03/2020 1400 Last data filed at 06/03/2020 1333 Gross per 24 hour  Intake 690 ml  Output 450 ml  Net 240 ml     Physical Exam Gen Exam:Alert awake-not in any distress HEENT:atraumatic, normocephalic Chest: B/L clear to auscultation anteriorly CVS:S1S2 regular Abdomen:soft non tender, non distended Extremities:no edema Neurology: Non focal Skin: no rash   Data Review:    CBC Recent Labs  Lab 05/29/20 1101 05/30/20 0310 05/31/20 0456 06/01/20 0215 06/02/20 0053 06/03/20 0106  WBC 8.6 7.0 8.7 13.7* 16.2* 16.2*  HGB 11.9* 11.0* 10.8* 10.4* 10.9* 11.1*  HCT 35.4* 34.2* 31.8* 30.8* 34.1* 33.0*  PLT 176 172 113* 269 329 393  MCV 86.3 87.9 86.4 86.0 87.4 85.5  MCH 29.0 28.3 29.3 29.1 27.9 28.8  MCHC 33.6 32.2 34.0 33.8 32.0 33.6  RDW 14.0 13.9 14.3 14.5 14.6 14.7  LYMPHSABS 0.5* 1.0 1.5 1.4  --   --   MONOABS 0.2 0.2 0.3 1.0  --   --   EOSABS 0.0 0.0 0.1 0.1  --   --   BASOSABS 0.0 0.0 0.1 0.1  --   --     Chemistries  Recent  Labs  Lab 05/30/20 0310 05/31/20 0456 06/01/20 0215 06/02/20 0053 06/03/20 0106  NA 135 133* 136 138 140  K 2.8* 3.6 3.4* 3.4* 3.0*  CL 99 101 98 98 94*  CO2 20* 19* 26 28 30   GLUCOSE 139* 90 110* 107* 89  BUN 31* 28* 19 13 15   CREATININE 1.70* 1.24* 1.02* 0.79 0.88  CALCIUM 7.9* 8.1* 8.7* 9.0 9.3  MG 1.9 1.5* 2.6* 2.0 1.9  AST 23 26 22 25 19   ALT 20 21 23 22 20   ALKPHOS 61 65 70 83 77  BILITOT 0.9 0.5 0.6 0.7 0.7   ------------------------------------------------------------------------------------------------------------------ No results for input(s): CHOL, HDL, LDLCALC, TRIG, CHOLHDL, LDLDIRECT in the last 72 hours.  No results found for: HGBA1C ------------------------------------------------------------------------------------------------------------------ No results for input(s): TSH, T4TOTAL, T3FREE, THYROIDAB in the last 72 hours.  Invalid input(s):  FREET3 ------------------------------------------------------------------------------------------------------------------ No results for input(s): VITAMINB12, FOLATE, FERRITIN, TIBC, IRON, RETICCTPCT in the last 72 hours.  Coagulation profile No results for input(s): INR, PROTIME in the last 168 hours.  Recent Labs    06/02/20 0053 06/03/20 0106  DDIMER 2.17* 2.35*    Cardiac Enzymes No results for input(s): CKMB, TROPONINI, MYOGLOBIN in the last 168 hours.  Invalid input(s): CK ------------------------------------------------------------------------------------------------------------------    Component Value Date/Time   BNP 179.3 (H) 06/01/2020 0803    Micro Results Recent Results (from the past 240 hour(s))  Blood Culture (routine x 2)     Status: Abnormal   Collection Time: 05/29/20 12:41 PM   Specimen: BLOOD  Result Value Ref Range Status   Specimen Description   Final    BLOOD RIGHT ANTECUBITAL Performed at [facility], [address] [place], [place] [address]    Special Requests   Final    BOTTLES DRAWN AEROBIC AND ANAEROBIC Blood Culture adequate volume Performed at [facility], [address] [place], [place] [address]    Culture  Setup Time   Final    GRAM POSITIVE COCCI IN PAIRS IN BOTH AEROBIC AND ANAEROBIC BOTTLES CRITICAL RESULT CALLED TO, READ BACK BY AND VERIFIED WITH: CATHY PIERCE PHARMD @0826  05/30/20 EB Performed at [facility], [place] [address], [place], [place] [address]    Culture STREPTOCOCCUS PNEUMONIAE (A)  Final   Report Status 06/01/2020 FINAL  Final   Organism ID, Bacteria STREPTOCOCCUS PNEUMONIAE  Final      Susceptibility   Streptococcus pneumoniae - MIC*    ERYTHROMYCIN RESISTANT Resistant     LEVOFLOXACIN 0.5 SENSITIVE Sensitive     VANCOMYCIN <=0.12 SENSITIVE Sensitive     PENICILLIN (meningitis) <=0.06 SENSITIVE Sensitive     PENO - penicillin <=0.06      PENICILLIN (non-meningitis) <=0.06 SENSITIVE  Sensitive     PENICILLIN (oral) <=0.06 SENSITIVE Sensitive     CEFTRIAXONE (non-meningitis) <=0.12 SENSITIVE Sensitive     CEFTRIAXONE (meningitis) <=0.12 SENSITIVE Sensitive     * STREPTOCOCCUS PNEUMONIAE  Blood Culture ID Panel (Reflexed)     Status: Abnormal   Collection Time: 05/29/20 12:41 PM  Result Value Ref Range Status   Enterococcus faecalis NOT DETECTED NOT DETECTED Final   Enterococcus Faecium NOT DETECTED NOT DETECTED Final   Listeria monocytogenes NOT DETECTED NOT DETECTED Final   Staphylococcus species NOT DETECTED NOT DETECTED Final   Staphylococcus aureus (BCID) NOT DETECTED NOT DETECTED Final   Staphylococcus epidermidis NOT DETECTED NOT DETECTED Final   Staphylococcus lugdunensis NOT DETECTED NOT DETECTED Final   Streptococcus species DETECTED (A) NOT DETECTED  Final    Comment: CRITICAL RESULT CALLED TO, READ BACK BY AND VERIFIED WITH: CATHY PIERCE PHARMD @0826  05/30/20 EB    Streptococcus agalactiae NOT DETECTED NOT DETECTED Final   Streptococcus pneumoniae DETECTED (A) NOT DETECTED Final    Comment: CRITICAL RESULT CALLED TO, READ BACK BY AND VERIFIED WITH: CATHY PIERCE PHARMD @0826  05/30/20 EB    Streptococcus pyogenes NOT DETECTED NOT DETECTED Final   A.calcoaceticus-baumannii NOT DETECTED NOT DETECTED Final   Bacteroides fragilis NOT DETECTED NOT DETECTED Final   Enterobacterales NOT DETECTED NOT DETECTED Final   Enterobacter cloacae complex NOT DETECTED NOT DETECTED Final   Escherichia coli NOT DETECTED NOT DETECTED Final   Klebsiella aerogenes NOT DETECTED NOT DETECTED Final   Klebsiella oxytoca NOT DETECTED NOT DETECTED Final   Klebsiella pneumoniae NOT DETECTED NOT DETECTED Final   Proteus species NOT DETECTED NOT DETECTED Final   Salmonella species NOT DETECTED NOT DETECTED Final   Serratia marcescens NOT DETECTED NOT DETECTED Final   Haemophilus influenzae NOT DETECTED NOT DETECTED Final   Neisseria meningitidis NOT DETECTED NOT DETECTED Final    Pseudomonas aeruginosa NOT DETECTED NOT DETECTED Final   Stenotrophomonas maltophilia NOT DETECTED NOT DETECTED Final   Candida albicans NOT DETECTED NOT DETECTED Final   Candida auris NOT DETECTED NOT DETECTED Final   Candida glabrata NOT DETECTED NOT DETECTED Final   Candida krusei NOT DETECTED NOT DETECTED Final   Candida parapsilosis NOT DETECTED NOT DETECTED Final   Candida tropicalis NOT DETECTED NOT DETECTED Final   Cryptococcus neoformans/gattii NOT DETECTED NOT DETECTED Final    Comment: Performed at [facility], [address], [place], [place] [address]  Blood Culture (routine x 2)     Status: Abnormal   Collection Time: 05/29/20 12:47 PM   Specimen: BLOOD  Result Value Ref Range Status   Specimen Description   Final    BLOOD LEFT ANTECUBITAL Performed at [facility], [address] [place], [place] [address]    Special Requests   Final    BOTTLES DRAWN AEROBIC AND ANAEROBIC Blood Culture adequate volume Performed at [facility], [address] [place], [place] [address]    Culture  Setup Time   Final    GRAM POSITIVE COCCI AEROBIC BOTTLE ONLY CRITICAL VALUE NOTED.  VALUE IS CONSISTENT WITH PREVIOUSLY REPORTED AND CALLED VALUE.    Culture (A)  Final    STREPTOCOCCUS PNEUMONIAE SUSCEPTIBILITIES PERFORMED ON PREVIOUS CULTURE WITHIN THE LAST 5 DAYS. Performed at [facility], [place] [address] [place], [place] [address]    Report Status 06/02/2020 FINAL  Final  SARS Coronavirus 2 by RT PCR (hospital order, performed in [facility] hospital lab) Nasopharyngeal Nasopharyngeal Swab     Status: Abnormal   Collection Time: 05/29/20 12:52 PM   Specimen: Nasopharyngeal Swab  Result Value Ref Range Status   SARS Coronavirus 2 POSITIVE (A) NEGATIVE Final    Comment: RESULT CALLED TO, READ BACK BY AND VERIFIED WITH:  [email or web address],RN @ 1413 ON 05/29/2020, [email or web address] (NOTE) SARS-CoV-2 target nucleic acids are DETECTED  SARS-CoV-2 RNA is  generally detectable in upper respiratory specimens  during the acute phase of infection.  Positive results are indicative  of the presence of the identified virus, but do not rule out bacterial infection or co-infection with other pathogens not detected by the test.  Clinical correlation with patient history and  other diagnostic information is necessary to determine patient infection status.  The expected result is  negative.  Fact Sheet for Patients:   StrictlyIdeas.no   Fact Sheet for Healthcare Providers:   BankingDealers.co.za    This test is not yet approved or cleared by the [place] FDA and  has been authorized for detection and/or diagnosis of SARS-CoV-2 by FDA under an Emergency Use Authorization (EUA).  This EUA will remain in effect (mea ning this test can be used) for the duration of  the COVID-19 declaration under Section 564(b)(1) of the Act, 21 U.S.C. section 360-bbb-3(b)(1), unless the authorization is terminated or revoked sooner.  Performed at [facility], [address] [place], [place] [address]   Culture, blood (routine x 2)     Status: None (Preliminary result)   Collection Time: 06/01/20  7:46 AM   Specimen: BLOOD  Result Value Ref Range Status   Specimen Description BLOOD RIGHT ANTECUBITAL  Final   Special Requests   Final    BOTTLES DRAWN AEROBIC AND ANAEROBIC Blood Culture results may not be optimal due to an inadequate volume of blood received in culture bottles   Culture   Final    NO GROWTH 2 DAYS Performed at [facility], [place] [address], [place], [place] [address]    Report Status PENDING  Incomplete  Culture, blood (routine x 2)     Status: None (Preliminary result)   Collection Time: 06/01/20  7:58 AM   Specimen: BLOOD RIGHT FOREARM  Result Value Ref Range Status   Specimen Description BLOOD RIGHT FOREARM  Final   Special Requests   Final    BOTTLES DRAWN AEROBIC ONLY  Blood Culture results may not be optimal due to an inadequate volume of blood received in culture bottles   Culture   Final    NO GROWTH 2 DAYS Performed at [facility], [place] [address], [place], [place] [address]    Report Status PENDING  Incomplete    Radiology Reports CT ANGIO CHEST PE W OR WO CONTRAST  Result Date: 06/01/2020 CLINICAL DATA:  COVID pneumonia, left lower lobe consolidation, dyspnea EXAM: CT ANGIOGRAPHY CHEST WITH CONTRAST TECHNIQUE: Multidetector CT imaging of the chest was performed using the standard protocol during bolus administration of intravenous contrast. Multiplanar CT image reconstructions and MIPs were obtained to evaluate the vascular anatomy. CONTRAST:  69mL OMNIPAQUE IOHEXOL 350 MG/ML SOLN COMPARISON:  CT coronary calcium 11/11/2015 FINDINGS: Cardiovascular: Satisfactory opacification of the pulmonary arteries to the segmental level. No evidence of pulmonary embolism. Normal heart size. No pericardial effusion. The central pulmonary arteries are of normal caliber. The thoracic aorta is unremarkable. Mediastinum/Nodes: No enlarged mediastinal, hilar, or axillary lymph nodes. Thyroid gland, trachea, and esophagus demonstrate no significant findings. Lungs/Pleura: There is dense left lower lobe consolidation in keeping with changes of acute lobar pneumonia in the appropriate clinical setting. There is endobronchial debris resulting in impaction of a the bronchi of the left lower lobe. Mild scattered infiltrate is seen within the basilar lingula and right upper lobe, likely infectious or inflammatory and potentially related to endobronchial spread of disease. A small amount of debris is noted within the right lower lobe bronchus. Small left pleural effusion is present. No pneumothorax. Upper Abdomen: At least moderate hepatic steatosis is noted. Musculoskeletal: No acute bone abnormality. Review of the MIP images confirms the above findings. IMPRESSION: Dense  consolidation and airway impaction involving the left lower lobe compatible with changes of acute lobar pneumonia in the appropriate clinical setting. Small parapneumonic effusion noted. Scattered infiltrate within the right  upper lobe and basilar lingula as well as endobronchial debris within the left lower lobe possibly representing endobronchial spread of disease. Aspiration could also appear in this fashion, though its asymmetry involving the left lower lobe makes this less likely. No pulmonary embolism Electronically Signed   By: Fidela Salisbury MD   On: 06/01/2020 11:34   DG Chest Portable 1 View  Result Date: 05/29/2020 CLINICAL DATA:  COVID positive.  Shortness of breath.  Weakness. EXAM: PORTABLE CHEST 1 VIEW COMPARISON:  Two-view chest x-ray 01/13/2015 FINDINGS: Heart size is normal. Hilar fullness present bilaterally. Asymmetric left lower lobe airspace opacities are present. Upper lung fields are clear. IMPRESSION: 1. Asymmetric left lower lobe airspace disease compatible with pneumonia. 2. Bilateral hilar fullness. Underlying adenopathy is not excluded. Electronically Signed   By: San Morelle M.D.   On: 05/29/2020 11:35   DG Chest Port 1V same Day  Result Date: 05/31/2020 CLINICAL DATA:  COVID, shortness of breath EXAM: PORTABLE CHEST 1 VIEW COMPARISON:  05/29/2020 FINDINGS: Heart is normal size. Airspace opacity noted in the left lower lobe. Small left pleural effusion. No confluent opacity or effusion on the right. No acute bony abnormality. IMPRESSION: Left lower lobe pneumonia.  Small left effusion. Electronically Signed   By: Rolm Baptise M.D.   On: 05/31/2020 20:23   VAS [place] LOWER EXTREMITY VENOUS (DVT)  Result Date: 05/30/2020  Lower Venous DVT Study Indications: Covid.  Risk Factors: Surgery Patient reports partial LT knee replacement in January. Anticoagulation: Lovenox. Comparison       11-05-2015 Bilateral lower extremity venous was negative for Study:           DVT.  Performing Technologist: Darlin Coco RDMS  Examination Guidelines: A complete evaluation includes B-mode imaging, spectral Doppler, color Doppler, and power Doppler as needed of all accessible portions of each vessel. Bilateral testing is considered an integral part of a complete examination. Limited examinations for reoccurring indications may be performed as noted. The reflux portion of the exam is performed with the patient in reverse Trendelenburg.  +---------+---------------+---------+-----------+----------+--------------+ RIGHT    CompressibilityPhasicitySpontaneityPropertiesThrombus Aging +---------+---------------+---------+-----------+----------+--------------+ CFV      Full           Yes      Yes                                 +---------+---------------+---------+-----------+----------+--------------+ SFJ      Full                                                        +---------+---------------+---------+-----------+----------+--------------+ FV Prox  Full                                                        +---------+---------------+---------+-----------+----------+--------------+ FV Mid   Full                                                        +---------+---------------+---------+-----------+----------+--------------+  FV DistalFull                                                        +---------+---------------+---------+-----------+----------+--------------+ PFV      Full                                                        +---------+---------------+---------+-----------+----------+--------------+ POP      Full           Yes      Yes                                 +---------+---------------+---------+-----------+----------+--------------+ PTV      Full                                                        +---------+---------------+---------+-----------+----------+--------------+ PERO     Full                                                         +---------+---------------+---------+-----------+----------+--------------+   +---------+---------------+---------+-----------+----------+--------------+ LEFT     CompressibilityPhasicitySpontaneityPropertiesThrombus Aging +---------+---------------+---------+-----------+----------+--------------+ CFV      Full           Yes      Yes                                 +---------+---------------+---------+-----------+----------+--------------+ SFJ      Full                                                        +---------+---------------+---------+-----------+----------+--------------+ FV Prox  Full                                                        +---------+---------------+---------+-----------+----------+--------------+ FV Mid   Full                                                        +---------+---------------+---------+-----------+----------+--------------+ FV DistalFull                                                        +---------+---------------+---------+-----------+----------+--------------+  PFV      Full                                                        +---------+---------------+---------+-----------+----------+--------------+ POP      Full           Yes      Yes                                 +---------+---------------+---------+-----------+----------+--------------+ PTV      Full                                                        +---------+---------------+---------+-----------+----------+--------------+ PERO     Full                                                        +---------+---------------+---------+-----------+----------+--------------+    Summary: RIGHT: - There is no evidence of deep vein thrombosis in the lower extremity.  - No cystic structure found in the popliteal fossa.  LEFT: - There is no evidence of deep vein thrombosis in the lower extremity.  - No cystic structure  found in the popliteal fossa.  *See table(s) above for measurements and observations. Electronically signed by [name] Martinez MD on 05/30/2020 at 2:31:02 PM.    Final    ECHOCARDIOGRAM LIMITED  Result Date: 06/01/2020    ECHOCARDIOGRAM LIMITED REPORT   Patient Name:   [name] Date of Exam: 06/01/2020 Medical Rec #:  [id number]      Height:       64.0 in Accession #:    [id number]     Weight:       213.0 lb Date of Birth:  January 02, 1956      BSA:          2.009 m Patient Age:    26 years       BP:           132/85 mmHg Patient Gender: F              HR:           100 bpm. Exam Location:  Inpatient Procedure: Limited Echo, Limited Color Doppler, Cardiac Doppler and Intracardiac            Opacification Agent Indications:    Bacteremia R78.81  History:        Patient has prior history of Echocardiogram examinations, most                 recent 11/11/2015. Signs/Symptoms:Shortness of Breath and Fever;                 Risk Factors:Hypertension. COVID 19 pneumonia. Hepatitis C.                 Acute kidney injury.  Sonographer:    Darlina Sicilian RDCS Referring Phys: [place]  1. Left ventricular ejection fraction,  by estimation, is 60 to 65%. The left ventricle has normal function. The left ventricle has no regional wall motion abnormalities. Left ventricular diastolic parameters are consistent with Grade I diastolic dysfunction (impaired relaxation). Elevated left atrial pressure.  2. Right ventricular systolic function is normal. The right ventricular size is normal.  3. The mitral valve is normal in structure. No evidence of mitral valve regurgitation. No evidence of mitral stenosis.  4. The aortic valve is tricuspid. Aortic valve regurgitation is not visualized. Mild aortic valve sclerosis is present, with no evidence of aortic valve stenosis. FINDINGS  Left Ventricle: Left ventricular ejection fraction, by estimation, is 60 to 65%. The left ventricle has normal function. The left  ventricle has no regional wall motion abnormalities. Definity contrast agent was given IV to delineate the left ventricular  endocardial borders. The left ventricular internal cavity size was normal in size. There is no left ventricular hypertrophy. Left ventricular diastolic parameters are consistent with Grade I diastolic dysfunction (impaired relaxation). Elevated left atrial pressure. Right Ventricle: The right ventricular size is normal.Right ventricular systolic function is normal. Left Atrium: Left atrial size was normal in size. Right Atrium: Right atrial size was normal in size. Pericardium: There is no evidence of pericardial effusion. Mitral Valve: The mitral valve is normal in structure. No evidence of mitral valve stenosis. Tricuspid Valve: The tricuspid valve is normal in structure. Tricuspid valve regurgitation is trivial. No evidence of tricuspid stenosis. Aortic Valve: The aortic valve is tricuspid. Aortic valve regurgitation is not visualized. Mild aortic valve sclerosis is present, with no evidence of aortic valve stenosis. Pulmonic Valve: The pulmonic valve was not well visualized. Pulmonic valve regurgitation is not visualized. No evidence of pulmonic stenosis. Aorta: The aortic root is normal in size and structure.  LEFT VENTRICLE PLAX 2D LVIDd:         4.25 cm     Diastology LVIDs:         3.10 cm     LV e' medial:    7.07 cm/s LV PW:         0.80 cm     LV E/e' medial:  17.3 LV IVS:        0.95 cm     LV e' lateral:   7.07 cm/s                            LV E/e' lateral: 17.3  LV Volumes (MOD) LV vol d, MOD A2C: 66.8 ml LV vol d, MOD A4C: 77.7 ml LV vol s, MOD A2C: 29.6 ml LV vol s, MOD A4C: 35.3 ml LV SV MOD A2C:     37.2 ml LV SV MOD A4C:     77.7 ml LV SV MOD BP:      38.9 ml LEFT ATRIUM         Index LA diam:    2.90 cm 1.44 cm/m  AORTIC VALVE LVOT Vmax:   140.00 cm/s LVOT Vmean:  110.000 cm/s LVOT VTI:    0.201 m MITRAL VALVE MV Area (PHT): 3.72 cm     SHUNTS MV Decel Time: 204 msec      Systemic VTI: 0.20 m MV E velocity: 122.00 cm/s MV A velocity: 125.00 cm/s MV E/A ratio:  0.98 Kirk Ruths MD Electronically signed by Kirk Ruths MD Signature Date/Time: 06/01/2020/12:17:19 PM    Final

## 2020-06-04 ENCOUNTER — Inpatient Hospital Stay (HOSPITAL_COMMUNITY): Payer: Managed Care, Other (non HMO)

## 2020-06-04 ENCOUNTER — Encounter (HOSPITAL_COMMUNITY): Payer: Self-pay | Admitting: Internal Medicine

## 2020-06-04 DIAGNOSIS — J189 Pneumonia, unspecified organism: Secondary | ICD-10-CM

## 2020-06-04 DIAGNOSIS — J13 Pneumonia due to Streptococcus pneumoniae: Secondary | ICD-10-CM

## 2020-06-04 DIAGNOSIS — U071 COVID-19: Secondary | ICD-10-CM | POA: Diagnosis not present

## 2020-06-04 DIAGNOSIS — N179 Acute kidney failure, unspecified: Secondary | ICD-10-CM | POA: Diagnosis not present

## 2020-06-04 DIAGNOSIS — J918 Pleural effusion in other conditions classified elsewhere: Secondary | ICD-10-CM

## 2020-06-04 DIAGNOSIS — I10 Essential (primary) hypertension: Secondary | ICD-10-CM | POA: Diagnosis not present

## 2020-06-04 LAB — CBC
HCT: 35 % — ABNORMAL LOW (ref 36.0–46.0)
Hemoglobin: 11.3 g/dL — ABNORMAL LOW (ref 12.0–15.0)
MCH: 28.5 pg (ref 26.0–34.0)
MCHC: 32.3 g/dL (ref 30.0–36.0)
MCV: 88.4 fL (ref 80.0–100.0)
Platelets: 384 10*3/uL (ref 150–400)
RBC: 3.96 MIL/uL (ref 3.87–5.11)
RDW: 14.7 % (ref 11.5–15.5)
WBC: 15.6 10*3/uL — ABNORMAL HIGH (ref 4.0–10.5)
nRBC: 0 % (ref 0.0–0.2)

## 2020-06-04 LAB — COMPREHENSIVE METABOLIC PANEL
ALT: 17 U/L (ref 0–44)
AST: 24 U/L (ref 15–41)
Albumin: 2.4 g/dL — ABNORMAL LOW (ref 3.5–5.0)
Alkaline Phosphatase: 65 U/L (ref 38–126)
Anion gap: 14 (ref 5–15)
BUN: 17 mg/dL (ref 8–23)
CO2: 28 mmol/L (ref 22–32)
Calcium: 8.6 mg/dL — ABNORMAL LOW (ref 8.9–10.3)
Chloride: 95 mmol/L — ABNORMAL LOW (ref 98–111)
Creatinine, Ser: 0.81 mg/dL (ref 0.44–1.00)
GFR, Estimated: >60 mL/min (ref >60)
Glucose, Bld: 96 mg/dL (ref 70–99)
Potassium: 4.4 mmol/L (ref 3.5–5.1)
Sodium: 137 mmol/L (ref 135–145)
Total Bilirubin: 0.9 mg/dL (ref 0.3–1.2)
Total Protein: 6.5 g/dL (ref 6.5–8.1)

## 2020-06-04 LAB — D-DIMER, QUANTITATIVE: D-Dimer, Quant: 3.29 ug/mL-FEU — ABNORMAL HIGH (ref 0.00–0.50)

## 2020-06-04 LAB — C-REACTIVE PROTEIN: CRP: 23.1 mg/dL — ABNORMAL HIGH (ref <1.0)

## 2020-06-04 MED ORDER — LORAZEPAM 2 MG/ML IJ SOLN
INTRAMUSCULAR | Status: AC
  Filled 2020-06-04: qty 1

## 2020-06-04 MED ORDER — FUROSEMIDE 10 MG/ML IJ SOLN
40.0000 mg | Freq: Once | INTRAMUSCULAR | Status: AC
  Administered 2020-06-04: 40 mg via INTRAVENOUS
  Filled 2020-06-04: qty 4

## 2020-06-04 MED ORDER — LORAZEPAM 2 MG/ML IJ SOLN
2.0000 mg | Freq: Once | INTRAMUSCULAR | Status: AC
  Administered 2020-06-04: 2 mg via INTRAVENOUS

## 2020-06-04 MED ORDER — ALPRAZOLAM 0.25 MG PO TABS: 0.2500 mg | ORAL_TABLET | Freq: Two times a day (BID) | ORAL | Status: DC | PRN

## 2020-06-04 NOTE — Plan of Care (Signed)
  Problem: Clinical Measurements: Goal: Will remain free from infection Outcome: Progressing Goal: Diagnostic test results will improve Outcome: Progressing Goal: Respiratory complications will improve Outcome: Progressing   Problem: Respiratory: Goal: Will maintain a patent airway Outcome: Progressing

## 2020-06-04 NOTE — Consult Note (Signed)
Name: [name] MRN: [id number] DOB: 09-13-1955    ADMISSION DATE:  05/29/2020 CONSULTATION DATE: 06/04/2020  REFERRING MD : [facility]  CHIEF COMPLAINT: Status post Covid infection now with left lower lobe consolidation  BRIEF PATIENT DESCRIPTION: Morbid obese female in no acute distress  SIGNIFICANT EVENTS    STUDIES:  06/01/2020 CT chest left lower lobe consolidation   HISTORY OF PRESENT ILLNESS: 65 year old female who has past medical history is well-documented below who presented on 05/29/2020 with increasing respiratory distress found to have Covid.  Treated with usual pharmaceutical interventions with improvement.  Currently she is on room air with O2 saturations greater 94%.  CT scan on 06/01/2018 did reveal a consolidation in the left lower lobe consistent with pneumonia.  Ultrasound does reveal some pleural fluid in that space.  She is no acute respiratory distress.  Question if thoracentesis would be helpful at this time.   PAST MEDICAL HISTORY :   has a past medical history of Asthma, Depression, Erosive esophagitis, Headaches, cluster, Hepatitis C, and HTN (hypertension).  has a past surgical history that includes No past surgeries. Prior to Admission medications   Medication Sig Start Date End Date Taking? Authorizing Provider  baclofen (LIORESAL) 10 MG tablet Take 1 tablet by mouth daily as needed for muscle spasms. 04/29/20  Yes [provider]  Calcium Carb-Cholecalciferol (CALCIUM-VITAMIN D3) 600-400 MG-UNIT TABS Take 1 tablet by mouth daily.   Yes [provider]  chlorthalidone (HYGROTON) 25 MG tablet Take 25 mg by mouth daily. 08/15/19  Yes [provider]  irbesartan (AVAPRO) 150 MG tablet Take 1 tablet by mouth daily. 05/22/17  Yes [provider]  Multiple Vitamins-Minerals (MULTIVITAMIN PO) Take 1 tablet by mouth daily.   Yes [provider]  naproxen (NAPROSYN) 500 MG tablet Take 500 mg by mouth 2 (two) times daily. 04/19/20   Yes [provider]  naproxen sodium (ALEVE) 220 MG tablet Take 220 mg by mouth 2 (two) times daily as needed (pain).   Yes [provider]  omeprazole (PRILOSEC) 40 MG capsule Take 40 mg by mouth daily. 10/14/15  Yes [provider]  ondansetron (ZOFRAN) 4 MG tablet Take 4 mg by mouth every 8 (eight) hours as needed for nausea. 04/29/20  Yes [provider]  oxyCODONE (OXY IR/ROXICODONE) 5 MG immediate release tablet Take 5 mg by mouth 4 (four) times daily as needed for moderate pain. 05/17/20  Yes [provider]  PAXLOVID 20 x 150 MG & 10 x 100MG  TBPK Take 1 tablet by mouth daily.   Yes [provider]  sennosides-docusate sodium (SENOKOT-S) 8.6-50 MG tablet Take 1 tablet by mouth as needed (when taking pain medication). 04/29/20  Yes [provider]  Venlafaxine HCl 75 MG TB24 Take 37.5 mg by mouth daily.    Yes [provider]  gabapentin (NEURONTIN) 100 MG capsule Take 100 mg by mouth in the morning, at noon, and at bedtime. 05/01/20   [provider]   Allergies  Allergen Reactions  . Atenolol     FATIGUE/ HYPOTENSION  . Lisinopril     COUGH/ WHEEZING  . Paxil [Paroxetine Hcl]     INCREASED APPETITE FOR SWEETS  . Zoloft [Sertraline Hcl]     TINNITUS  . Elemental Sulfur Rash    FEVER    FAMILY HISTORY:  family history includes Bipolar disorder in her daughter; Colitis in her brother; Coronary artery disease in her mother; Crohn's disease in her sister; Diabetes Mellitus II in  her mother; Emphysema in her father; Heart attack in her sister; Heart disease in her brother, brother, and mother; Irritable bowel syndrome in her daughter; Liver disease in her sister; Stroke in her brother. SOCIAL HISTORY:  reports that she has never smoked. She has never used smokeless tobacco. She reports previous alcohol use.  REVIEW OF SYSTEMS:   10 point review of system taken, please see HPI for positives and  negatives.   SUBJECTIVE:  Awake alert no acute distress VITAL SIGNS: Temp:  [97.8 F (36.6 C)-98.2 F (36.8 C)] 98.2 F (36.8 C) (02/11 0727) Pulse Rate:  [89-100] 100 (02/11 0727) Resp:  [18-20] 20 (02/11 0727) BP: (119-158)/(68-94) 131/70 (02/11 0727) SpO2:  [92 %-99 %] 92 % (02/11 0727)  PHYSICAL EXAMINATION: General: Morbidly obese female is sitting in a chair no acute distress on room air Neuro: Grossly intact without focal defect HEENT: No JVD or lymphadenopathy is appreciated Cardiovascular: Heart sounds regular regular rate rhythm Lungs: Diminished in the bases left base with crackles and wheeze Abdomen: Soft nontender Musculoskeletal: Warm and dry Skin: Intact  Recent Labs  Lab 06/02/20 0053 06/03/20 0106 06/04/20 0159  NA 138 140 137  K 3.4* 3.0* 4.4  CL 98 94* 95*  CO2 28 30 28   BUN 13 15 17   CREATININE 0.79 0.88 0.81  GLUCOSE 107* 89 96   Recent Labs  Lab 06/02/20 0053 06/03/20 0106 06/04/20 0159  HGB 10.9* 11.1* 11.3*  HCT 34.1* 33.0* 35.0*  WBC 16.2* 16.2* 15.6*  PLT 329 393 384   DG Chest Port 1V same Day  Result Date: 06/04/2020 CLINICAL DATA:  65 year old female COVID-19. Left lower lobe pneumonia. EXAM: PORTABLE CHEST 1 VIEW COMPARISON:  Chest CT 06/01/2020 and earlier. FINDINGS: Portable AP upright view at 0441 hours. Mildly improved lung volumes from yesterday. Ongoing dense left lower lobe consolidation. Lingula also affected on chest CTA yesterday which demonstrated trace pleural fluid. Stable cardiac size and mediastinal contours. Right lung remains clear. Visualized tracheal air column is within normal limits. Paucity of bowel gas. Stable visualized osseous structures. IMPRESSION: Stable left lung base pneumonia.  Mildly improved lung volumes. Electronically Signed   By: Genevie Ann M.D.   On: 06/04/2020 07:10  Discussion: 65 year old female diagnosed with COVID 05/29/2020 and admitted to the hospital with notable respiratory distress.  She has a  past medical history significant for asthma depression hypertension hepatitis C.  She complains of fevers that reaches a level of 103.  She had not been vaccinated for Covid.  After admission she tolerated treatment well is now on room air and has no acute distress.  She does states she has some mild chest pain with coughing.  She did have CT of the chest 06/01/2020 significant for dense collagen consolidation in the left lower lobe compatible with changes of acute lobar pneumonia.  She does have reported small parapneumonic effusion also noted.  Pulmonary critical care was asked to evaluate for pleural effusion possible thoracentesis ASSESSMENT: Active Problems:   HTN (hypertension)   Depression   Asthma   ARF (acute renal failure) (HCC)   Pneumonia due to COVID-19 virus + 05/29/20   Hypokalemia   Hyponatremia   Lactic acidosis   Hypomagnesemia   Elevated d-dimer  Status post Covid 05/29/2020 now is on room air.  CT scan concerning for lobar pneumonia left lower lobe.  Pulmonary critical care asked to evaluate possible thoracentesis              PLAN: Ultrasound does  reveal some pleural effusion. Questionable wanted to wait and evaluate with CT scan in the near future versus thoracentesis at this time.  Continue with antimicrobial therapy is currently on Rocephin day 5  Continue pulmonary toilet  Richardson Landry Christofer Shen ACNP Acute Care Nurse Practitioner [place] Please consult [place] 06/04/2020, 10:21 AM

## 2020-06-04 NOTE — Progress Notes (Signed)
Occupational Therapy Treatment Patient Details Name: [name] MRN: [id number] DOB: 1955/12/17 Today's Date: 06/04/2020    History of present illness 65 y.o. female with PMHx of HTN, hepatitis C, asthma, depression-who tested positive for COVID-19 on 1/30 (Home test)-after multiple family exposures-presented to the hospital with fevers, chills, cough, diarrhea and shortness of breath-she was found to have AKI, left-sided infiltrate-subsequent blood cultures positive for pneumococcus. s/p L patial knee replacement 04-29-20.   OT comments  Pt is able to perform ADLs with supervision and increased time.  She is progressing well towards goals.   Follow Up Recommendations  No OT follow up;Supervision - Intermittent    Equipment Recommendations  None recommended by OT    Recommendations for Other Services Rehab consult    Precautions / Restrictions Precautions Precautions: Fall;Knee       Mobility Bed Mobility               General bed mobility comments: up in chair  Transfers Overall transfer level: Independent                    Balance Overall balance assessment: Mild deficits observed, not formally tested                                         ADL either performed or assessed with clinical judgement   ADL Overall ADL's : Needs assistance/impaired     Grooming: Wash/dry hands;Wash/dry face;Oral care;Brushing hair;Supervision/safety;Standing               Lower Body Dressing: Sit to/from stand;Supervision/safety   Toilet Transfer: Ambulation;Comfort height toilet;Supervision/safety (SPC)   Toileting- Clothing Manipulation and Hygiene: Supervision/safety;Sit to/from stand       Functional mobility during ADLs: Cane;Supervision/safety       Vision       Perception     Praxis      Cognition Arousal/Alertness: Awake/alert Behavior During Therapy: WFL for tasks assessed/performed Overall Cognitive Status: Within  Functional Limits for tasks assessed                                          Exercises     Shoulder Instructions       General Comments      Pertinent Vitals/ Pain       Pain Assessment: Faces Faces Pain Scale: Hurts a little bit Pain Location: sternum ribs Pain Descriptors / Indicators: Aching Pain Intervention(s): Monitored during session  Home Living                                          Prior Functioning/Environment              Frequency  Min 2X/week        Progress Toward Goals  OT Goals(current goals can now be found in the care plan section)  Progress towards OT goals: Progressing toward goals     Plan Discharge plan remains appropriate    Co-evaluation                 AM-PAC OT "6 Clicks" Daily Activity     Outcome Measure   Help from another person eating meals?: None Help  from another person taking care of personal grooming?: A Little Help from another person toileting, which includes using toliet, bedpan, or urinal?: A Little Help from another person bathing (including washing, rinsing, drying)?: A Little Help from another person to put on and taking off regular upper body clothing?: A Little Help from another person to put on and taking off regular lower body clothing?: A Little 6 Click Score: 19    End of Session Equipment Utilized During Treatment: Other (comment) (SPC)  OT Visit Diagnosis: Unsteadiness on feet (R26.81);Other abnormalities of gait and mobility (R26.89);Muscle weakness (generalized) (M62.81)   Activity Tolerance Patient tolerated treatment well   Patient Left in chair;with call bell/phone within reach   Nurse Communication Mobility status        Time: 5465-6812 OT Time Calculation (min): 28 min  Charges: OT General Charges $OT Visit: 1 Visit OT Treatments $Self Care/Home Management : 23-37 mins  [name] OTR/L Acute Rehabilitation Services Pager  [phone] Office [phone]    Lucille Passy M 06/04/2020, 12:10 PM

## 2020-06-04 NOTE — TOC Initial Note (Signed)
Transition of Care [facility]) - Initial/Assessment Note    Patient Details  Name: [name] MRN: [id number] Date of Birth: 22-Aug-1955  [facility] CM/SW Contact:    Sharin Mons, RN Phone Number: 06/04/2020, 4:38 PM  Clinical Narrative:                 Presents with fever, cough, SOB. Tested COVID positve 1/30.Hx of hypertension, hepatitis C, asthma, and depression, s/pL patial knee replacement 04-29-20. From home with daughter, Earnest Bailey.  NCM spoke with pt and daughter regarding d/c planning. NCM shared PT eval/ recommendations: Outpatient PT (resume OPPT for TKR). Daughter explained pt will not have transportation to Outpatient therapy when d/c  and requested HHPT services for mom. NCM made MD aware. HHPT order received. Choice offered. Pt without preference. Referral made with [facility] and accepted.  TOC team with continue to monitor and assist with needs.....  Expected Discharge Plan: [place] Barriers to Discharge: Continued Medical Work up   Patient Goals and CMS Choice     Choice offered to / list presented to : Adult Children Earnest Bailey ( daughter))  Expected Discharge Plan and Services Expected Discharge Plan: [place]   Discharge Planning Services: CM Consult Post Acute Care Choice: [place] arrangements for the past 2 months: [place]: PT [place]: [place] Date [place]: 06/04/20 Time HH Agency Contacted: [id number] Representative spoke with at [place]: Alvis Lemmings  Prior Living Arrangements/Services Living arrangements for the past 2 months: [place] with:: Adult Children Patient language and need for interpreter reviewed:: Yes Do you feel safe going back to the place where you live?: Yes      Need for Family Participation in Patient Care: Yes (Comment) Care giver support system in place?: Yes  (comment) Current home services: DME (rolling walker) Criminal Activity/Legal Involvement Pertinent to Current Situation/Hospitalization: No - Comment as needed  Activities of Daily Living Home Assistive Devices/Equipment: Cane (specify quad or straight),Grab bars in shower ADL Screening (condition at time of admission) Patient's cognitive ability adequate to safely complete daily activities?: Yes Is the patient deaf or have difficulty hearing?: No Does the patient have difficulty seeing, even when wearing glasses/contacts?: No Does the patient have difficulty concentrating, remembering, or making decisions?: No Patient able to express need for assistance with ADLs?: Yes Does the patient have difficulty dressing or bathing?: Yes Independently performs ADLs?: No Communication: Independent Dressing (OT): Needs assistance Is this a change from baseline?: Pre-admission baseline Grooming: Independent Feeding: Independent Bathing: Needs assistance Is this a change from baseline?: Pre-admission baseline Toileting: Independent In/Out Bed: Needs assistance Is this a change from baseline?: Pre-admission baseline Walks in Home: Independent with device (comment) Does the patient have difficulty walking or climbing stairs?: Yes Weakness of Legs: Both Weakness of Arms/Hands: None  Permission Sought/Granted   Permission granted to share information with : Yes, Verbal Permission Granted  Share Information with NAME: Cipriano B[place]er (Daughter) [phone]           Emotional Assessment Appearance:: Appears stated age Attitude/Demeanor/Rapport: Gracious Affect (typically observed): Accepting Orientation: : Oriented to Place,Oriented to Self,Oriented to  Time,Oriented to Situation Alcohol / Substance Use: Not Applicable Psych Involvement: No (comment)  Admission diagnosis:  Hypokalemia [E87.6] ARF (acute renal failure) (  [place]) [N17.9] AKI (acute kidney injury) ([place]) [N17.9] Pneumonia due to  COVID-19 virus [U07.1, J12.82] COVID-19 [U07.1] Patient Active Problem List   Diagnosis Date Noted  . ARF (acute renal failure) ([place]) 05/29/2020  . Pneumonia due to COVID-19 virus 05/29/2020  . Hypokalemia 05/29/2020  . Hyponatremia 05/29/2020  . Lactic acidosis 05/29/2020  . Hypomagnesemia 05/29/2020  . Elevated d-dimer 05/29/2020  . HTN (hypertension)   . Depression   . Asthma    PCP:  Lujean Amel, MD Pharmacy:   [facility] [address] [place] [address] [place] [address] Phone: [phone] Fax: [phone]     Social Determinants of Health (SDOH) Interventions    Readmission Risk Interventions No flowsheet data found.

## 2020-06-04 NOTE — Procedures (Signed)
Thoracentesis  Procedure Note  [name]  [id number]  [birth year]  Date:06/04/20  Time:3:32 PM   Provider Performing:Jaiel Saraceno W [place]   Procedure: Thoracentesis with imaging guidance (06770)  Indication(s) Pleural Effusion  Consent Risks of the procedure as well as the alternatives and risks of each were explained to the patient and/or caregiver.  Consent for the procedure was obtained and is signed in the bedside chart  Anesthesia Topical only with 1% lidocaine    Time Out Verified patient identification, verified procedure, site/side was marked, verified correct patient position, special equipment/implants available, medications/allergies/relevant history reviewed, required imaging and test results available.   Sterile Technique Maximal sterile technique including full sterile barrier drape, hand hygiene, sterile gown, sterile gloves, mask, hair covering, sterile ultrasound probe cover (if used).  Procedure Description Ultrasound was used to identify appropriate pleural anatomy for placement and overlying skin marked.  Area of drainage cleaned and draped in sterile fashion. Lidocaine was used to anesthetize the skin and subcutaneous tissue.  Able to get fluid in syringe, but unable to aspirate fluid once catheter in place. Procedure was aborted. No other appropriate window was appreciated with ultrasound. Procedure aborted.   Complications/Tolerance None; patient tolerated the procedure well. Chest X-ray is ordered to confirm no post-procedural complication.   EBL Minimal   Specimen(s) none  Georgann Housekeeper, AGACNP-BC [facility]  See Amion for personal pager PCCM on call pager [phone] until 7pm. Please call [place] 7p-7a. [phone]  06/04/2020 3:34 PM

## 2020-06-04 NOTE — Progress Notes (Signed)
PROGRESS NOTE                                                                                                                                                                                                             Patient Demographics:    [name], is a 65 y.o. female, DOB - 1955-09-15, ZDG:[id number]  Outpatient Primary MD for the patient is Lujean Amel, MD   Admit date - 05/29/2020   LOS - 5  Chief Complaint  Patient presents with  . Shortness of Breath       Brief Narrative: Patient is a 65 y.o. female with PMHx of HTN, hepatitis C, asthma, depression-who tested positive for COVID-19 on 1/30 (Home test)-after multiple family exposures-presented to the hospital with fevers, chills, cough, diarrhea and shortness of breath-she was found to have AKI, left-sided infiltrate-subsequent blood cultures positive for pneumococcus.  See below for further details.   COVID-19 vaccinated status: Unvaccinated  Significant Events: 2/5>> Admit to [facility] for AKI, PNA-blood cultures positive for pneumococcus 2/8>> left-sided pleuritic chest pain/2 L oxygen requirement/tachycardic  Significant studies: 2/5>>Chest x-ray: Left lower pneumonia, bilateral hilar fullness 2/6>> bilateral lower extremity Doppler: No DVT 2/8>> CTA chest: No PE, dense LLE-small parapneumonic effusion 2/8>>Echo: EF 60-65%, no wall motion abnormalities, grade 1 diastolic dysfunction  [id number] medications: Paxlovid:2/2>>2/8  Antibiotics: Rocephin: 2/6>>  Microbiology data: 2/5 >>blood culture: Streptococcus pneumonae 2/8>> blood culture: No growth  Procedures: None  Consults: None  DVT prophylaxis: enoxaparin (LOVENOX) injection 40 mg Start: 05/29/20 2000    Subjective:   Continues to have left-sided pleuritic chest pain.  Afebrile.   Assessment  & Plan :   Sepsis due to lobar pneumonia with pneumococcal bacteremia: Sepsis  physiology has resolved-tachycardia better-continues to have left-sided pleuritic chest pain and persistent leukocytosis.  Repeat blood cultures are negative.  Concerned that she may be developing worsening parapneumonic effusion/empyema-have asked CCM to see if there is enough pleural fluid to tap and rule out empyema.  Continue Rocephin-await formal pulmonary evaluation.    Left-sided pleuritic chest pain: Likely due to LL PNA-no PE on CTA chest.  Could also possibly be from musculoskeletal strain from coughing.  Supportive care.   AKI: Hemodynamically mediated due to sepsis/bacteremia-resolved.  Lower extremity edema: Probably due to hypoalbuminemia and diastolic CHF.  Repeat IV Lasix today-reassess tomorrow.    Hypomagnesemia:  Repleted  Hypokalemia: Repleted  COVID-19 infection: Suspect that main issues are from pneumococcal bacteremia/PNA-has finished a course of Paxlovid.  CRP significantly elevated-but this is from pneumococcal pneumonia/bacteremia and is slowly downtrending.  She remains stable on room air-if hypoxemia worsens-and if felt to be due to COVID-19 infection-we will initiate steroids and Remdesivir.  O2 requirements:  SpO2: 94 % O2 Flow Rate (L/min): (S) 2 L/min  COVID-19 Labs: Recent Labs    06/02/20 0053 06/03/20 0106 06/04/20 0159  DDIMER 2.17* 2.35* 3.29*  CRP 31.9* 30.8* 23.1*      Component Value Date/Time   BNP 179.3 (H) 06/01/2020 0803    Recent Labs  Lab 05/29/20 1232 05/31/20 0857 06/01/20 0215  PROCALCITON 23.17 7.70 5.15    Lab Results  Component Value Date   SARSCOV2NAA POSITIVE (A) 05/29/2020   [place] Not Detected 07/28/2019   [place] Not Detected 03/24/2019   SARSCOV2NAA Detected (A) 03/07/2019    Elevated D-dimer: Mild-Doppler/CTA chest negative.  On prophylactic Lovenox.  HTN-BP better controlled-continue Lopressor-getting as needed dosing of Lasix.  Continue to hold Avapro and chlorthalidone.    GERD: Continue  PPI  Depression: Continue Effexor  Asthma: Stable-not wheezing-continue bronchodilators.  History of hepatitis C-treated per patient.  Obesity: Estimated body mass index is 36.56 kg/m as calculated from the following:   Height as of this encounter: 5\' 4"  (1.626 m).   Weight as of this encounter: 96.6 kg.      GI prophylaxis: PPI  ABG: No results found for: PHART, PCO2ART, PO2ART, HCO3, TCO2, ACIDBASEDEF, O2SAT  Vent Settings: N/A    Condition - Guarded  Family Communication  : [phone] -on 2/11  Code Status :  Full Code  Diet :  Diet Order            Diet regular Room service appropriate? Yes; Fluid consistency: Thin  Diet effective now                  Disposition Plan  :   Status is: Observation  The patient will require care spanning > 2 midnights and should be moved to inpatient because: Inpatient level of care appropriate due to severity of illness  Dispo: The patient is from: Home              Anticipated d/c is to: Home              Anticipated d/c date is: > 3 days              Patient currently is not medically stable to d/c.   Difficult to place patient No    Barriers to discharge: Pneumococcal pneumonia with bacteremia-on IV Rocephin-concern for developing empyema-possible thoracocentesis later today.  Antimicorbials  :    Anti-infectives (From admission, onward)   Start     Dose/Rate Route Frequency Ordered Stop   05/31/20 1000  remdesivir 100 mg in sodium chloride 0.9 % 100 mL IVPB  Status:  Discontinued       "Followed by" Linked Group Details   100 mg 200 mL/hr over 30 Minutes Intravenous Daily 05/30/20 0931 05/30/20 1005   05/30/20 2200  nirmatrelvir/ritonavir EUA (PAXLOVID) TABS 2 tablet  Status:  Discontinued        2 tablet Oral 2 times daily 05/30/20 1316 05/30/20 1323   05/30/20 1400  nirmatrelvir/ritonavir EUA (PAXLOVID) TABS 2 tablet        2 tablet Oral 2 times daily 05/30/20 1323 06/01/20 1035   05/30/20  1300  nirmatrelvir/ritonavir EUA (PAXLOVID) TABS 2 tablet  Status:  Discontinued        2 tablet Oral 2 times daily 05/30/20 1208 05/30/20 1316   05/30/20 1030  remdesivir 200 mg in sodium chloride 0.9% 250 mL IVPB  Status:  Discontinued       "Followed by" Linked Group Details   200 mg 580 mL/hr over 30 Minutes Intravenous Once 05/30/20 0931 05/30/20 1005   05/30/20 0815  cefTRIAXone (ROCEPHIN) 2 g in sodium chloride 0.9 % 100 mL IVPB        2 g 200 mL/hr over 30 Minutes Intravenous Every 24 hours 05/30/20 0715        Inpatient Medications  Scheduled Meds: . acetaminophen  1,000 mg Oral Q8H  . benzonatate  200 mg Oral TID  . enoxaparin (LOVENOX) injection  40 mg Subcutaneous Q24H  . gabapentin  100 mg Oral BID  . guaiFENesin  600 mg Oral BID  . levalbuterol  2 puff Inhalation Q8H  . metoprolol tartrate  50 mg Oral BID  . pantoprazole  40 mg Oral BID  . sodium chloride flush  3 mL Intravenous Q12H  . venlafaxine XR  37.5 mg Oral Daily   Continuous Infusions: . sodium chloride    . cefTRIAXone (ROCEPHIN)  IV Stopped (06/04/20 0901)   PRN Meds:.sodium chloride, albuterol, alum & mag hydroxide-simeth, chlorpheniramine-HYDROcodone, ondansetron **OR** ondansetron (ZOFRAN) IV, oxyCODONE, sodium chloride flush   Time Spent in minutes  25    See all Orders from today for further details   Oren Binet M.D on 06/04/2020 at 12:26 PM  To page go to [email or web address] - use universal password  [facility] -  Office  [phone]    Objective:   Vitals:   06/03/20 2338 06/04/20 0425 06/04/20 0727 06/04/20 1100  BP: (!) 135/91 (!) 142/68 131/70 127/79  Pulse: 89 90 100 89  Resp: 18 20 20 20   Temp: 98 F (36.7 C) 98.1 F (36.7 C) 98.2 F (36.8 C) 98 F (36.7 C)  TempSrc: Oral Oral Oral Oral  SpO2: 95% 99% 92% 94%  Weight:      Height:        Wt Readings from Last 3 Encounters:  05/29/20 96.6 kg  06/04/17 95.8 kg  03/15/16 91.3 kg     Intake/Output Summary  (Last 24 hours) at 06/04/2020 1226 Last data filed at 06/04/2020 1031 Gross per 24 hour  Intake 660 ml  Output --  Net 660 ml     Physical Exam Gen Exam:Alert awake-not in any distress HEENT:atraumatic, normocephalic Chest: B/L clear to auscultation anteriorly CVS:S1S2 regular Abdomen:soft non tender, non distended Extremities:no edema Neurology: Non focal Skin: no rash   Data Review:    CBC Recent Labs  Lab 05/29/20 1101 05/30/20 0310 05/31/20 0456 06/01/20 0215 06/02/20 0053 06/03/20 0106 06/04/20 0159  WBC 8.6 7.0 8.7 13.7* 16.2* 16.2* 15.6*  HGB 11.9* 11.0* 10.8* 10.4* 10.9* 11.1* 11.3*  HCT 35.4* 34.2* 31.8* 30.8* 34.1* 33.0* 35.0*  PLT 176 172 113* 269 329 393 384  MCV 86.3 87.9 86.4 86.0 87.4 85.5 88.4  MCH 29.0 28.3 29.3 29.1 27.9 28.8 28.5  MCHC 33.6 32.2 34.0 33.8 32.0 33.6 32.3  RDW 14.0 13.9 14.3 14.5 14.6 14.7 14.7  LYMPHSABS 0.5* 1.0 1.5 1.4  --   --   --   MONOABS 0.2 0.2 0.3 1.0  --   --   --   EOSABS 0.0 0.0 0.1 0.1  --   --   --  BASOSABS 0.0 0.0 0.1 0.1  --   --   --     Chemistries  Recent Labs  Lab 05/30/20 0310 05/31/20 0456 06/01/20 0215 06/02/20 0053 06/03/20 0106 06/04/20 0159  NA 135 133* 136 138 140 137  K 2.8* 3.6 3.4* 3.4* 3.0* 4.4  CL 99 101 98 98 94* 95*  CO2 20* 19* 26 28 30 28   GLUCOSE 139* 90 110* 107* 89 96  BUN 31* 28* 19 13 15 17   CREATININE 1.70* 1.24* 1.02* 0.79 0.88 0.81  CALCIUM 7.9* 8.1* 8.7* 9.0 9.3 8.6*  MG 1.9 1.5* 2.6* 2.0 1.9  --   AST 23 26 22 25 19 24   ALT 20 21 23 22 20 17   ALKPHOS 61 65 70 83 77 65  BILITOT 0.9 0.5 0.6 0.7 0.7 0.9   ------------------------------------------------------------------------------------------------------------------ No results for input(s): CHOL, HDL, LDLCALC, TRIG, CHOLHDL, LDLDIRECT in the last 72 hours.  No results found for: HGBA1C ------------------------------------------------------------------------------------------------------------------ No results for  input(s): TSH, T4TOTAL, T3FREE, THYROIDAB in the last 72 hours.  Invalid input(s): FREET3 ------------------------------------------------------------------------------------------------------------------ No results for input(s): VITAMINB12, FOLATE, FERRITIN, TIBC, IRON, RETICCTPCT in the last 72 hours.  Coagulation profile No results for input(s): INR, PROTIME in the last 168 hours.  Recent Labs    06/03/20 0106 06/04/20 0159  DDIMER 2.35* 3.29*    Cardiac Enzymes No results for input(s): CKMB, TROPONINI, MYOGLOBIN in the last 168 hours.  Invalid input(s): CK ------------------------------------------------------------------------------------------------------------------    Component Value Date/Time   BNP 179.3 (H) 06/01/2020 0803    Micro Results Recent Results (from the past 240 hour(s))  Blood Culture (routine x 2)     Status: Abnormal   Collection Time: 05/29/20 12:41 PM   Specimen: BLOOD  Result Value Ref Range Status   Specimen Description   Final    BLOOD RIGHT ANTECUBITAL Performed at [facility], [address] [place], [place] [address]    Special Requests   Final    BOTTLES DRAWN AEROBIC AND ANAEROBIC Blood Culture adequate volume Performed at [facility], [address] [place], [place] [address]    Culture  Setup Time   Final    GRAM POSITIVE COCCI IN PAIRS IN BOTH AEROBIC AND ANAEROBIC BOTTLES CRITICAL RESULT CALLED TO, READ BACK BY AND VERIFIED WITH: Alanda Slim PHARMD @0826  05/30/20 EB Performed at [facility], [place] [address], [place], [place] [address]    Culture STREPTOCOCCUS PNEUMONIAE (A)  Final   Report Status 06/01/2020 FINAL  Final   Organism ID, Bacteria STREPTOCOCCUS PNEUMONIAE  Final      Susceptibility   Streptococcus pneumoniae - MIC*    ERYTHROMYCIN RESISTANT Resistant     LEVOFLOXACIN 0.5 SENSITIVE Sensitive     VANCOMYCIN <=0.12 SENSITIVE Sensitive     PENICILLIN (meningitis) <=0.06 SENSITIVE  Sensitive     PENO - penicillin <=0.06      PENICILLIN (non-meningitis) <=0.06 SENSITIVE Sensitive     PENICILLIN (oral) <=0.06 SENSITIVE Sensitive     CEFTRIAXONE (non-meningitis) <=0.12 SENSITIVE Sensitive     CEFTRIAXONE (meningitis) <=0.12 SENSITIVE Sensitive     * STREPTOCOCCUS PNEUMONIAE  Blood Culture ID Panel (Reflexed)     Status: Abnormal   Collection Time: 05/29/20 12:41 PM  Result Value Ref Range Status   Enterococcus faecalis NOT DETECTED NOT DETECTED Final   Enterococcus Faecium NOT DETECTED NOT DETECTED Final   Listeria monocytogenes NOT DETECTED NOT DETECTED Final   Staphylococcus species NOT DETECTED NOT DETECTED  Final   Staphylococcus aureus (BCID) NOT DETECTED NOT DETECTED Final   Staphylococcus epidermidis NOT DETECTED NOT DETECTED Final   Staphylococcus lugdunensis NOT DETECTED NOT DETECTED Final   Streptococcus species DETECTED (A) NOT DETECTED Final    Comment: CRITICAL RESULT CALLED TO, READ BACK BY AND VERIFIED WITH: CATHY PIERCE PHARMD @0826  05/30/20 EB    Streptococcus agalactiae NOT DETECTED NOT DETECTED Final   Streptococcus pneumoniae DETECTED (A) NOT DETECTED Final    Comment: CRITICAL RESULT CALLED TO, READ BACK BY AND VERIFIED WITH: CATHY PIERCE PHARMD @0826  05/30/20 EB    Streptococcus pyogenes NOT DETECTED NOT DETECTED Final   A.calcoaceticus-baumannii NOT DETECTED NOT DETECTED Final   Bacteroides fragilis NOT DETECTED NOT DETECTED Final   Enterobacterales NOT DETECTED NOT DETECTED Final   Enterobacter cloacae complex NOT DETECTED NOT DETECTED Final   Escherichia coli NOT DETECTED NOT DETECTED Final   Klebsiella aerogenes NOT DETECTED NOT DETECTED Final   Klebsiella oxytoca NOT DETECTED NOT DETECTED Final   Klebsiella pneumoniae NOT DETECTED NOT DETECTED Final   Proteus species NOT DETECTED NOT DETECTED Final   Salmonella species NOT DETECTED NOT DETECTED Final   Serratia marcescens NOT DETECTED NOT DETECTED Final   Haemophilus influenzae NOT  DETECTED NOT DETECTED Final   Neisseria meningitidis NOT DETECTED NOT DETECTED Final   Pseudomonas aeruginosa NOT DETECTED NOT DETECTED Final   Stenotrophomonas maltophilia NOT DETECTED NOT DETECTED Final   Candida albicans NOT DETECTED NOT DETECTED Final   Candida auris NOT DETECTED NOT DETECTED Final   Candida glabrata NOT DETECTED NOT DETECTED Final   Candida krusei NOT DETECTED NOT DETECTED Final   Candida parapsilosis NOT DETECTED NOT DETECTED Final   Candida tropicalis NOT DETECTED NOT DETECTED Final   Cryptococcus neoformans/gattii NOT DETECTED NOT DETECTED Final    Comment: Performed at [facility], [address], [place], [place] [address]  Blood Culture (routine x 2)     Status: Abnormal   Collection Time: 05/29/20 12:47 PM   Specimen: BLOOD  Result Value Ref Range Status   Specimen Description   Final    BLOOD LEFT ANTECUBITAL Performed at [facility], [address] [place], [place] [address]    Special Requests   Final    BOTTLES DRAWN AEROBIC AND ANAEROBIC Blood Culture adequate volume Performed at [facility], [address] [place], [place] [address]    Culture  Setup Time   Final    GRAM POSITIVE COCCI AEROBIC BOTTLE ONLY CRITICAL VALUE NOTED.  VALUE IS CONSISTENT WITH PREVIOUSLY REPORTED AND CALLED VALUE.    Culture (A)  Final    STREPTOCOCCUS PNEUMONIAE SUSCEPTIBILITIES PERFORMED ON PREVIOUS CULTURE WITHIN THE LAST 5 DAYS. Performed at [facility], [place] [address], [place], [place] [address]    Report Status 06/02/2020 FINAL  Final  SARS Coronavirus 2 by RT PCR (hospital order, performed in [facility] hospital lab) Nasopharyngeal Nasopharyngeal Swab     Status: Abnormal   Collection Time: 05/29/20 12:52 PM   Specimen: Nasopharyngeal Swab  Result Value Ref Range Status   SARS Coronavirus 2 POSITIVE (A) NEGATIVE Final    Comment: RESULT CALLED TO, READ BACK BY AND VERIFIED WITH:  [email or web address],RN @ 1413 ON 05/29/2020,  [email or web address] (NOTE) SARS-CoV-2 target nucleic acids are DETECTED  SARS-CoV-2 RNA is generally detectable in upper respiratory specimens  during the acute phase of infection.  Positive results are indicative  of the presence of the identified virus, but do not  rule out bacterial infection or co-infection with other pathogens not detected by the test.  Clinical correlation with patient history and  other diagnostic information is necessary to determine patient infection status.  The expected result is negative.  Fact Sheet for Patients:   StrictlyIdeas.no   Fact Sheet for Healthcare Providers:   BankingDealers.co.za    This test is not yet approved or cleared by the [place] FDA and  has been authorized for detection and/or diagnosis of SARS-CoV-2 by FDA under an Emergency Use Authorization (EUA).  This EUA will remain in effect (mea ning this test can be used) for the duration of  the COVID-19 declaration under Section 564(b)(1) of the Act, 21 U.S.C. section 360-bbb-3(b)(1), unless the authorization is terminated or revoked sooner.  Performed at [facility], [address] [place], [place] [address]   Culture, blood (routine x 2)     Status: None (Preliminary result)   Collection Time: 06/01/20  7:46 AM   Specimen: BLOOD  Result Value Ref Range Status   Specimen Description BLOOD RIGHT ANTECUBITAL  Final   Special Requests   Final    BOTTLES DRAWN AEROBIC AND ANAEROBIC Blood Culture results may not be optimal due to an inadequate volume of blood received in culture bottles   Culture   Final    NO GROWTH 3 DAYS Performed at [facility], [place] [address], [place], [place] [address]    Report Status PENDING  Incomplete  Culture, blood (routine x 2)     Status: None (Preliminary result)   Collection Time: 06/01/20  7:58 AM   Specimen: BLOOD RIGHT FOREARM  Result Value Ref Range Status   Specimen Description  BLOOD RIGHT FOREARM  Final   Special Requests   Final    BOTTLES DRAWN AEROBIC ONLY Blood Culture results may not be optimal due to an inadequate volume of blood received in culture bottles   Culture   Final    NO GROWTH 3 DAYS Performed at [facility], [place] [address] [place], [place] [address]    Report Status PENDING  Incomplete    Radiology Reports CT ANGIO CHEST PE W OR WO CONTRAST  Result Date: 06/01/2020 CLINICAL DATA:  COVID pneumonia, left lower lobe consolidation, dyspnea EXAM: CT ANGIOGRAPHY CHEST WITH CONTRAST TECHNIQUE: Multidetector CT imaging of the chest was performed using the standard protocol during bolus administration of intravenous contrast. Multiplanar CT image reconstructions and MIPs were obtained to evaluate the vascular anatomy. CONTRAST:  41mL OMNIPAQUE IOHEXOL 350 MG/ML SOLN COMPARISON:  CT coronary calcium 11/11/2015 FINDINGS: Cardiovascular: Satisfactory opacification of the pulmonary arteries to the segmental level. No evidence of pulmonary embolism. Normal heart size. No pericardial effusion. The central pulmonary arteries are of normal caliber. The thoracic aorta is unremarkable. Mediastinum/Nodes: No enlarged mediastinal, hilar, or axillary lymph nodes. Thyroid gland, trachea, and esophagus demonstrate no significant findings. Lungs/Pleura: There is dense left lower lobe consolidation in keeping with changes of acute lobar pneumonia in the appropriate clinical setting. There is endobronchial debris resulting in impaction of a the bronchi of the left lower lobe. Mild scattered infiltrate is seen within the basilar lingula and right upper lobe, likely infectious or inflammatory and potentially related to endobronchial spread of disease. A small amount of debris is noted within the right lower lobe bronchus. Small left pleural effusion is present. No pneumothorax. Upper Abdomen: At least moderate hepatic steatosis is noted. Musculoskeletal: No acute bone  abnormality. Review of the MIP  images confirms the above findings. IMPRESSION: Dense consolidation and airway impaction involving the left lower lobe compatible with changes of acute lobar pneumonia in the appropriate clinical setting. Small parapneumonic effusion noted. Scattered infiltrate within the right upper lobe and basilar lingula as well as endobronchial debris within the left lower lobe possibly representing endobronchial spread of disease. Aspiration could also appear in this fashion, though its asymmetry involving the left lower lobe makes this less likely. No pulmonary embolism Electronically Signed   By: Fidela Salisbury MD   On: 06/01/2020 11:34   DG Chest Portable 1 View  Result Date: 05/29/2020 CLINICAL DATA:  COVID positive.  Shortness of breath.  Weakness. EXAM: PORTABLE CHEST 1 VIEW COMPARISON:  Two-view chest x-ray 01/13/2015 FINDINGS: Heart size is normal. Hilar fullness present bilaterally. Asymmetric left lower lobe airspace opacities are present. Upper lung fields are clear. IMPRESSION: 1. Asymmetric left lower lobe airspace disease compatible with pneumonia. 2. Bilateral hilar fullness. Underlying adenopathy is not excluded. Electronically Signed   By: San Morelle M.D.   On: 05/29/2020 11:35   DG Chest Port 1V same Day  Result Date: 06/04/2020 CLINICAL DATA:  65 year old female COVID-19. Left lower lobe pneumonia. EXAM: PORTABLE CHEST 1 VIEW COMPARISON:  Chest CT 06/01/2020 and earlier. FINDINGS: Portable AP upright view at 0441 hours. Mildly improved lung volumes from yesterday. Ongoing dense left lower lobe consolidation. Lingula also affected on chest CTA yesterday which demonstrated trace pleural fluid. Stable cardiac size and mediastinal contours. Right lung remains clear. Visualized tracheal air column is within normal limits. Paucity of bowel gas. Stable visualized osseous structures. IMPRESSION: Stable left lung base pneumonia.  Mildly improved lung volumes.  Electronically Signed   By: Genevie Ann M.D.   On: 06/04/2020 07:10   DG Chest Port 1V same Day  Result Date: 05/31/2020 CLINICAL DATA:  COVID, shortness of breath EXAM: PORTABLE CHEST 1 VIEW COMPARISON:  05/29/2020 FINDINGS: Heart is normal size. Airspace opacity noted in the left lower lobe. Small left pleural effusion. No confluent opacity or effusion on the right. No acute bony abnormality. IMPRESSION: Left lower lobe pneumonia.  Small left effusion. Electronically Signed   By: Rolm Baptise M.D.   On: 05/31/2020 20:23   VAS [place] LOWER EXTREMITY VENOUS (DVT)  Result Date: 05/30/2020  Lower Venous DVT Study Indications: Covid.  Risk Factors: Surgery Patient reports partial LT knee replacement in January. Anticoagulation: Lovenox. Comparison       11-05-2015 Bilateral lower extremity venous was negative for Study:           DVT. Performing Technologist: Darlin Coco RDMS  Examination Guidelines: A complete evaluation includes B-mode imaging, spectral Doppler, color Doppler, and power Doppler as needed of all accessible portions of each vessel. Bilateral testing is considered an integral part of a complete examination. Limited examinations for reoccurring indications may be performed as noted. The reflux portion of the exam is performed with the patient in reverse Trendelenburg.  +---------+---------------+---------+-----------+----------+--------------+ RIGHT    CompressibilityPhasicitySpontaneityPropertiesThrombus Aging +---------+---------------+---------+-----------+----------+--------------+ CFV      Full           Yes      Yes                                 +---------+---------------+---------+-----------+----------+--------------+ SFJ      Full                                                        +---------+---------------+---------+-----------+----------+--------------+  FV Prox  Full                                                         +---------+---------------+---------+-----------+----------+--------------+ FV Mid   Full                                                        +---------+---------------+---------+-----------+----------+--------------+ FV DistalFull                                                        +---------+---------------+---------+-----------+----------+--------------+ PFV      Full                                                        +---------+---------------+---------+-----------+----------+--------------+ POP      Full           Yes      Yes                                 +---------+---------------+---------+-----------+----------+--------------+ PTV      Full                                                        +---------+---------------+---------+-----------+----------+--------------+ PERO     Full                                                        +---------+---------------+---------+-----------+----------+--------------+   +---------+---------------+---------+-----------+----------+--------------+ LEFT     CompressibilityPhasicitySpontaneityPropertiesThrombus Aging +---------+---------------+---------+-----------+----------+--------------+ CFV      Full           Yes      Yes                                 +---------+---------------+---------+-----------+----------+--------------+ SFJ      Full                                                        +---------+---------------+---------+-----------+----------+--------------+ FV Prox  Full                                                        +---------+---------------+---------+-----------+----------+--------------+  FV Mid   Full                                                        +---------+---------------+---------+-----------+----------+--------------+ FV DistalFull                                                         +---------+---------------+---------+-----------+----------+--------------+ PFV      Full                                                        +---------+---------------+---------+-----------+----------+--------------+ POP      Full           Yes      Yes                                 +---------+---------------+---------+-----------+----------+--------------+ PTV      Full                                                        +---------+---------------+---------+-----------+----------+--------------+ PERO     Full                                                        +---------+---------------+---------+-----------+----------+--------------+    Summary: RIGHT: - There is no evidence of deep vein thrombosis in the lower extremity.  - No cystic structure found in the popliteal fossa.  LEFT: - There is no evidence of deep vein thrombosis in the lower extremity.  - No cystic structure found in the popliteal fossa.  *See table(s) above for measurements and observations. Electronically signed by [name] Martinez MD on 05/30/2020 at 2:31:02 PM.    Final    ECHOCARDIOGRAM LIMITED  Result Date: 06/01/2020    ECHOCARDIOGRAM LIMITED REPORT   Patient Name:   [name] Date of Exam: 06/01/2020 Medical Rec #:  [id number]      Height:       64.0 in Accession #:    [id number]     Weight:       213.0 lb Date of Birth:  1955/10/01      BSA:          2.009 m Patient Age:    65 years       BP:           132/85 mmHg Patient Gender: F              HR:           100 bpm. Exam Location:  Inpatient Procedure: Limited Echo, Limited Color Doppler, Cardiac Doppler and Intracardiac  Opacification Agent Indications:    Bacteremia R78.81  History:        Patient has prior history of Echocardiogram examinations, most                 recent 11/11/2015. Signs/Symptoms:Shortness of Breath and Fever;                 Risk Factors:Hypertension. COVID 19 pneumonia. Hepatitis C.                 Acute kidney  injury.  Sonographer:    Darlina Sicilian RDCS Referring Phys: [place]  1. Left ventricular ejection fraction, by estimation, is 60 to 65%. The left ventricle has normal function. The left ventricle has no regional wall motion abnormalities. Left ventricular diastolic parameters are consistent with Grade I diastolic dysfunction (impaired relaxation). Elevated left atrial pressure.  2. Right ventricular systolic function is normal. The right ventricular size is normal.  3. The mitral valve is normal in structure. No evidence of mitral valve regurgitation. No evidence of mitral stenosis.  4. The aortic valve is tricuspid. Aortic valve regurgitation is not visualized. Mild aortic valve sclerosis is present, with no evidence of aortic valve stenosis. FINDINGS  Left Ventricle: Left ventricular ejection fraction, by estimation, is 60 to 65%. The left ventricle has normal function. The left ventricle has no regional wall motion abnormalities. Definity contrast agent was given IV to delineate the left ventricular  endocardial borders. The left ventricular internal cavity size was normal in size. There is no left ventricular hypertrophy. Left ventricular diastolic parameters are consistent with Grade I diastolic dysfunction (impaired relaxation). Elevated left atrial pressure. Right Ventricle: The right ventricular size is normal.Right ventricular systolic function is normal. Left Atrium: Left atrial size was normal in size. Right Atrium: Right atrial size was normal in size. Pericardium: There is no evidence of pericardial effusion. Mitral Valve: The mitral valve is normal in structure. No evidence of mitral valve stenosis. Tricuspid Valve: The tricuspid valve is normal in structure. Tricuspid valve regurgitation is trivial. No evidence of tricuspid stenosis. Aortic Valve: The aortic valve is tricuspid. Aortic valve regurgitation is not visualized. Mild aortic valve sclerosis is present, with no  evidence of aortic valve stenosis. Pulmonic Valve: The pulmonic valve was not well visualized. Pulmonic valve regurgitation is not visualized. No evidence of pulmonic stenosis. Aorta: The aortic root is normal in size and structure.  LEFT VENTRICLE PLAX 2D LVIDd:         4.25 cm     Diastology LVIDs:         3.10 cm     LV e' medial:    7.07 cm/s LV PW:         0.80 cm     LV E/e' medial:  17.3 LV IVS:        0.95 cm     LV e' lateral:   7.07 cm/s                            LV E/e' lateral: 17.3  LV Volumes (MOD) LV vol d, MOD A2C: 66.8 ml LV vol d, MOD A4C: 77.7 ml LV vol s, MOD A2C: 29.6 ml LV vol s, MOD A4C: 35.3 ml LV SV MOD A2C:     37.2 ml LV SV MOD A4C:     77.7 ml LV SV MOD BP:      38.9 ml LEFT ATRIUM  Index LA diam:    2.90 cm 1.44 cm/m  AORTIC VALVE LVOT Vmax:   140.00 cm/s LVOT Vmean:  110.000 cm/s LVOT VTI:    0.201 m MITRAL VALVE MV Area (PHT): 3.72 cm     SHUNTS MV Decel Time: 204 msec     Systemic VTI: 0.20 m MV E velocity: 122.00 cm/s MV A velocity: 125.00 cm/s MV E/A ratio:  0.98 Kirk Ruths MD Electronically signed by Kirk Ruths MD Signature Date/Time: 06/01/2020/12:17:19 PM    Final

## 2020-06-04 NOTE — Progress Notes (Addendum)
Physical Therapy Treatment Patient Details Name: [name] MRN: [id number] DOB: 03-28-1956 Today's Date: 06/04/2020    History of Present Illness Pt is a 65 y.o. female who tested (+) COVID-19 on 05/23/20, now admitted 05/29/20 with SOB, cough, fever. Workup for acute hypoxic respiratory failure due to COVID-19, AKI, L side infiltrate with blood cultures (+) pneumococcus. Pt with pleural effusion; considering thoracentesis. PMH includes recent L partial TKR (04/29/20), HTN, Hep C, asthma, depression.   PT Comments    Pt progressing well with mobility. Able to perform gait training with SPC, pt ambulating mod independent; endorses fatigue after long walk. Also performed BLE therex; encouraged pt to perform this more frequently, especially L knee ROM. Will continue to follow acutely to progress LLE therex s/p recent L TKA until pt can resume with OP ortho PT services.    Follow Up Recommendations  Outpatient PT (resume OPPT)     Equipment Recommendations  None recommended by PT    Recommendations for Other Services       Precautions / Restrictions Precautions Precautions: Fall;Knee Precaution Comments: Recent partial total knee replacement 04/29/20    Mobility  Bed Mobility               General bed mobility comments: Seated in recliner    Transfers Overall transfer level: Independent Equipment used: None;Straight cane Transfers: Sit to/from Stand              Ambulation/Gait Ambulation/Gait assistance: Mod assist Gait Distance (Feet): 600 Feet Assistive device: Straight cane Gait Pattern/deviations: Step-through pattern;Decreased stride length Gait velocity: Decreased   General Gait Details: Able to ambulate in room without DME, reaching to furniture for UE support; hallway ambulation mod indep with SPC after initial cues for sequencing and adjustment of cane height. Endorses fatigue by end of long walk; VSS on RA   Stairs             Wheelchair  Mobility    Modified Rankin (Stroke Patients Only)       Balance Overall balance assessment: Needs assistance   Sitting balance-Leahy Scale: Good       Standing balance-Leahy Scale: Fair Standing balance comment: Stability improved with SPC use                            Cognition Arousal/Alertness: Awake/alert Behavior During Therapy: WFL for tasks assessed/performed Overall Cognitive Status: Within Functional Limits for tasks assessed                                        Exercises General Exercises - Lower Extremity Long Arc Quad: AROM;Left;Seated Heel Slides: AROM;Left;Seated Toe Raises: AROM;Both;10 reps;Standing Mini-Sqauts: AROM;Both;10 reps;Standing Other Exercises Other Exercises: FV x5, IS x5 (<250 mL - pt discouraged by this, educ on importance of still performing, cues for technique)    General Comments General comments (skin integrity, edema, etc.): HR up to 90s, SpO2 93% on RA      Pertinent Vitals/Pain Pain Assessment: Faces Faces Pain Scale: Hurts a little bit Pain Location: abdomen Pain Descriptors / Indicators: Sore Pain Intervention(s): Monitored during session    Home Living                      Prior Function            PT Goals (current goals can now  be found in the care plan section) Acute Rehab PT Goals Patient Stated Goal: Get back to playing with grandkids Progress towards PT goals: Progressing toward goals    Frequency    Min 3X/week      PT Plan Current plan remains appropriate    Co-evaluation              AM-PAC PT "6 Clicks" Mobility   Outcome Measure  Help needed turning from your back to your side while in a flat bed without using bedrails?: None Help needed moving from lying on your back to sitting on the side of a flat bed without using bedrails?: None Help needed moving to and from a bed to a chair (including a wheelchair)?: None Help needed standing up from a chair  using your arms (e.g., wheelchair or bedside chair)?: None Help needed to walk in hospital room?: None Help needed climbing 3-5 steps with a railing? : A Little 6 Click Score: 23    End of Session Equipment Utilized During Treatment: Gait belt Activity Tolerance: Patient tolerated treatment well Patient left: in chair;with call bell/phone within reach Nurse Communication: Mobility status PT Visit Diagnosis: Other abnormalities of gait and mobility (R26.89);Difficulty in walking, not elsewhere classified (R26.2)     Time: 7169-6789 PT Time Calculation (min) (ACUTE ONLY): 25 min  Charges:  $Gait Training: 8-22 mins $Therapeutic Exercise: 8-22 mins                     Mabeline Caras, PT, DPT Acute Rehabilitation Services  Pager [phone] Office [place] 06/04/2020, 2:02 PM

## 2020-06-05 ENCOUNTER — Telehealth: Payer: Self-pay | Admitting: Critical Care Medicine

## 2020-06-05 DIAGNOSIS — R7881 Bacteremia: Secondary | ICD-10-CM | POA: Diagnosis not present

## 2020-06-05 DIAGNOSIS — Z8616 Personal history of COVID-19: Secondary | ICD-10-CM

## 2020-06-05 DIAGNOSIS — J90 Pleural effusion, not elsewhere classified: Secondary | ICD-10-CM | POA: Diagnosis not present

## 2020-06-05 DIAGNOSIS — N179 Acute kidney failure, unspecified: Secondary | ICD-10-CM | POA: Diagnosis not present

## 2020-06-05 LAB — COMPREHENSIVE METABOLIC PANEL
ALT: 16 U/L (ref 0–44)
AST: 16 U/L (ref 15–41)
Albumin: 2.5 g/dL — ABNORMAL LOW (ref 3.5–5.0)
Alkaline Phosphatase: 71 U/L (ref 38–126)
Anion gap: 12 (ref 5–15)
BUN: 17 mg/dL (ref 8–23)
CO2: 30 mmol/L (ref 22–32)
Calcium: 8.8 mg/dL — ABNORMAL LOW (ref 8.9–10.3)
Chloride: 95 mmol/L — ABNORMAL LOW (ref 98–111)
Creatinine, Ser: 0.79 mg/dL (ref 0.44–1.00)
GFR, Estimated: >60 mL/min (ref >60)
Glucose, Bld: 102 mg/dL — ABNORMAL HIGH (ref 70–99)
Potassium: 3.2 mmol/L — ABNORMAL LOW (ref 3.5–5.1)
Sodium: 137 mmol/L (ref 135–145)
Total Bilirubin: 0.5 mg/dL (ref 0.3–1.2)
Total Protein: 6.8 g/dL (ref 6.5–8.1)

## 2020-06-05 LAB — D-DIMER, QUANTITATIVE: D-Dimer, Quant: 4.02 ug/mL-FEU — ABNORMAL HIGH (ref 0.00–0.50)

## 2020-06-05 LAB — CBC
HCT: 32.8 % — ABNORMAL LOW (ref 36.0–46.0)
Hemoglobin: 11.2 g/dL — ABNORMAL LOW (ref 12.0–15.0)
MCH: 29.3 pg (ref 26.0–34.0)
MCHC: 34.1 g/dL (ref 30.0–36.0)
MCV: 85.9 fL (ref 80.0–100.0)
Platelets: 454 10*3/uL — ABNORMAL HIGH (ref 150–400)
RBC: 3.82 MIL/uL — ABNORMAL LOW (ref 3.87–5.11)
RDW: 14.6 % (ref 11.5–15.5)
WBC: 14.7 10*3/uL — ABNORMAL HIGH (ref 4.0–10.5)
nRBC: 0 % (ref 0.0–0.2)

## 2020-06-05 LAB — C-REACTIVE PROTEIN: CRP: 23 mg/dL — ABNORMAL HIGH (ref <1.0)

## 2020-06-05 MED ORDER — FUROSEMIDE 10 MG/ML IJ SOLN
40.0000 mg | Freq: Once | INTRAMUSCULAR | Status: AC
  Administered 2020-06-05: 40 mg via INTRAVENOUS
  Filled 2020-06-05: qty 4

## 2020-06-05 MED ORDER — METOPROLOL TARTRATE 50 MG PO TABS: 50.0000 mg | ORAL_TABLET | Freq: Two times a day (BID) | ORAL | 0 refills | Status: DC

## 2020-06-05 MED ORDER — POTASSIUM CHLORIDE CRYS ER 20 MEQ PO TBCR
40.0000 meq | EXTENDED_RELEASE_TABLET | Freq: Four times a day (QID) | ORAL | Status: DC
  Administered 2020-06-05: 40 meq via ORAL
  Filled 2020-06-05: qty 2

## 2020-06-05 MED ORDER — AMOXICILLIN 500 MG PO CAPS: 1000.0000 mg | ORAL_CAPSULE | Freq: Three times a day (TID) | ORAL | 0 refills | Status: AC

## 2020-06-05 NOTE — Telephone Encounter (Signed)
Please make a post-hospital follow up appointment within the next week to 10 days. Needs CXR at appointment.   Julian Hy, DO 06/05/20 10:41 AM [facility]  From 7AM- 7PM if no response to pager, please call [phone]. After hours, 7PM- 7AM, please call [place]  [phone].

## 2020-06-05 NOTE — Discharge Planning (Signed)
Nsg Discharge Note  Admit Date:  05/29/2020 Discharge date: 06/05/2020   Nicolas Sisler to be D/C'd Home per MD order.  AVS completed.     Discharge Medication: Allergies as of 06/05/2020      Reactions   Atenolol    FATIGUE/ HYPOTENSION   Lisinopril    COUGH/ WHEEZING   Paxil [paroxetine Hcl]    INCREASED APPETITE FOR SWEETS   Zoloft [sertraline Hcl]    TINNITUS   Elemental Sulfur Rash   FEVER      Medication List    STOP taking these medications   irbesartan 150 MG tablet Commonly known as: AVAPRO   naproxen 500 MG tablet Commonly known as: NAPROSYN   Paxlovid 20 x 150 MG & 10 x 100MG  Tbpk Generic drug: Nirmatrelvir & Ritonavir     TAKE these medications   amoxicillin 500 MG capsule Commonly known as: AMOXIL Take 2 capsules (1,000 mg total) by mouth 3 (three) times daily for 7 days.   baclofen 10 MG tablet Commonly known as: LIORESAL Take 1 tablet by mouth daily as needed for muscle spasms.   Calcium-Vitamin D3 600-400 MG-UNIT Tabs Take 1 tablet by mouth daily.   chlorthalidone 25 MG tablet Commonly known as: HYGROTON Take 25 mg by mouth daily.   gabapentin 100 MG capsule Commonly known as: NEURONTIN Take 100 mg by mouth in the morning, at noon, and at bedtime.   metoprolol tartrate 50 MG tablet Commonly known as: LOPRESSOR Take 1 tablet (50 mg total) by mouth 2 (two) times daily.   MULTIVITAMIN PO Take 1 tablet by mouth daily.   naproxen sodium 220 MG tablet Commonly known as: ALEVE Take 220 mg by mouth 2 (two) times daily as needed (pain).   omeprazole 40 MG capsule Commonly known as: PRILOSEC Take 40 mg by mouth daily.   ondansetron 4 MG tablet Commonly known as: ZOFRAN Take 4 mg by mouth every 8 (eight) hours as needed for nausea.   oxyCODONE 5 MG immediate release tablet Commonly known as: Oxy IR/ROXICODONE Take 5 mg by mouth 4 (four) times daily as needed for moderate pain.   sennosides-docusate sodium 8.6-50 MG tablet Commonly known  as: SENOKOT-S Take 1 tablet by mouth as needed (when taking pain medication).   Venlafaxine HCl 75 MG Tb24 Take 37.5 mg by mouth daily.       Discharge Assessment: Vitals:   06/04/20 2025 06/05/20 0427  BP: 133/79 (!) 151/80  Pulse: 100 95  Resp: 20 18  Temp: 98 F (36.7 C) 98.5 F (36.9 C)  SpO2: 93% 94%   Skin clean, dry and intact without evidence of skin break down, no evidence of skin tears noted. IV catheter discontinued intact. Site without signs and symptoms of complications - no redness or edema noted at insertion site, patient denies c/o pain - only slight tenderness at site.  Dressing with slight pressure applied.  D/c Instructions-Education: Discharge instructions given to patient/family with verbalized understanding. D/c education completed with patient/family including follow up instructions, medication list, d/c activities limitations if indicated, with other d/c instructions as indicated by MD - patient able to verbalize understanding, all questions fully answered. Patient instructed to return to ED, call 911, or call MD for any changes in condition.  Patient escorted via [place], and D/C home via private auto.  Hiram Comber, RN 06/05/2020 2:27 PM

## 2020-06-05 NOTE — Progress Notes (Signed)
Physical Therapy Treatment Patient Details Name: [name] MRN: [id number] DOB: [birth year] Today's Date: 06/05/2020    History of Present Illness Pt is a 65 y.o. female who tested (+) COVID-19 on 05/23/20, now admitted 05/29/20 with SOB, cough, fever. Workup for acute hypoxic respiratory failure due to COVID-19, AKI, L side infiltrate with blood cultures (+) pneumococcus. Pt with pleural effusion; considering thoracentesis. PMH includes recent L partial TKR (04/29/20), HTN, Hep C, asthma, depression.   PT Comments    Pt progressing well with mobility, preparing for d/c home today. Ambulation distance limited by fatigue; pt able to perform stair training with SPC and min guard. Reviewed educ re: therex/ROM (handout provided), activity recommendations, IS/flutter valve use, importance of mobility. Pt reports no further questions or concerns.    Follow Up Recommendations  Outpatient PT (resume OPPT)     Equipment Recommendations  None recommended by PT    Recommendations for Other Services       Precautions / Restrictions Precautions Precautions: Fall;Knee Precaution Comments: Recent partial total knee replacement 04/29/20 Restrictions Weight Bearing Restrictions: No    Mobility  Bed Mobility               General bed mobility comments: Seated in recliner    Transfers Overall transfer level: Independent Equipment used: None;Straight cane Transfers: Sit to/from Stand              Ambulation/Gait Ambulation/Gait assistance: Modified independent (Device/Increase time) Gait Distance (Feet): 100 Feet Assistive device: Straight cane Gait Pattern/deviations: Step-through pattern;Decreased stride length Gait velocity: Decreased   General Gait Details: Slow, steady gait mod indep with SPC, intermittent cues for sequencing with cane; further distance limited by fatigue, pt reports, "Getting that bath wore me out"   Stairs Stairs: Yes Stairs assistance: Min  guard Stair Management: No rails;Step to pattern;Forwards;With cane Number of Stairs: 2 General stair comments: Ascend/descend 1 step with SPC and min guard for HHA, performed 2x trials; cues for sequencing; educ to have pt's daughter guard with HHA since no rail on step into home   Wheelchair Mobility    Modified Rankin (Stroke Patients Only)       Balance Overall balance assessment: Needs assistance Sitting-balance support: No upper extremity supported;Feet supported Sitting balance-Leahy Scale: Good       Standing balance-Leahy Scale: Fair Standing balance comment: Stability improved with SPC use                            Cognition Arousal/Alertness: Awake/alert Behavior During Therapy: WFL for tasks assessed/performed Overall Cognitive Status: Within Functional Limits for tasks assessed                                        Exercises Other Exercises Other Exercises: Pt remains discouraged she is still pulling <238mL on IS - IS replaced with new one and pt now able to pull ~500 mL - performed IS x8 - had pt pack IS/FV to take home and continue using another 1-2 weeks, reviewed dosage Other Exercises: [employer] HEP handout (Access Code [email or web address]) provided and reviewed - partial squats with UE support, heel raises with UE support, heel slides with strap (pt reports having strap at home)    General Comments General comments (skin integrity, edema, etc.): Discussed why pt pursuing HHPT services instead of resuming OP PT - pt reports daughter worried about  pt's current condition and wants to start with HHPT due to this and no transportation available      Pertinent Vitals/Pain Pain Assessment: Faces Faces Pain Scale: Hurts a little bit Pain Location: abdomen Pain Descriptors / Indicators: Sore Pain Intervention(s): Monitored during session    Home Living                      Prior Function            PT Goals (current goals can  now be found in the care plan section) Progress towards PT goals: Progressing toward goals    Frequency    Min 3X/week      PT Plan Current plan remains appropriate    Co-evaluation              AM-PAC PT "6 Clicks" Mobility   Outcome Measure  Help needed turning from your back to your side while in a flat bed without using bedrails?: None Help needed moving from lying on your back to sitting on the side of a flat bed without using bedrails?: None Help needed moving to and from a bed to a chair (including a wheelchair)?: None Help needed standing up from a chair using your arms (e.g., wheelchair or bedside chair)?: None Help needed to walk in hospital room?: None Help needed climbing 3-5 steps with a railing? : A Little 6 Click Score: 23    End of Session   Activity Tolerance: Patient tolerated treatment well Patient left: in chair;with call bell/phone within reach Nurse Communication: Mobility status PT Visit Diagnosis: Other abnormalities of gait and mobility (R26.89);Difficulty in walking, not elsewhere classified (R26.2)     Time: 0762-2633 PT Time Calculation (min) (ACUTE ONLY): 20 min  Charges:  $Gait Training: 8-22 mins                     Mabeline Caras, PT, DPT Acute Rehabilitation Services  Pager [phone] Office [place] 06/05/2020, 4:21 PM

## 2020-06-05 NOTE — Discharge Summary (Signed)
PATIENT DETAILS Name: Michaline Kindig Age: 65 y.o. Sex: female Date of Birth: [birth year] MRN: [id number]. Admitting Physician: Charlann Lange, MD DPO:[id number], [name], MD  Admit Date: 05/29/2020 Discharge date: 06/05/2020  Recommendations for Outpatient Follow-up:  1. Follow up with PCP in 1-2 weeks 2. Please obtain CMP/CBC in one week 3. Please ensure follow-up with pulmonology-with repeat imaging in the next 7 to 10 days. 4. Switched from Avapro to metoprolol-see below regarding tachycardia.  Admitted From:  Home  Disposition: Home with home health services   [place]: Yes  Equipment/Devices: None  Discharge Condition: Stable  CODE STATUS: FULL CODE  Diet recommendation:  Diet Order            Diet - low sodium heart healthy           Diet regular Room service appropriate? Yes; Fluid consistency: Thin  Diet effective now                  Brief Narrative: Patient is a 65 y.o. female with PMHx of HTN, hepatitis C, asthma, depression-who tested positive for COVID-19 on 1/30 (Home test)-after multiple family exposures-presented to the hospital with fevers, chills, cough, diarrhea and shortness of breath-she was found to have AKI, left-sided infiltrate-subsequent blood cultures positive for pneumococcus.  See below for further details.   COVID-19 vaccinated status: Unvaccinated  Significant Events: 2/5>> Admit to [facility] for AKI, PNA-blood cultures positive for pneumococcus 2/8>> left-sided pleuritic chest pain/2 L oxygen requirement/tachycardic  Significant studies: 2/5>>Chest x-ray: Left lower pneumonia, bilateral hilar fullness 2/6>> bilateral lower extremity Doppler: No DVT 2/8>> CTA chest: No PE, dense LLE-small parapneumonic effusion 2/8>>Echo: EF 60-65%, no wall motion abnormalities, grade 1 diastolic dysfunction  [id number] medications: Paxlovid:2/2>>2/8  Antibiotics: Rocephin: 2/6>>  Microbiology data: 2/5 >>blood culture: Streptococcus  pneumonae 2/8>> blood culture: No growth  Procedures: 2/11>> attempted thoracocentesis-dry tap  Consults: PCCM  Brief Hospital Course: Sepsis due to lobar pneumonia with pneumococcal bacteremia: Sepsis physiology has resolved-tachycardia better with metoprolol-continues to have left-sided pleuritic chest pain and persistent leukocytosis. Given dense left lower lobe infiltrate-bacteremia-concerned that she may be developing an empyema.  PCCM was consulted on 2/11 for thoracocentesis-however tap was dry.  Overall she has improved-suspect she is stable to be transitioned to oral antimicrobial therapy for one additional week (curbsided ID Dr Myrtice Lauth follow closely with PCP/pulmonology in around 7 to 10 days with repeat imaging. Both patient and patient's daughter are aware-that if she develops worsening fever/worsening chest pain-the need to seek immediate medical attention to rule out an empyema.  Left-sided pleuritic chest pain: Likely due to LL PNA-no PE on CTA chest.  Could also possibly be from musculoskeletal strain from coughing.  Supportive care.   AKI: Hemodynamically mediated due to sepsis/bacteremia-resolved.  Lower extremity edema: Probably due to hypoalbuminemia and diastolic CHF.    Slowly improving- continue chlorthalidone in the outpatient setting.  Follow with PCP.    HTN: Resume chlorthalidone-switch losartan to metoprolol (tachycardic).  Echo with preserved EF.  Sinus tachycardia: Improved-suspect physiological response to infection/left pleuritic pain.  If it persists-PCP to consider checking TSH (if not done recently).  Hypomagnesemia: Repleted  Hypokalemia: Repleted prior to discharge-follow with PCP.  COVID-19 infection: Suspect that main issues are from pneumococcal bacteremia/PNA-has finished a course of Paxlovid.  CRP significantly elevated-but downtrending.  She remains asymptomatic from COVID-19 infection.  She was encouraged to get vaccinated-in  approximately 1 month.  COVID-19 Labs:  Recent Labs    06/03/20 0106 06/04/20 0159 06/05/20 0086  DDIMER 2.35* 3.29* 4.02*  CRP 30.8* 23.1* 23.0*    Lab Results  Component Value Date   SARSCOV2NAA POSITIVE (A) 05/29/2020   [place] Not Detected 07/28/2019   [place] Not Detected 03/24/2019   SARSCOV2NAA Detected (A) 03/07/2019    Obesity: Estimated body mass index is 36.56 kg/m as calculated from the following:   Height as of this encounter: 5\' 4"  (1.626 m).   Weight as of this encounter: 96.6 kg.     Discharge Diagnoses:  Active Problems:   HTN (hypertension)   Depression   Asthma   ARF (acute renal failure) (HCC)   Pneumonia due to COVID-19 virus   Hypokalemia   Hyponatremia   Lactic acidosis   Hypomagnesemia   Elevated d-dimer   Discharge Instructions:    Person Under Monitoring Name: Josslyn Ciolek  Location: [address] [place] [address]   Infection Prevention Recommendations for Individuals Confirmed to have, or Being Evaluated for, 2019 Novel Coronavirus (COVID-19) Infection Who Receive Care at Home  Individuals who are confirmed to have, or are being evaluated for, COVID-19 should follow the prevention steps below until a healthcare provider or local or state health department says they can return to normal activities.  Stay home except to get medical care You should restrict activities outside your home, except for getting medical care. Do not go to work, school, or public areas, and do not use public transportation or taxis.  Call ahead before visiting your doctor Before your medical appointment, call the healthcare provider and tell them that you have, or are being evaluated for, COVID-19 infection. This will help the healthcare provider's office take steps to keep other people from getting infected. Ask your healthcare provider to call the local or state health department.  Monitor your symptoms Seek prompt medical  attention if your illness is worsening (e.g., difficulty breathing). Before going to your medical appointment, call the healthcare provider and tell them that you have, or are being evaluated for, COVID-19 infection. Ask your healthcare provider to call the local or state health department.  Wear a facemask You should wear a facemask that covers your nose and mouth when you are in the same room with other people and when you visit a healthcare provider. People who live with or visit you should also wear a facemask while they are in the same room with you.  Separate yourself from other people in your home As much as possible, you should stay in a different room from other people in your home. Also, you should use a separate bathroom, if available.  Avoid sharing household items You should not share dishes, drinking glasses, cups, eating utensils, towels, bedding, or other items with other people in your home. After using these items, you should wash them thoroughly with soap and water.  Cover your coughs and sneezes Cover your mouth and nose with a tissue when you cough or sneeze, or you can cough or sneeze into your sleeve. Throw used tissues in a lined trash can, and immediately wash your hands with soap and water for at least 20 seconds or use an alcohol-based hand rub.  Wash your [facility] your hands often and thoroughly with soap and water for at least 20 seconds. You can use an alcohol-based hand sanitizer if soap and water are not available and if your hands are not visibly dirty. Avoid touching your eyes, nose, and mouth with unwashed hands.   Prevention Steps for Caregivers and Household Members of Individuals Confirmed  to have, or Being Evaluated for, COVID-19 Infection Being Cared for in the Home  If you live with, or provide care at home for, a person confirmed to have, or being evaluated for, COVID-19 infection please follow these guidelines to prevent  infection:  Follow healthcare provider's instructions Make sure that you understand and can help the patient follow any healthcare provider instructions for all care.  Provide for the patient's basic needs You should help the patient with basic needs in the home and provide support for getting groceries, prescriptions, and other personal needs.  Monitor the patient's symptoms If they are getting sicker, call his or her medical provider and tell them that the patient has, or is being evaluated for, COVID-19 infection. This will help the healthcare provider's office take steps to keep other people from getting infected. Ask the healthcare provider to call the local or state health department.  Limit the number of people who have contact with the patient  If possible, have only one caregiver for the patient.  Other household members should stay in another home or place of residence. If this is not possible, they should stay  in another room, or be separated from the patient as much as possible. Use a separate bathroom, if available.  Restrict visitors who do not have an essential need to be in the home.  Keep older adults, very young children, and other sick people away from the patient Keep older adults, very young children, and those who have compromised immune systems or chronic health conditions away from the patient. This includes people with chronic heart, lung, or kidney conditions, diabetes, and cancer.  Ensure good ventilation Make sure that shared spaces in the home have good air flow, such as from an air conditioner or an opened window, weather permitting.  Wash your hands often  Wash your hands often and thoroughly with soap and water for at least 20 seconds. You can use an alcohol based hand sanitizer if soap and water are not available and if your hands are not visibly dirty.  Avoid touching your eyes, nose, and mouth with unwashed hands.  Use disposable paper towels  to dry your hands. If not available, use dedicated cloth towels and replace them when they become wet.  Wear a facemask and gloves  Wear a disposable facemask at all times in the room and gloves when you touch or have contact with the patient's blood, body fluids, and/or secretions or excretions, such as sweat, saliva, sputum, nasal mucus, vomit, urine, or feces.  Ensure the mask fits over your nose and mouth tightly, and do not touch it during use.  Throw out disposable facemasks and gloves after using them. Do not reuse.  Wash your hands immediately after removing your facemask and gloves.  If your personal clothing becomes contaminated, carefully remove clothing and launder. Wash your hands after handling contaminated clothing.  Place all used disposable facemasks, gloves, and other waste in a lined container before disposing them with other household waste.  Remove gloves and wash your hands immediately after handling these items.  Do not share dishes, glasses, or other household items with the patient  Avoid sharing household items. You should not share dishes, drinking glasses, cups, eating utensils, towels, bedding, or other items with a patient who is confirmed to have, or being evaluated for, COVID-19 infection.  After the person uses these items, you should wash them thoroughly with soap and water.  Wash laundry thoroughly  Immediately remove and wash  clothes or bedding that have blood, body fluids, and/or secretions or excretions, such as sweat, saliva, sputum, nasal mucus, vomit, urine, or feces, on them.  Wear gloves when handling laundry from the patient.  Read and follow directions on labels of laundry or clothing items and detergent. In general, wash and dry with the warmest temperatures recommended on the label.  Clean all areas the individual has used often  Clean all touchable surfaces, such as counters, tabletops, doorknobs, bathroom fixtures, toilets, phones,  keyboards, tablets, and bedside tables, every day. Also, clean any surfaces that may have blood, body fluids, and/or secretions or excretions on them.  Wear gloves when cleaning surfaces the patient has come in contact with.  Use a diluted bleach solution (e.g., dilute bleach with 1 part bleach and 10 parts water) or a household disinfectant with a label that says EPA-registered for coronaviruses. To make a bleach solution at home, add 1 tablespoon of bleach to 1 quart (4 cups) of water. For a larger supply, add  cup of bleach to 1 gallon (16 cups) of water.  Read labels of cleaning products and follow recommendations provided on product labels. Labels contain instructions for safe and effective use of the cleaning product including precautions you should take when applying the product, such as wearing gloves or eye protection and making sure you have good ventilation during use of the product.  Remove gloves and wash hands immediately after cleaning.  Monitor yourself for signs and symptoms of illness Caregivers and household members are considered close contacts, should monitor their health, and will be asked to limit movement outside of the home to the extent possible. Follow the monitoring steps for close contacts listed on the symptom monitoring form.   ? If you have additional questions, contact your local health department or call the epidemiologist on call at [phone] (available 24/7). ? This guidance is subject to change. For the most up-to-date guidance from CDC, please refer to their website: YouBlogs.pl    Activity:  As tolerated   Discharge Instructions    Call MD for:  difficulty breathing, headache or visual disturbances   Complete by: As directed    Call MD for:  severe uncontrolled pain   Complete by: As directed    Call MD for:  temperature >100.4   Complete by: As directed    Diet - low sodium heart  healthy   Complete by: As directed    Increase activity slowly   Complete by: As directed      Allergies as of 06/05/2020      Reactions   Atenolol    FATIGUE/ HYPOTENSION   Lisinopril    COUGH/ WHEEZING   Paxil [paroxetine Hcl]    INCREASED APPETITE FOR SWEETS   Zoloft [sertraline Hcl]    TINNITUS   Elemental Sulfur Rash   FEVER      Medication List    STOP taking these medications   irbesartan 150 MG tablet Commonly known as: AVAPRO   naproxen 500 MG tablet Commonly known as: NAPROSYN   Paxlovid 20 x 150 MG & 10 x 100MG  Tbpk Generic drug: Nirmatrelvir & Ritonavir     TAKE these medications   amoxicillin 500 MG capsule Commonly known as: AMOXIL Take 2 capsules (1,000 mg total) by mouth 3 (three) times daily for 7 days.   baclofen 10 MG tablet Commonly known as: LIORESAL Take 1 tablet by mouth daily as needed for muscle spasms.   Calcium-Vitamin D3 600-400 MG-UNIT Tabs Take  1 tablet by mouth daily.   chlorthalidone 25 MG tablet Commonly known as: HYGROTON Take 25 mg by mouth daily.   gabapentin 100 MG capsule Commonly known as: NEURONTIN Take 100 mg by mouth in the morning, at noon, and at bedtime.   metoprolol tartrate 50 MG tablet Commonly known as: LOPRESSOR Take 1 tablet (50 mg total) by mouth 2 (two) times daily.   MULTIVITAMIN PO Take 1 tablet by mouth daily.   naproxen sodium 220 MG tablet Commonly known as: ALEVE Take 220 mg by mouth 2 (two) times daily as needed (pain).   omeprazole 40 MG capsule Commonly known as: PRILOSEC Take 40 mg by mouth daily.   ondansetron 4 MG tablet Commonly known as: ZOFRAN Take 4 mg by mouth every 8 (eight) hours as needed for nausea.   oxyCODONE 5 MG immediate release tablet Commonly known as: Oxy IR/ROXICODONE Take 5 mg by mouth 4 (four) times daily as needed for moderate pain.   sennosides-docusate sodium 8.6-50 MG tablet Commonly known as: SENOKOT-S Take 1 tablet by mouth as needed (when taking pain  medication).   Venlafaxine HCl 75 MG Tb24 Take 37.5 mg by mouth daily.       Follow-up Information    [facility], [facility] Follow up.   Specialty: Home Health Services Why: home health  PT will be provided by [facility] Contact information: [address] [place] [address] [phone]              Allergies  Allergen Reactions  . Atenolol     FATIGUE/ HYPOTENSION  . Lisinopril     COUGH/ WHEEZING  . Paxil [Paroxetine Hcl]     INCREASED APPETITE FOR SWEETS  . Zoloft [Sertraline Hcl]     TINNITUS  . Elemental Sulfur Rash    FEVER    Other Procedures/Studies: CT ANGIO CHEST PE W OR WO CONTRAST  Result Date: 06/01/2020 CLINICAL DATA:  COVID pneumonia, left lower lobe consolidation, dyspnea EXAM: CT ANGIOGRAPHY CHEST WITH CONTRAST TECHNIQUE: Multidetector CT imaging of the chest was performed using the standard protocol during bolus administration of intravenous contrast. Multiplanar CT image reconstructions and MIPs were obtained to evaluate the vascular anatomy. CONTRAST:  72mL OMNIPAQUE IOHEXOL 350 MG/ML SOLN COMPARISON:  CT coronary calcium 11/11/2015 FINDINGS: Cardiovascular: Satisfactory opacification of the pulmonary arteries to the segmental level. No evidence of pulmonary embolism. Normal heart size. No pericardial effusion. The central pulmonary arteries are of normal caliber. The thoracic aorta is unremarkable. Mediastinum/Nodes: No enlarged mediastinal, hilar, or axillary lymph nodes. Thyroid gland, trachea, and esophagus demonstrate no significant findings. Lungs/Pleura: There is dense left lower lobe consolidation in keeping with changes of acute lobar pneumonia in the appropriate clinical setting. There is endobronchial debris resulting in impaction of a the bronchi of the left lower lobe. Mild scattered infiltrate is seen within the basilar lingula and right upper lobe, likely infectious or inflammatory and potentially related to  endobronchial spread of disease. A small amount of debris is noted within the right lower lobe bronchus. Small left pleural effusion is present. No pneumothorax. Upper Abdomen: At least moderate hepatic steatosis is noted. Musculoskeletal: No acute bone abnormality. Review of the MIP images confirms the above findings. IMPRESSION: Dense consolidation and airway impaction involving the left lower lobe compatible with changes of acute lobar pneumonia in the appropriate clinical setting. Small parapneumonic effusion noted. Scattered infiltrate within the right upper lobe and basilar lingula as well as endobronchial debris within the left  lower lobe possibly representing endobronchial spread of disease. Aspiration could also appear in this fashion, though its asymmetry involving the left lower lobe makes this less likely. No pulmonary embolism Electronically Signed   By: Fidela Salisbury MD   On: 06/01/2020 11:34   DG CHEST PORT 1 VIEW  Result Date: 06/04/2020 CLINICAL DATA:  Status post left-sided thoracentesis. EXAM: PORTABLE CHEST 1 VIEW COMPARISON:  June 04, 2020 (4:41 a.m.) FINDINGS: Stable, marked severity infiltrate is seen within the left lower lobe. A stable moderate size left-sided pleural effusion is seen. No pneumothorax is identified. The heart size and mediastinal contours are within normal limits. The visualized skeletal structures are unremarkable. IMPRESSION: 1. Stable left lower lobe infiltrate and moderate size left-sided pleural effusion. No pneumothorax. 2. No pneumothorax, status post thoracentesis. Electronically Signed   By: Virgina Norfolk M.D.   On: 06/04/2020 18:44   DG Chest Portable 1 View  Result Date: 05/29/2020 CLINICAL DATA:  COVID positive.  Shortness of breath.  Weakness. EXAM: PORTABLE CHEST 1 VIEW COMPARISON:  Two-view chest x-ray 01/13/2015 FINDINGS: Heart size is normal. Hilar fullness present bilaterally. Asymmetric left lower lobe airspace opacities are present. Upper  lung fields are clear. IMPRESSION: 1. Asymmetric left lower lobe airspace disease compatible with pneumonia. 2. Bilateral hilar fullness. Underlying adenopathy is not excluded. Electronically Signed   By: San Morelle M.D.   On: 05/29/2020 11:35   DG Chest Port 1V same Day  Result Date: 06/04/2020 CLINICAL DATA:  65 year old female COVID-19. Left lower lobe pneumonia. EXAM: PORTABLE CHEST 1 VIEW COMPARISON:  Chest CT 06/01/2020 and earlier. FINDINGS: Portable AP upright view at 0441 hours. Mildly improved lung volumes from yesterday. Ongoing dense left lower lobe consolidation. Lingula also affected on chest CTA yesterday which demonstrated trace pleural fluid. Stable cardiac size and mediastinal contours. Right lung remains clear. Visualized tracheal air column is within normal limits. Paucity of bowel gas. Stable visualized osseous structures. IMPRESSION: Stable left lung base pneumonia.  Mildly improved lung volumes. Electronically Signed   By: Genevie Ann M.D.   On: 06/04/2020 07:10   DG Chest Port 1V same Day  Result Date: 05/31/2020 CLINICAL DATA:  COVID, shortness of breath EXAM: PORTABLE CHEST 1 VIEW COMPARISON:  05/29/2020 FINDINGS: Heart is normal size. Airspace opacity noted in the left lower lobe. Small left pleural effusion. No confluent opacity or effusion on the right. No acute bony abnormality. IMPRESSION: Left lower lobe pneumonia.  Small left effusion. Electronically Signed   By: Rolm Baptise M.D.   On: 05/31/2020 20:23   VAS [place] LOWER EXTREMITY VENOUS (DVT)  Result Date: 05/30/2020  Lower Venous DVT Study Indications: Covid.  Risk Factors: Surgery Patient reports partial LT knee replacement in January. Anticoagulation: Lovenox. Comparison       11-05-2015 Bilateral lower extremity venous was negative for Study:           DVT. Performing Technologist: Darlin Coco RDMS  Examination Guidelines: A complete evaluation includes B-mode imaging, spectral Doppler, color Doppler, and power  Doppler as needed of all accessible portions of each vessel. Bilateral testing is considered an integral part of a complete examination. Limited examinations for reoccurring indications may be performed as noted. The reflux portion of the exam is performed with the patient in reverse Trendelenburg.  +---------+---------------+---------+-----------+----------+--------------+ RIGHT    CompressibilityPhasicitySpontaneityPropertiesThrombus Aging +---------+---------------+---------+-----------+----------+--------------+ CFV      Full           Yes      Yes                                 +---------+---------------+---------+-----------+----------+--------------+  SFJ      Full                                                        +---------+---------------+---------+-----------+----------+--------------+ FV Prox  Full                                                        +---------+---------------+---------+-----------+----------+--------------+ FV Mid   Full                                                        +---------+---------------+---------+-----------+----------+--------------+ FV DistalFull                                                        +---------+---------------+---------+-----------+----------+--------------+ PFV      Full                                                        +---------+---------------+---------+-----------+----------+--------------+ POP      Full           Yes      Yes                                 +---------+---------------+---------+-----------+----------+--------------+ PTV      Full                                                        +---------+---------------+---------+-----------+----------+--------------+ PERO     Full                                                        +---------+---------------+---------+-----------+----------+--------------+    +---------+---------------+---------+-----------+----------+--------------+ LEFT     CompressibilityPhasicitySpontaneityPropertiesThrombus Aging +---------+---------------+---------+-----------+----------+--------------+ CFV      Full           Yes      Yes                                 +---------+---------------+---------+-----------+----------+--------------+ SFJ      Full                                                        +---------+---------------+---------+-----------+----------+--------------+  FV Prox  Full                                                        +---------+---------------+---------+-----------+----------+--------------+ FV Mid   Full                                                        +---------+---------------+---------+-----------+----------+--------------+ FV DistalFull                                                        +---------+---------------+---------+-----------+----------+--------------+ PFV      Full                                                        +---------+---------------+---------+-----------+----------+--------------+ POP      Full           Yes      Yes                                 +---------+---------------+---------+-----------+----------+--------------+ PTV      Full                                                        +---------+---------------+---------+-----------+----------+--------------+ PERO     Full                                                        +---------+---------------+---------+-----------+----------+--------------+    Summary: RIGHT: - There is no evidence of deep vein thrombosis in the lower extremity.  - No cystic structure found in the popliteal fossa.  LEFT: - There is no evidence of deep vein thrombosis in the lower extremity.  - No cystic structure found in the popliteal fossa.  *See table(s) above for measurements and observations. Electronically signed  by [name] Martinez MD on 05/30/2020 at 2:31:02 PM.    Final    ECHOCARDIOGRAM LIMITED  Result Date: 06/01/2020    ECHOCARDIOGRAM LIMITED REPORT   Patient Name:   CHAUNCEY Barrette Date of Exam: 06/01/2020 Medical Rec #:  [id number]      Height:       64.0 in Accession #:    [id number]     Weight:       213.0 lb Date of Birth:  1956/03/08      BSA:          2.009 m Patient Age:    65 years  BP:           132/85 mmHg Patient Gender: F              HR:           100 bpm. Exam Location:  Inpatient Procedure: Limited Echo, Limited Color Doppler, Cardiac Doppler and Intracardiac            Opacification Agent Indications:    Bacteremia R78.81  History:        Patient has prior history of Echocardiogram examinations, most                 recent 11/11/2015. Signs/Symptoms:Shortness of Breath and Fever;                 Risk Factors:Hypertension. COVID 19 pneumonia. Hepatitis C.                 Acute kidney injury.  Sonographer:    Darlina Sicilian RDCS Referring Phys: [place]  1. Left ventricular ejection fraction, by estimation, is 60 to 65%. The left ventricle has normal function. The left ventricle has no regional wall motion abnormalities. Left ventricular diastolic parameters are consistent with Grade I diastolic dysfunction (impaired relaxation). Elevated left atrial pressure.  2. Right ventricular systolic function is normal. The right ventricular size is normal.  3. The mitral valve is normal in structure. No evidence of mitral valve regurgitation. No evidence of mitral stenosis.  4. The aortic valve is tricuspid. Aortic valve regurgitation is not visualized. Mild aortic valve sclerosis is present, with no evidence of aortic valve stenosis. FINDINGS  Left Ventricle: Left ventricular ejection fraction, by estimation, is 60 to 65%. The left ventricle has normal function. The left ventricle has no regional wall motion abnormalities. Definity contrast agent was given IV to delineate the left  ventricular  endocardial borders. The left ventricular internal cavity size was normal in size. There is no left ventricular hypertrophy. Left ventricular diastolic parameters are consistent with Grade I diastolic dysfunction (impaired relaxation). Elevated left atrial pressure. Right Ventricle: The right ventricular size is normal.Right ventricular systolic function is normal. Left Atrium: Left atrial size was normal in size. Right Atrium: Right atrial size was normal in size. Pericardium: There is no evidence of pericardial effusion. Mitral Valve: The mitral valve is normal in structure. No evidence of mitral valve stenosis. Tricuspid Valve: The tricuspid valve is normal in structure. Tricuspid valve regurgitation is trivial. No evidence of tricuspid stenosis. Aortic Valve: The aortic valve is tricuspid. Aortic valve regurgitation is not visualized. Mild aortic valve sclerosis is present, with no evidence of aortic valve stenosis. Pulmonic Valve: The pulmonic valve was not well visualized. Pulmonic valve regurgitation is not visualized. No evidence of pulmonic stenosis. Aorta: The aortic root is normal in size and structure.  LEFT VENTRICLE PLAX 2D LVIDd:         4.25 cm     Diastology LVIDs:         3.10 cm     LV e' medial:    7.07 cm/s LV PW:         0.80 cm     LV E/e' medial:  17.3 LV IVS:        0.95 cm     LV e' lateral:   7.07 cm/s                            LV E/e' lateral: 17.3  LV Volumes (MOD) LV vol d, MOD A2C: 66.8 ml LV vol d, MOD A4C: 77.7 ml LV vol s, MOD A2C: 29.6 ml LV vol s, MOD A4C: 35.3 ml LV SV MOD A2C:     37.2 ml LV SV MOD A4C:     77.7 ml LV SV MOD BP:      38.9 ml LEFT ATRIUM         Index LA diam:    2.90 cm 1.44 cm/m  AORTIC VALVE LVOT Vmax:   140.00 cm/s LVOT Vmean:  110.000 cm/s LVOT VTI:    0.201 m MITRAL VALVE MV Area (PHT): 3.72 cm     SHUNTS MV Decel Time: 204 msec     Systemic VTI: 0.20 m MV E velocity: 122.00 cm/s MV A velocity: 125.00 cm/s MV E/A ratio:  0.98 Kirk Ruths  MD Electronically signed by Kirk Ruths MD Signature Date/Time: 06/01/2020/12:17:19 PM    Final      TODAY-DAY OF DISCHARGE:  Subjective:   [name] today has no headache,no chest abdominal pain,no new weakness tingling or numbness, feels much better wants to go home today.   Objective:   Blood pressure (!) 151/80, pulse 95, temperature 98.5 F (36.9 C), temperature source Oral, resp. rate 18, height 5\' 4"  (1.626 m), weight 96.6 kg, SpO2 94 %.  Intake/Output Summary (Last 24 hours) at 06/05/2020 1207 Last data filed at 06/05/2020 0428 Gross per 24 hour  Intake 240 ml  Output 4 ml  Net 236 ml   Filed Weights   05/29/20 1020  Weight: 96.6 kg    Exam: Awake Alert, Oriented *3, No new F.N deficits, Normal affect [place].AT,PERRAL Supple Neck,No JVD, No cervical lymphadenopathy appriciated.  Symmetrical Chest wall movement, Good air movement bilaterally, CTAB RRR,No Gallops,Rubs or new Murmurs, No Parasternal Heave +ve B.Sounds, Abd Soft, Non tender, No organomegaly appriciated, No rebound -guarding or rigidity. No Cyanosis, Clubbing or edema, No new Rash or bruise   PERTINENT RADIOLOGIC STUDIES: CT ANGIO CHEST PE W OR WO CONTRAST  Result Date: 06/01/2020 CLINICAL DATA:  COVID pneumonia, left lower lobe consolidation, dyspnea EXAM: CT ANGIOGRAPHY CHEST WITH CONTRAST TECHNIQUE: Multidetector CT imaging of the chest was performed using the standard protocol during bolus administration of intravenous contrast. Multiplanar CT image reconstructions and MIPs were obtained to evaluate the vascular anatomy. CONTRAST:  45mL OMNIPAQUE IOHEXOL 350 MG/ML SOLN COMPARISON:  CT coronary calcium 11/11/2015 FINDINGS: Cardiovascular: Satisfactory opacification of the pulmonary arteries to the segmental level. No evidence of pulmonary embolism. Normal heart size. No pericardial effusion. The central pulmonary arteries are of normal caliber. The thoracic aorta is unremarkable. Mediastinum/Nodes: No  enlarged mediastinal, hilar, or axillary lymph nodes. Thyroid gland, trachea, and esophagus demonstrate no significant findings. Lungs/Pleura: There is dense left lower lobe consolidation in keeping with changes of acute lobar pneumonia in the appropriate clinical setting. There is endobronchial debris resulting in impaction of a the bronchi of the left lower lobe. Mild scattered infiltrate is seen within the basilar lingula and right upper lobe, likely infectious or inflammatory and potentially related to endobronchial spread of disease. A small amount of debris is noted within the right lower lobe bronchus. Small left pleural effusion is present. No pneumothorax. Upper Abdomen: At least moderate hepatic steatosis is noted. Musculoskeletal: No acute bone abnormality. Review of the MIP images confirms the above findings. IMPRESSION: Dense consolidation and airway impaction involving the left lower lobe compatible with changes of acute lobar pneumonia in the appropriate clinical setting. Small parapneumonic  effusion noted. Scattered infiltrate within the right upper lobe and basilar lingula as well as endobronchial debris within the left lower lobe possibly representing endobronchial spread of disease. Aspiration could also appear in this fashion, though its asymmetry involving the left lower lobe makes this less likely. No pulmonary embolism Electronically Signed   By: Fidela Salisbury MD   On: 06/01/2020 11:34   DG CHEST PORT 1 VIEW  Result Date: 06/04/2020 CLINICAL DATA:  Status post left-sided thoracentesis. EXAM: PORTABLE CHEST 1 VIEW COMPARISON:  June 04, 2020 (4:41 a.m.) FINDINGS: Stable, marked severity infiltrate is seen within the left lower lobe. A stable moderate size left-sided pleural effusion is seen. No pneumothorax is identified. The heart size and mediastinal contours are within normal limits. The visualized skeletal structures are unremarkable. IMPRESSION: 1. Stable left lower lobe infiltrate  and moderate size left-sided pleural effusion. No pneumothorax. 2. No pneumothorax, status post thoracentesis. Electronically Signed   By: Virgina Norfolk M.D.   On: 06/04/2020 18:44   DG Chest Portable 1 View  Result Date: 05/29/2020 CLINICAL DATA:  COVID positive.  Shortness of breath.  Weakness. EXAM: PORTABLE CHEST 1 VIEW COMPARISON:  Two-view chest x-ray 01/13/2015 FINDINGS: Heart size is normal. Hilar fullness present bilaterally. Asymmetric left lower lobe airspace opacities are present. Upper lung fields are clear. IMPRESSION: 1. Asymmetric left lower lobe airspace disease compatible with pneumonia. 2. Bilateral hilar fullness. Underlying adenopathy is not excluded. Electronically Signed   By: San Morelle M.D.   On: 05/29/2020 11:35   DG Chest Port 1V same Day  Result Date: 06/04/2020 CLINICAL DATA:  65 year old female COVID-19. Left lower lobe pneumonia. EXAM: PORTABLE CHEST 1 VIEW COMPARISON:  Chest CT 06/01/2020 and earlier. FINDINGS: Portable AP upright view at 0441 hours. Mildly improved lung volumes from yesterday. Ongoing dense left lower lobe consolidation. Lingula also affected on chest CTA yesterday which demonstrated trace pleural fluid. Stable cardiac size and mediastinal contours. Right lung remains clear. Visualized tracheal air column is within normal limits. Paucity of bowel gas. Stable visualized osseous structures. IMPRESSION: Stable left lung base pneumonia.  Mildly improved lung volumes. Electronically Signed   By: Genevie Ann M.D.   On: 06/04/2020 07:10   DG Chest Port 1V same Day  Result Date: 05/31/2020 CLINICAL DATA:  COVID, shortness of breath EXAM: PORTABLE CHEST 1 VIEW COMPARISON:  05/29/2020 FINDINGS: Heart is normal size. Airspace opacity noted in the left lower lobe. Small left pleural effusion. No confluent opacity or effusion on the right. No acute bony abnormality. IMPRESSION: Left lower lobe pneumonia.  Small left effusion. Electronically Signed   By: Rolm Baptise M.D.   On: 05/31/2020 20:23   VAS [place] LOWER EXTREMITY VENOUS (DVT)  Result Date: 05/30/2020  Lower Venous DVT Study Indications: Covid.  Risk Factors: Surgery Patient reports partial LT knee replacement in January. Anticoagulation: Lovenox. Comparison       11-05-2015 Bilateral lower extremity venous was negative for Study:           DVT. Performing Technologist: Darlin Coco RDMS  Examination Guidelines: A complete evaluation includes B-mode imaging, spectral Doppler, color Doppler, and power Doppler as needed of all accessible portions of each vessel. Bilateral testing is considered an integral part of a complete examination. Limited examinations for reoccurring indications may be performed as noted. The reflux portion of the exam is performed with the patient in reverse Trendelenburg.  +---------+---------------+---------+-----------+----------+--------------+ RIGHT    CompressibilityPhasicitySpontaneityPropertiesThrombus Aging +---------+---------------+---------+-----------+----------+--------------+ CFV      Full  Yes      Yes                                 +---------+---------------+---------+-----------+----------+--------------+ SFJ      Full                                                        +---------+---------------+---------+-----------+----------+--------------+ FV Prox  Full                                                        +---------+---------------+---------+-----------+----------+--------------+ FV Mid   Full                                                        +---------+---------------+---------+-----------+----------+--------------+ FV DistalFull                                                        +---------+---------------+---------+-----------+----------+--------------+ PFV      Full                                                        +---------+---------------+---------+-----------+----------+--------------+  POP      Full           Yes      Yes                                 +---------+---------------+---------+-----------+----------+--------------+ PTV      Full                                                        +---------+---------------+---------+-----------+----------+--------------+ PERO     Full                                                        +---------+---------------+---------+-----------+----------+--------------+   +---------+---------------+---------+-----------+----------+--------------+ LEFT     CompressibilityPhasicitySpontaneityPropertiesThrombus Aging +---------+---------------+---------+-----------+----------+--------------+ CFV      Full           Yes      Yes                                 +---------+---------------+---------+-----------+----------+--------------+ SFJ  Full                                                        +---------+---------------+---------+-----------+----------+--------------+ FV Prox  Full                                                        +---------+---------------+---------+-----------+----------+--------------+ FV Mid   Full                                                        +---------+---------------+---------+-----------+----------+--------------+ FV DistalFull                                                        +---------+---------------+---------+-----------+----------+--------------+ PFV      Full                                                        +---------+---------------+---------+-----------+----------+--------------+ POP      Full           Yes      Yes                                 +---------+---------------+---------+-----------+----------+--------------+ PTV      Full                                                        +---------+---------------+---------+-----------+----------+--------------+ PERO     Full                                                         +---------+---------------+---------+-----------+----------+--------------+    Summary: RIGHT: - There is no evidence of deep vein thrombosis in the lower extremity.  - No cystic structure found in the popliteal fossa.  LEFT: - There is no evidence of deep vein thrombosis in the lower extremity.  - No cystic structure found in the popliteal fossa.  *See table(s) above for measurements and observations. Electronically signed by [name] Martinez MD on 05/30/2020 at 2:31:02 PM.    Final    ECHOCARDIOGRAM LIMITED  Result Date: 06/01/2020    ECHOCARDIOGRAM LIMITED REPORT   Patient Name:   ELISABELLA Javid Date of Exam: 06/01/2020 Medical Rec #:  [id number]      Height:  64.0 in Accession #:    [id number]     Weight:       213.0 lb Date of Birth:  1956/01/17      BSA:          2.009 m Patient Age:    32 years       BP:           132/85 mmHg Patient Gender: F              HR:           100 bpm. Exam Location:  Inpatient Procedure: Limited Echo, Limited Color Doppler, Cardiac Doppler and Intracardiac            Opacification Agent Indications:    Bacteremia R78.81  History:        Patient has prior history of Echocardiogram examinations, most                 recent 11/11/2015. Signs/Symptoms:Shortness of Breath and Fever;                 Risk Factors:Hypertension. COVID 19 pneumonia. Hepatitis C.                 Acute kidney injury.  Sonographer:    Darlina Sicilian RDCS Referring Phys: [place]  1. Left ventricular ejection fraction, by estimation, is 60 to 65%. The left ventricle has normal function. The left ventricle has no regional wall motion abnormalities. Left ventricular diastolic parameters are consistent with Grade I diastolic dysfunction (impaired relaxation). Elevated left atrial pressure.  2. Right ventricular systolic function is normal. The right ventricular size is normal.  3. The mitral valve is normal in structure. No evidence of mitral valve  regurgitation. No evidence of mitral stenosis.  4. The aortic valve is tricuspid. Aortic valve regurgitation is not visualized. Mild aortic valve sclerosis is present, with no evidence of aortic valve stenosis. FINDINGS  Left Ventricle: Left ventricular ejection fraction, by estimation, is 60 to 65%. The left ventricle has normal function. The left ventricle has no regional wall motion abnormalities. Definity contrast agent was given IV to delineate the left ventricular  endocardial borders. The left ventricular internal cavity size was normal in size. There is no left ventricular hypertrophy. Left ventricular diastolic parameters are consistent with Grade I diastolic dysfunction (impaired relaxation). Elevated left atrial pressure. Right Ventricle: The right ventricular size is normal.Right ventricular systolic function is normal. Left Atrium: Left atrial size was normal in size. Right Atrium: Right atrial size was normal in size. Pericardium: There is no evidence of pericardial effusion. Mitral Valve: The mitral valve is normal in structure. No evidence of mitral valve stenosis. Tricuspid Valve: The tricuspid valve is normal in structure. Tricuspid valve regurgitation is trivial. No evidence of tricuspid stenosis. Aortic Valve: The aortic valve is tricuspid. Aortic valve regurgitation is not visualized. Mild aortic valve sclerosis is present, with no evidence of aortic valve stenosis. Pulmonic Valve: The pulmonic valve was not well visualized. Pulmonic valve regurgitation is not visualized. No evidence of pulmonic stenosis. Aorta: The aortic root is normal in size and structure.  LEFT VENTRICLE PLAX 2D LVIDd:         4.25 cm     Diastology LVIDs:         3.10 cm     LV e' medial:    7.07 cm/s LV PW:         0.80 cm     LV E/e'  medial:  17.3 LV IVS:        0.95 cm     LV e' lateral:   7.07 cm/s                            LV E/e' lateral: 17.3  LV Volumes (MOD) LV vol d, MOD A2C: 66.8 ml LV vol d, MOD A4C: 77.7 ml  LV vol s, MOD A2C: 29.6 ml LV vol s, MOD A4C: 35.3 ml LV SV MOD A2C:     37.2 ml LV SV MOD A4C:     77.7 ml LV SV MOD BP:      38.9 ml LEFT ATRIUM         Index LA diam:    2.90 cm 1.44 cm/m  AORTIC VALVE LVOT Vmax:   140.00 cm/s LVOT Vmean:  110.000 cm/s LVOT VTI:    0.201 m MITRAL VALVE MV Area (PHT): 3.72 cm     SHUNTS MV Decel Time: 204 msec     Systemic VTI: 0.20 m MV E velocity: 122.00 cm/s MV A velocity: 125.00 cm/s MV E/A ratio:  0.98 Kirk Ruths MD Electronically signed by Kirk Ruths MD Signature Date/Time: 06/01/2020/12:17:19 PM    Final      PERTINENT LAB RESULTS: CBC: Recent Labs    06/04/20 0159 06/05/20 0219  WBC 15.6* 14.7*  HGB 11.3* 11.2*  HCT 35.0* 32.8*  PLT 384 454*   CMET CMP     Component Value Date/Time   NA 137 06/05/2020 0219   K 3.2 (L) 06/05/2020 0219   CL 95 (L) 06/05/2020 0219   CO2 30 06/05/2020 0219   GLUCOSE 102 (H) 06/05/2020 0219   BUN 17 06/05/2020 0219   CREATININE 0.79 06/05/2020 0219   CALCIUM 8.8 (L) 06/05/2020 0219   PROT 6.8 06/05/2020 0219   ALBUMIN 2.5 (L) 06/05/2020 0219   AST 16 06/05/2020 0219   ALT 16 06/05/2020 0219   ALKPHOS 71 06/05/2020 0219   BILITOT 0.5 06/05/2020 0219   GFRNONAA >60 06/05/2020 0219    GFR Estimated Creatinine Clearance: 80.2 mL/min (by C-G formula based on SCr of 0.79 mg/dL). No results for input(s): LIPASE, AMYLASE in the last 72 hours. No results for input(s): CKTOTAL, CKMB, CKMBINDEX, TROPONINI in the last 72 hours. Invalid input(s): POCBNP Recent Labs    06/04/20 0159 06/05/20 0219  DDIMER 3.29* 4.02*   No results for input(s): HGBA1C in the last 72 hours. No results for input(s): CHOL, HDL, LDLCALC, TRIG, CHOLHDL, LDLDIRECT in the last 72 hours. No results for input(s): TSH, T4TOTAL, T3FREE, THYROIDAB in the last 72 hours.  Invalid input(s): FREET3 No results for input(s): VITAMINB12, FOLATE, FERRITIN, TIBC, IRON, RETICCTPCT in the last 72 hours. Coags: No results for input(s): INR  in the last 72 hours.  Invalid input(s): PT Microbiology: Recent Results (from the past 240 hour(s))  Blood Culture (routine x 2)     Status: Abnormal   Collection Time: 05/29/20 12:41 PM   Specimen: BLOOD  Result Value Ref Range Status   Specimen Description   Final    BLOOD RIGHT ANTECUBITAL Performed at [facility], [address] [place], [place] [address]    Special Requests   Final    BOTTLES DRAWN AEROBIC AND ANAEROBIC Blood Culture adequate volume Performed at [facility], [address], [place], [place] [address]    Culture  Setup Time   Final  GRAM POSITIVE COCCI IN PAIRS IN BOTH AEROBIC AND ANAEROBIC BOTTLES CRITICAL RESULT CALLED TO, READ BACK BY AND VERIFIED WITH: CATHY PIERCE PHARMD @0826  05/30/20 EB Performed at [facility], [address] [place], [place] [address]    Culture STREPTOCOCCUS PNEUMONIAE (A)  Final   Report Status 06/01/2020 FINAL  Final   Organism ID, Bacteria STREPTOCOCCUS PNEUMONIAE  Final      Susceptibility   Streptococcus pneumoniae - MIC*    ERYTHROMYCIN RESISTANT Resistant     LEVOFLOXACIN 0.5 SENSITIVE Sensitive     VANCOMYCIN <=0.12 SENSITIVE Sensitive     PENICILLIN (meningitis) <=0.06 SENSITIVE Sensitive     PENO - penicillin <=0.06      PENICILLIN (non-meningitis) <=0.06 SENSITIVE Sensitive     PENICILLIN (oral) <=0.06 SENSITIVE Sensitive     CEFTRIAXONE (non-meningitis) <=0.12 SENSITIVE Sensitive     CEFTRIAXONE (meningitis) <=0.12 SENSITIVE Sensitive     * STREPTOCOCCUS PNEUMONIAE  Blood Culture ID Panel (Reflexed)     Status: Abnormal   Collection Time: 05/29/20 12:41 PM  Result Value Ref Range Status   Enterococcus faecalis NOT DETECTED NOT DETECTED Final   Enterococcus Faecium NOT DETECTED NOT DETECTED Final   Listeria monocytogenes NOT DETECTED NOT DETECTED Final   Staphylococcus species NOT DETECTED NOT DETECTED Final   Staphylococcus aureus (BCID) NOT DETECTED NOT DETECTED Final    Staphylococcus epidermidis NOT DETECTED NOT DETECTED Final   Staphylococcus lugdunensis NOT DETECTED NOT DETECTED Final   Streptococcus species DETECTED (A) NOT DETECTED Final    Comment: CRITICAL RESULT CALLED TO, READ BACK BY AND VERIFIED WITH: CATHY PIERCE PHARMD @0826  05/30/20 EB    Streptococcus agalactiae NOT DETECTED NOT DETECTED Final   Streptococcus pneumoniae DETECTED (A) NOT DETECTED Final    Comment: CRITICAL RESULT CALLED TO, READ BACK BY AND VERIFIED WITH: CATHY PIERCE PHARMD @0826  05/30/20 EB    Streptococcus pyogenes NOT DETECTED NOT DETECTED Final   A.calcoaceticus-baumannii NOT DETECTED NOT DETECTED Final   Bacteroides fragilis NOT DETECTED NOT DETECTED Final   Enterobacterales NOT DETECTED NOT DETECTED Final   Enterobacter cloacae complex NOT DETECTED NOT DETECTED Final   Escherichia coli NOT DETECTED NOT DETECTED Final   Klebsiella aerogenes NOT DETECTED NOT DETECTED Final   Klebsiella oxytoca NOT DETECTED NOT DETECTED Final   Klebsiella pneumoniae NOT DETECTED NOT DETECTED Final   Proteus species NOT DETECTED NOT DETECTED Final   Salmonella species NOT DETECTED NOT DETECTED Final   Serratia marcescens NOT DETECTED NOT DETECTED Final   Haemophilus influenzae NOT DETECTED NOT DETECTED Final   Neisseria meningitidis NOT DETECTED NOT DETECTED Final   Pseudomonas aeruginosa NOT DETECTED NOT DETECTED Final   Stenotrophomonas maltophilia NOT DETECTED NOT DETECTED Final   Candida albicans NOT DETECTED NOT DETECTED Final   Candida auris NOT DETECTED NOT DETECTED Final   Candida glabrata NOT DETECTED NOT DETECTED Final   Candida krusei NOT DETECTED NOT DETECTED Final   Candida parapsilosis NOT DETECTED NOT DETECTED Final   Candida tropicalis NOT DETECTED NOT DETECTED Final   Cryptococcus neoformans/gattii NOT DETECTED NOT DETECTED Final    Comment: Performed at [facility], [address], [place], [place] [address]  Blood Culture (routine x 2)     Status: Abnormal    Collection Time: 05/29/20 12:47 PM   Specimen: BLOOD  Result Value Ref Range Status   Specimen Description   Final    BLOOD LEFT ANTECUBITAL Performed at [facility], [address], [place], [place] [id number]  Special Requests   Final    BOTTLES DRAWN AEROBIC AND ANAEROBIC Blood Culture adequate volume Performed at [facility], [address] [place], [place] [address]    Culture  Setup Time   Final    GRAM POSITIVE COCCI AEROBIC BOTTLE ONLY CRITICAL VALUE NOTED.  VALUE IS CONSISTENT WITH PREVIOUSLY REPORTED AND CALLED VALUE.    Culture (A)  Final    STREPTOCOCCUS PNEUMONIAE SUSCEPTIBILITIES PERFORMED ON PREVIOUS CULTURE WITHIN THE LAST 5 DAYS. Performed at [facility], [place] [address], [place], [place] [address]    Report Status 06/02/2020 FINAL  Final  SARS Coronavirus 2 by RT PCR (hospital order, performed in [facility] hospital lab) Nasopharyngeal Nasopharyngeal Swab     Status: Abnormal   Collection Time: 05/29/20 12:52 PM   Specimen: Nasopharyngeal Swab  Result Value Ref Range Status   SARS Coronavirus 2 POSITIVE (A) NEGATIVE Final    Comment: RESULT CALLED TO, READ BACK BY AND VERIFIED WITH:  [email or web address],RN @ 1413 ON 05/29/2020, [email or web address].P (NOTE) SARS-CoV-2 target nucleic acids are DETECTED  SARS-CoV-2 RNA is generally detectable in upper respiratory specimens  during the acute phase of infection.  Positive results are indicative  of the presence of the identified virus, but do not rule out bacterial infection or co-infection with other pathogens not detected by the test.  Clinical correlation with patient history and  other diagnostic information is necessary to determine patient infection status.  The expected result is negative.  Fact Sheet for Patients:   StrictlyIdeas.no   Fact Sheet for Healthcare Providers:   BankingDealers.co.za    This test is not yet approved or cleared by the  [place] FDA and  has been authorized for detection and/or diagnosis of SARS-CoV-2 by FDA under an Emergency Use Authorization (EUA).  This EUA will remain in effect (mea ning this test can be used) for the duration of  the COVID-19 declaration under Section 564(b)(1) of the Act, 21 U.S.C. section 360-bbb-3(b)(1), unless the authorization is terminated or revoked sooner.  Performed at [facility], [address] [place], [place] [address]   Culture, blood (routine x 2)     Status: None (Preliminary result)   Collection Time: 06/01/20  7:46 AM   Specimen: BLOOD  Result Value Ref Range Status   Specimen Description BLOOD RIGHT ANTECUBITAL  Final   Special Requests   Final    BOTTLES DRAWN AEROBIC AND ANAEROBIC Blood Culture results may not be optimal due to an inadequate volume of blood received in culture bottles   Culture   Final    NO GROWTH 3 DAYS Performed at [facility], [place] [address] [place], [place] [address]    Report Status PENDING  Incomplete  Culture, blood (routine x 2)     Status: None (Preliminary result)   Collection Time: 06/01/20  7:58 AM   Specimen: BLOOD RIGHT FOREARM  Result Value Ref Range Status   Specimen Description BLOOD RIGHT FOREARM  Final   Special Requests   Final    BOTTLES DRAWN AEROBIC ONLY Blood Culture results may not be optimal due to an inadequate volume of blood received in culture bottles   Culture   Final    NO GROWTH 3 DAYS Performed at [facility], [place] [address], [place], [place] [address]    Report Status PENDING  Incomplete    FURTHER DISCHARGE INSTRUCTIONS:  Get Medicines reviewed and adjusted: Please take all your medications  with you for your next visit with your Primary MD  Laboratory/radiological data: Please request your Primary MD to go over all hospital tests and procedure/radiological results at the follow up, please ask your Primary MD to get all Hospital records sent to his/her  office.  In some cases, they will be blood work, cultures and biopsy results pending at the time of your discharge. Please request that your primary care M.D. goes through all the records of your hospital data and follows up on these results.  Also Note the following: If you experience worsening of your admission symptoms, develop shortness of breath, life threatening emergency, suicidal or homicidal thoughts you must seek medical attention immediately by calling 911 or calling your MD immediately  if symptoms less severe.  You must read complete instructions/literature along with all the possible adverse reactions/side effects for all the Medicines you take and that have been prescribed to you. Take any new Medicines after you have completely understood and accpet all the possible adverse reactions/side effects.   Do not drive when taking Pain medications or sleeping medications (Benzodaizepines)  Do not take more than prescribed Pain, Sleep and Anxiety Medications. It is not advisable to combine anxiety,sleep and pain medications without talking with your primary care practitioner  Special Instructions: If you have smoked or chewed Tobacco  in the last 2 yrs please stop smoking, stop any regular Alcohol  and or any Recreational drug use.  Wear Seat belts while driving.  Please note: You were cared for by a hospitalist during your hospital stay. Once you are discharged, your primary care physician will handle any further medical issues. Please note that NO REFILLS for any discharge medications will be authorized once you are discharged, as it is imperative that you return to your primary care physician (or establish a relationship with a primary care physician if you do not have one) for your post hospital discharge needs so that they can reassess your need for medications and monitor your lab values.  Total Time spent coordinating discharge including counseling, education and face to face time  equals 35 minutes.  SignedOren Binet 06/05/2020 12:07 PM

## 2020-06-05 NOTE — Progress Notes (Signed)
Name: [name] MRN: [id number] DOB: November 10, 1955    ADMISSION DATE:  05/29/2020 CONSULTATION DATE: 06/04/2020  REFERRING MD : [facility]  CHIEF COMPLAINT: Status post Covid infection now with left lower lobe consolidation  BRIEF PATIENT DESCRIPTION: Morbid obese female in no acute distress  SIGNIFICANT EVENTS  06/04/2020 attempted thoracentesis unable to obtain fluid.  STUDIES:  06/01/2020 CT chest left lower lobe consolidation   HISTORY OF PRESENT ILLNESS: 65 year old female who has past medical history is well-documented below who presented on 05/29/2020 with increasing respiratory distress found to have Covid.  Treated with usual pharmaceutical interventions with improvement.  Currently she is on room air with O2 saturations greater 94%.  CT scan on 06/01/2018 did reveal a consolidation in the left lower lobe consistent with pneumonia.  Ultrasound does reveal some pleural fluid in that space.  She is no acute respiratory distress.  Question if thoracentesis would be helpful at this time.   PAST MEDICAL HISTORY :  Past Medical History:  Diagnosis Date  . Asthma   . Depression   . Erosive esophagitis   . Headaches, cluster   . Hepatitis C   . HTN (hypertension)    SUBJECTIVE:  Awake alert no acute distress VITAL SIGNS: Temp:  [98 F (36.7 C)-98.8 F (37.1 C)] 98.5 F (36.9 C) (02/12 0427) Pulse Rate:  [95-100] 95 (02/12 0427) Resp:  [16-20] 18 (02/12 0427) BP: (130-151)/(71-88) 151/80 (02/12 0427) SpO2:  [93 %-99 %] 94 % (02/12 0427)  PHYSICAL EXAMINATION: General: Well-nourished well-developed female no acute distress not on oxygen HEENT: No JVD or lymphadenopathy Neuro: Grossly intact without focal defect CV: Heart sounds are regular PULM: Diminished in the bases GI: soft, bsx4 active  Extremities: warm/dry,  edema  Skin: no rashes or lesions   Recent Labs  Lab 06/03/20 0106 06/04/20 0159 06/05/20 0219  NA 140 137 137  K 3.0* 4.4 3.2*  CL 94* 95* 95*  CO2 30  28 30   BUN 15 17 17   CREATININE 0.88 0.81 0.79  GLUCOSE 89 96 102*   Recent Labs  Lab 06/03/20 0106 06/04/20 0159 06/05/20 0219  HGB 11.1* 11.3* 11.2*  HCT 33.0* 35.0* 32.8*  WBC 16.2* 15.6* 14.7*  PLT 393 384 454*   DG CHEST PORT 1 VIEW  Result Date: 06/04/2020 CLINICAL DATA:  Status post left-sided thoracentesis. EXAM: PORTABLE CHEST 1 VIEW COMPARISON:  June 04, 2020 (4:41 a.m.) FINDINGS: Stable, marked severity infiltrate is seen within the left lower lobe. A stable moderate size left-sided pleural effusion is seen. No pneumothorax is identified. The heart size and mediastinal contours are within normal limits. The visualized skeletal structures are unremarkable. IMPRESSION: 1. Stable left lower lobe infiltrate and moderate size left-sided pleural effusion. No pneumothorax. 2. No pneumothorax, status post thoracentesis. Electronically Signed   By: Virgina Norfolk M.D.   On: 06/04/2020 18:44   DG Chest Port 1V same Day  Result Date: 06/04/2020 CLINICAL DATA:  65 year old female COVID-19. Left lower lobe pneumonia. EXAM: PORTABLE CHEST 1 VIEW COMPARISON:  Chest CT 06/01/2020 and earlier. FINDINGS: Portable AP upright view at 0441 hours. Mildly improved lung volumes from yesterday. Ongoing dense left lower lobe consolidation. Lingula also affected on chest CTA yesterday which demonstrated trace pleural fluid. Stable cardiac size and mediastinal contours. Right lung remains clear. Visualized tracheal air column is within normal limits. Paucity of bowel gas. Stable visualized osseous structures. IMPRESSION: Stable left lung base pneumonia.  Mildly improved lung volumes. Electronically Signed   By: Genevie Ann  M.D.   On: 06/04/2020 07:10  Discussion: 65 year old female diagnosed with COVID 05/29/2020 and admitted to the hospital with notable respiratory distress.  She has a past medical history significant for asthma depression hypertension hepatitis C.  She complains of fevers that reaches a  level of 103.  She had not been vaccinated for Covid.  After admission she tolerated treatment well is now on room air and has no acute distress.  She does states she has some mild chest pain with coughing.  She did have CT of the chest 06/01/2020 significant for dense collagen consolidation in the left lower lobe compatible with changes of acute lobar pneumonia.  She does have reported small parapneumonic effusion also noted.  Pulmonary critical care was asked to evaluate for pleural effusion. Unable to obtain fluid.   ASSESSMENT: Active Problems:   HTN (hypertension)   Depression   Asthma   ARF (acute renal failure) (HCC)   Pneumonia due to COVID-19 virus + 05/29/20   Hypokalemia   Hyponatremia   Lactic acidosis   Hypomagnesemia   Elevated d-dimer  Status post Covid 05/29/2020 now is on room air.  CT scan concerning for lobar pneumonia left lower lobe.  Pulmonary critical care asked to evaluate possible thoracentesis              PLAN: 06/04/2020 thoracentesis attempted and was unsuccessful Continue with antimicrobial therapy Follow-up is been set up at pulmonary critical care for the next 10 days Pulmonary critical care will sign off at this time  Continue pulmonary toilet  Richardson Landry Dahiana Kulak ACNP Acute Care Nurse Practitioner [place] Please consult [place] 06/05/2020, 11:34 AM

## 2020-06-05 NOTE — TOC Transition Note (Signed)
Transition of Care [facility]) - CM/SW Discharge Note   Patient Details  Name: [name] MRN: [id number] Date of Birth: 1955-05-11  Transition of Care [facility] CM/SW Contact:  Carles Collet, RN Phone Number: 06/05/2020, 12:47 PM   Clinical Narrative:    Kaylyn Layer of DC today. No other CM needs identified.     Final next level of care: [place] Barriers to Discharge: No Barriers Identified   Patient Goals and CMS Choice     Choice offered to / list presented to : Adult Children Earnest Bailey ( daughter))  Discharge Placement                       Discharge Plan and Services   Discharge Planning Services: CM Consult Post Acute Care Choice: Home Health              Date DME Agency Contacted: 06/05/20 Time DME Agency Contacted: [id number] Representative spoke with at DME Agency: [place]: PT [place]: [place] Date [place]: 06/04/20 Time [place]: [phone] Representative spoke with at [place]: [place] ([place]) Interventions     Readmission Risk Interventions No flowsheet data found.

## 2020-06-06 LAB — CULTURE, BLOOD (ROUTINE X 2)
Culture: NO GROWTH
Culture: NO GROWTH

## 2020-06-07 NOTE — Telephone Encounter (Signed)
Thanks for all you do! Yaqueline Gutter

## 2020-06-07 NOTE — Telephone Encounter (Signed)
Spoke with pt and scheduled with Wyn Quaker at 11am on 06/08/20. Placed order for chest x-ray. Nothing further needed at this time.   Routing to Wyn Quaker, FNP as Juluis Rainier

## 2020-06-08 ENCOUNTER — Ambulatory Visit (INDEPENDENT_AMBULATORY_CARE_PROVIDER_SITE_OTHER): Payer: Managed Care, Other (non HMO) | Admitting: Pulmonary Disease

## 2020-06-08 ENCOUNTER — Ambulatory Visit (INDEPENDENT_AMBULATORY_CARE_PROVIDER_SITE_OTHER): Payer: Managed Care, Other (non HMO)

## 2020-06-08 ENCOUNTER — Other Ambulatory Visit: Payer: Self-pay

## 2020-06-08 ENCOUNTER — Encounter: Payer: Self-pay | Admitting: Pulmonary Disease

## 2020-06-08 VITALS — BP 110/78 | HR 90 | Temp 98.0°F | Wt 211.4 lb

## 2020-06-08 DIAGNOSIS — E876 Hypokalemia: Secondary | ICD-10-CM

## 2020-06-08 DIAGNOSIS — R0789 Other chest pain: Secondary | ICD-10-CM

## 2020-06-08 DIAGNOSIS — R059 Cough, unspecified: Secondary | ICD-10-CM | POA: Diagnosis not present

## 2020-06-08 DIAGNOSIS — U071 COVID-19: Secondary | ICD-10-CM

## 2020-06-08 DIAGNOSIS — J90 Pleural effusion, not elsewhere classified: Secondary | ICD-10-CM

## 2020-06-08 LAB — COMPREHENSIVE METABOLIC PANEL
ALT: 23 U/L (ref 0–35)
AST: 27 U/L (ref 0–37)
Albumin: 3.5 g/dL (ref 3.5–5.2)
Alkaline Phosphatase: 70 U/L (ref 39–117)
BUN: 17 mg/dL (ref 6–23)
CO2: 31 mEq/L (ref 19–32)
Calcium: 9.7 mg/dL (ref 8.4–10.5)
Chloride: 97 mEq/L (ref 96–112)
Creatinine, Ser: 0.61 mg/dL (ref 0.40–1.20)
GFR: 94.2 mL/min (ref >60.00)
Glucose, Bld: 74 mg/dL (ref 70–99)
Potassium: 3.6 mEq/L (ref 3.5–5.1)
Sodium: 137 mEq/L (ref 135–145)
Total Bilirubin: 0.3 mg/dL (ref 0.2–1.2)
Total Protein: 8.2 g/dL (ref 6.0–8.3)

## 2020-06-08 MED ORDER — POTASSIUM CHLORIDE ER 10 MEQ PO TBCR: 10.0000 meq | EXTENDED_RELEASE_TABLET | Freq: Every day | ORAL | 0 refills | Status: DC

## 2020-06-08 MED ORDER — BENZONATATE 200 MG PO CAPS: 200.0000 mg | ORAL_CAPSULE | Freq: Three times a day (TID) | ORAL | 3 refills | Status: DC | PRN

## 2020-06-08 NOTE — Assessment & Plan Note (Signed)
Plan: Referral to cardiology for further evaluation of atypical chest pain

## 2020-06-08 NOTE — Assessment & Plan Note (Addendum)
Plan: Walk today in office Chest x-ray today Pulmonary function testing ordered Consider repeat CT scan if chest x-ray does not fully clear post Covid Strongly recommend patient obtain COVID-19 vaccination

## 2020-06-08 NOTE — Progress Notes (Signed)
_0  ID: [name], female    DOB: [birth year], 65 y.o.   MRN: [id number]  Chief Complaint  Patient presents with  . Hospitalization Follow-up    Covid 05/29/20. Needs repeat CXR per documentation. Reports she is still fatigued. Denies chest pain but is still SOB. Did not get covid vaccine. Has not yet gotten her flu vaccine. She does have some soreness for the thoracentesis.     Referring provider: Lujean Amel, MD  HPI:  65 year old female never smoker followed in our office for COVID-19 infection, requiring hospitalization in February/2022  PMH: Hypertension, depression, hypokalemia Smoker/ Smoking History: Never smoker Maintenance: None Pt of: Needs outpatient pulmonologist  06/08/2020  - Visit   65 year old female never smoker presenting to office today as a hospital follow-up.  Patient status post COVID-19 with respiratory failure requiring hospitalization.  Excerpt of hospital discharge summary is listed below:   Admit Date: 05/29/2020 Discharge date: 06/05/2020  Recommendations for Outpatient Follow-up:  1. Follow up with PCP in 1-2 weeks 2. Please obtain CMP/CBC in one week 3. Please ensure follow-up with pulmonology-with repeat imaging in the next 7 to 10 days. 4. Switched from Avapro to metoprolol-see below regarding tachycardia.  Brief Narrative: Patient is a52 y.o.femalewith PMHx of HTN, hepatitis C, asthma, depression-who tested positive for COVID-19 on 1/30 (Home test)-after multiple family exposures-presented to the hospital with fevers, chills, cough, diarrhea and shortness of breath-she was found to have AKI, left-sided infiltrate-subsequent blood cultures positive for pneumococcus. See below for further details.  COVID-19 vaccinated status: Unvaccinated  Significant Events: 2/5>> Admit to [facility] for AKI, PNA-blood cultures positive for pneumococcus 2/8>> left-sided pleuritic chest pain/2 L oxygen requirement/tachycardic  Significant  studies: 2/5>>Chest x-ray: Left lower pneumonia, bilateral hilar fullness 2/6>> bilateral lower extremity Doppler: No DVT 2/8>> CTA chest: No PE, dense LLE-small parapneumonic effusion 2/8>>Echo: EF 60-65%, no wall motion abnormalities, grade 1 diastolic dysfunction  [id number] medications: Paxlovid:2/2>>2/8  Antibiotics: Rocephin: 2/6>>  Microbiology data: 2/5 >>blood culture: Streptococcus pneumonae 2/8>> blood culture: No growth  Procedures: 2/11>> attempted thoracocentesis-dry tap  Consults: [facility] Course: Sepsis due to lobar pneumoniawithpneumococcal bacteremia:Sepsis physiology has resolved-tachycardia better with metoprolol-continues to have left-sided pleuritic chest pain and persistent leukocytosis. Given dense left lower lobe infiltrate-bacteremia-concerned that she may be developing an empyema.  PCCM was consulted on 2/11 for thoracocentesis-however tap was dry.  Overall she has improved-suspect she is stable to be transitioned to oral antimicrobial therapy for one additional week (curbsided ID Dr Myrtice Lauth follow closely with PCP/pulmonology in around 7 to 10 days with repeat imaging. Both patient and patient's daughter are aware-that if she develops worsening fever/worsening chest pain-the need to seek immediate medical attention to rule out an empyema.  Left-sided pleuritic chest pain: Likely due to LL PNA-no PE on CTA chest. Could also possibly be from musculoskeletal strain from coughing. Supportive care.   YME:[id number] mediated due to sepsis/bacteremia-resolved.  Lower extremity edema: Probably due to hypoalbuminemia and diastolic CHF.   Slowly improving- continue chlorthalidone in the outpatient setting.  Follow with PCP.    HTN: Resume chlorthalidone-switch losartan to metoprolol (tachycardic).  Echo with preserved EF.  Sinus tachycardia: Improved-suspect physiological response to infection/left pleuritic pain.  If it  persists-PCP to consider checking TSH (if not done recently).  Hypomagnesemia: Repleted  Hypokalemia: Repleted prior to discharge-follow with PCP.  COVID-19 infection: Suspect that main issues are from pneumococcal bacteremia/PNA-has finished a course of Paxlovid. CRP significantly elevated-but downtrending.  She remains asymptomatic from COVID-19 infection.  She  was encouraged to get vaccinated-in approximately 1 month.  Patient presented to her office today reporting that she feels that she is slowly improving since discharge from hospitalization.  She remains unvaccinated COVID-19.  She is using over-the-counter cough suppressant such as Delsym or Robitussin-DM.  Patient is questions today about whether or not she should be maintained on a DOAC.  She reports that she had a partial knee replacement on 04/29/2020 when she was put on DOAC for 30 days since her brother had a history of blood clots.  Patient has never had a PE or a DVT or any VTE event.  Patient has a negative CTA chest with this hospitalization as well as a negative lower extremity Doppler.  Patient is unsure if she should be maintained on DOAC.  Since returning home patient feels that she is slowly getting better.  She is occasionally using her incentive spirometer only obtaining a volume of around 800 cc.  She is also using her flutter valve.  She is having less mucus production than before but she is still having a productive cough with yellow mucus.  She needs an outpatient pulmonologist to establish with.  She continues to have left sided chest wall pain.  There is an attempted thoracentesis during her hospitalization that did not yield any pleural fluid.  Patient continues to have ongoing atypical chest pain.  Questionaires / Pulmonary Flowsheets:   ACT:  No flowsheet data found.  MMRC: No flowsheet data found.  Epworth:  No flowsheet data found.  Tests:   FENO:  No results found for: NITRICOXIDE  PFT: No  flowsheet data found.  WALK:  No flowsheet data found.  Imaging: DG Chest 2 View  Result Date: 06/08/2020 CLINICAL DATA:  Post COVID, pleural effusion EXAM: CHEST - 2 VIEW COMPARISON:  06/04/2020 FINDINGS: Large left pleural effusion again noted, unchanged. Left lower lobe atelectasis or infiltrate. Right lung clear. Heart is normal size. No effusions. IMPRESSION: Large left pleural effusion with left lower lobe atelectasis or infiltrate, unchanged. Electronically Signed   By: Rolm Baptise M.D.   On: 06/08/2020 12:24   CT ANGIO CHEST PE W OR WO CONTRAST  Result Date: 06/01/2020 CLINICAL DATA:  COVID pneumonia, left lower lobe consolidation, dyspnea EXAM: CT ANGIOGRAPHY CHEST WITH CONTRAST TECHNIQUE: Multidetector CT imaging of the chest was performed using the standard protocol during bolus administration of intravenous contrast. Multiplanar CT image reconstructions and MIPs were obtained to evaluate the vascular anatomy. CONTRAST:  37m OMNIPAQUE IOHEXOL 350 MG/ML SOLN COMPARISON:  CT coronary calcium 11/11/2015 FINDINGS: Cardiovascular: Satisfactory opacification of the pulmonary arteries to the segmental level. No evidence of pulmonary embolism. Normal heart size. No pericardial effusion. The central pulmonary arteries are of normal caliber. The thoracic aorta is unremarkable. Mediastinum/Nodes: No enlarged mediastinal, hilar, or axillary lymph nodes. Thyroid gland, trachea, and esophagus demonstrate no significant findings. Lungs/Pleura: There is dense left lower lobe consolidation in keeping with changes of acute lobar pneumonia in the appropriate clinical setting. There is endobronchial debris resulting in impaction of a the bronchi of the left lower lobe. Mild scattered infiltrate is seen within the basilar lingula and right upper lobe, likely infectious or inflammatory and potentially related to endobronchial spread of disease. A small amount of debris is noted within the right lower lobe  bronchus. Small left pleural effusion is present. No pneumothorax. Upper Abdomen: At least moderate hepatic steatosis is noted. Musculoskeletal: No acute bone abnormality. Review of the MIP images confirms the above findings. IMPRESSION: Dense consolidation  and airway impaction involving the left lower lobe compatible with changes of acute lobar pneumonia in the appropriate clinical setting. Small parapneumonic effusion noted. Scattered infiltrate within the right upper lobe and basilar lingula as well as endobronchial debris within the left lower lobe possibly representing endobronchial spread of disease. Aspiration could also appear in this fashion, though its asymmetry involving the left lower lobe makes this less likely. No pulmonary embolism Electronically Signed   By: Fidela Salisbury MD   On: 06/01/2020 11:34   DG CHEST PORT 1 VIEW  Result Date: 06/04/2020 CLINICAL DATA:  Status post left-sided thoracentesis. EXAM: PORTABLE CHEST 1 VIEW COMPARISON:  June 04, 2020 (4:41 a.m.) FINDINGS: Stable, marked severity infiltrate is seen within the left lower lobe. A stable moderate size left-sided pleural effusion is seen. No pneumothorax is identified. The heart size and mediastinal contours are within normal limits. The visualized skeletal structures are unremarkable. IMPRESSION: 1. Stable left lower lobe infiltrate and moderate size left-sided pleural effusion. No pneumothorax. 2. No pneumothorax, status post thoracentesis. Electronically Signed   By: Virgina Norfolk M.D.   On: 06/04/2020 18:44   DG Chest Portable 1 View  Result Date: 05/29/2020 CLINICAL DATA:  COVID positive.  Shortness of breath.  Weakness. EXAM: PORTABLE CHEST 1 VIEW COMPARISON:  Two-view chest x-ray 01/13/2015 FINDINGS: Heart size is normal. Hilar fullness present bilaterally. Asymmetric left lower lobe airspace opacities are present. Upper lung fields are clear. IMPRESSION: 1. Asymmetric left lower lobe airspace disease compatible  with pneumonia. 2. Bilateral hilar fullness. Underlying adenopathy is not excluded. Electronically Signed   By: San Morelle M.D.   On: 05/29/2020 11:35   DG Chest Port 1V same Day  Result Date: 06/04/2020 CLINICAL DATA:  65 year old female COVID-19. Left lower lobe pneumonia. EXAM: PORTABLE CHEST 1 VIEW COMPARISON:  Chest CT 06/01/2020 and earlier. FINDINGS: Portable AP upright view at 0441 hours. Mildly improved lung volumes from yesterday. Ongoing dense left lower lobe consolidation. Lingula also affected on chest CTA yesterday which demonstrated trace pleural fluid. Stable cardiac size and mediastinal contours. Right lung remains clear. Visualized tracheal air column is within normal limits. Paucity of bowel gas. Stable visualized osseous structures. IMPRESSION: Stable left lung base pneumonia.  Mildly improved lung volumes. Electronically Signed   By: Genevie Ann M.D.   On: 06/04/2020 07:10   DG Chest Port 1V same Day  Result Date: 05/31/2020 CLINICAL DATA:  COVID, shortness of breath EXAM: PORTABLE CHEST 1 VIEW COMPARISON:  05/29/2020 FINDINGS: Heart is normal size. Airspace opacity noted in the left lower lobe. Small left pleural effusion. No confluent opacity or effusion on the right. No acute bony abnormality. IMPRESSION: Left lower lobe pneumonia.  Small left effusion. Electronically Signed   By: Rolm Baptise M.D.   On: 05/31/2020 20:23   VAS [place] LOWER EXTREMITY VENOUS (DVT)  Result Date: 05/30/2020  Lower Venous DVT Study Indications: Covid.  Risk Factors: Surgery Patient reports partial LT knee replacement in January. Anticoagulation: Lovenox. Comparison       11-05-2015 Bilateral lower extremity venous was negative for Study:           DVT. Performing Technologist: Darlin Coco RDMS  Examination Guidelines: A complete evaluation includes B-mode imaging, spectral Doppler, color Doppler, and power Doppler as needed of all accessible portions of each vessel. Bilateral testing is considered an  integral part of a complete examination. Limited examinations for reoccurring indications may be performed as noted. The reflux portion of the exam is performed with the  patient in reverse Trendelenburg.  +---------+---------------+---------+-----------+----------+--------------+ RIGHT    CompressibilityPhasicitySpontaneityPropertiesThrombus Aging +---------+---------------+---------+-----------+----------+--------------+ CFV      Full           Yes      Yes                                 +---------+---------------+---------+-----------+----------+--------------+ SFJ      Full                                                        +---------+---------------+---------+-----------+----------+--------------+ FV Prox  Full                                                        +---------+---------------+---------+-----------+----------+--------------+ FV Mid   Full                                                        +---------+---------------+---------+-----------+----------+--------------+ FV DistalFull                                                        +---------+---------------+---------+-----------+----------+--------------+ PFV      Full                                                        +---------+---------------+---------+-----------+----------+--------------+ POP      Full           Yes      Yes                                 +---------+---------------+---------+-----------+----------+--------------+ PTV      Full                                                        +---------+---------------+---------+-----------+----------+--------------+ PERO     Full                                                        +---------+---------------+---------+-----------+----------+--------------+   +---------+---------------+---------+-----------+----------+--------------+ LEFT     CompressibilityPhasicitySpontaneityPropertiesThrombus  Aging +---------+---------------+---------+-----------+----------+--------------+ CFV      Full           Yes      Yes                                 +---------+---------------+---------+-----------+----------+--------------+  SFJ      Full                                                        +---------+---------------+---------+-----------+----------+--------------+ FV Prox  Full                                                        +---------+---------------+---------+-----------+----------+--------------+ FV Mid   Full                                                        +---------+---------------+---------+-----------+----------+--------------+ FV DistalFull                                                        +---------+---------------+---------+-----------+----------+--------------+ PFV      Full                                                        +---------+---------------+---------+-----------+----------+--------------+ POP      Full           Yes      Yes                                 +---------+---------------+---------+-----------+----------+--------------+ PTV      Full                                                        +---------+---------------+---------+-----------+----------+--------------+ PERO     Full                                                        +---------+---------------+---------+-----------+----------+--------------+    Summary: RIGHT: - There is no evidence of deep vein thrombosis in the lower extremity.  - No cystic structure found in the popliteal fossa.  LEFT: - There is no evidence of deep vein thrombosis in the lower extremity.  - No cystic structure found in the popliteal fossa.  *See table(s) above for measurements and observations. Electronically signed by [name] Martinez MD on 05/30/2020 at 2:31:02 PM.    Final    ECHOCARDIOGRAM LIMITED  Result Date: 06/01/2020    ECHOCARDIOGRAM LIMITED REPORT    Patient Name:   DAYNE DEKAY Date of Exam: 06/01/2020 Medical Rec #:  [id number]  Height:       64.0 in Accession #:    [id number]     Weight:       213.0 lb Date of Birth:  Oct 04, 1955      BSA:          2.009 m Patient Age:    64 years       BP:           132/85 mmHg Patient Gender: F              HR:           100 bpm. Exam Location:  Inpatient Procedure: Limited Echo, Limited Color Doppler, Cardiac Doppler and Intracardiac            Opacification Agent Indications:    Bacteremia R78.81  History:        Patient has prior history of Echocardiogram examinations, most                 recent 11/11/2015. Signs/Symptoms:Shortness of Breath and Fever;                 Risk Factors:Hypertension. COVID 19 pneumonia. Hepatitis C.                 Acute kidney injury.  Sonographer:    Darlina Sicilian RDCS Referring Phys: [place]  1. Left ventricular ejection fraction, by estimation, is 60 to 65%. The left ventricle has normal function. The left ventricle has no regional wall motion abnormalities. Left ventricular diastolic parameters are consistent with Grade I diastolic dysfunction (impaired relaxation). Elevated left atrial pressure.  2. Right ventricular systolic function is normal. The right ventricular size is normal.  3. The mitral valve is normal in structure. No evidence of mitral valve regurgitation. No evidence of mitral stenosis.  4. The aortic valve is tricuspid. Aortic valve regurgitation is not visualized. Mild aortic valve sclerosis is present, with no evidence of aortic valve stenosis. FINDINGS  Left Ventricle: Left ventricular ejection fraction, by estimation, is 60 to 65%. The left ventricle has normal function. The left ventricle has no regional wall motion abnormalities. Definity contrast agent was given IV to delineate the left ventricular  endocardial borders. The left ventricular internal cavity size was normal in size. There is no left ventricular hypertrophy. Left  ventricular diastolic parameters are consistent with Grade I diastolic dysfunction (impaired relaxation). Elevated left atrial pressure. Right Ventricle: The right ventricular size is normal.Right ventricular systolic function is normal. Left Atrium: Left atrial size was normal in size. Right Atrium: Right atrial size was normal in size. Pericardium: There is no evidence of pericardial effusion. Mitral Valve: The mitral valve is normal in structure. No evidence of mitral valve stenosis. Tricuspid Valve: The tricuspid valve is normal in structure. Tricuspid valve regurgitation is trivial. No evidence of tricuspid stenosis. Aortic Valve: The aortic valve is tricuspid. Aortic valve regurgitation is not visualized. Mild aortic valve sclerosis is present, with no evidence of aortic valve stenosis. Pulmonic Valve: The pulmonic valve was not well visualized. Pulmonic valve regurgitation is not visualized. No evidence of pulmonic stenosis. Aorta: The aortic root is normal in size and structure.  LEFT VENTRICLE PLAX 2D LVIDd:         4.25 cm     Diastology LVIDs:         3.10 cm     LV e' medial:    7.07 cm/s LV PW:         0.80  cm     LV E/e' medial:  17.3 LV IVS:        0.95 cm     LV e' lateral:   7.07 cm/s                            LV E/e' lateral: 17.3  LV Volumes (MOD) LV vol d, MOD A2C: 66.8 ml LV vol d, MOD A4C: 77.7 ml LV vol s, MOD A2C: 29.6 ml LV vol s, MOD A4C: 35.3 ml LV SV MOD A2C:     37.2 ml LV SV MOD A4C:     77.7 ml LV SV MOD BP:      38.9 ml LEFT ATRIUM         Index LA diam:    2.90 cm 1.44 cm/m  AORTIC VALVE LVOT Vmax:   140.00 cm/s LVOT Vmean:  110.000 cm/s LVOT VTI:    0.201 m MITRAL VALVE MV Area (PHT): 3.72 cm     SHUNTS MV Decel Time: 204 msec     Systemic VTI: 0.20 m MV E velocity: 122.00 cm/s MV A velocity: 125.00 cm/s MV E/A ratio:  0.98 Kirk Ruths MD Electronically signed by Kirk Ruths MD Signature Date/Time: 06/01/2020/12:17:19 PM    Final     Lab Results:  CBC    Component  Value Date/Time   WBC 14.7 (H) 06/05/2020 0219   RBC 3.82 (L) 06/05/2020 0219   HGB 11.2 (L) 06/05/2020 0219   HCT 32.8 (L) 06/05/2020 0219   PLT 454 (H) 06/05/2020 0219   MCV 85.9 06/05/2020 0219   MCH 29.3 06/05/2020 0219   MCHC 34.1 06/05/2020 0219   RDW 14.6 06/05/2020 0219   LYMPHSABS 1.4 06/01/2020 0215   MONOABS 1.0 06/01/2020 0215   EOSABS 0.1 06/01/2020 0215   BASOSABS 0.1 06/01/2020 0215    BMET    Component Value Date/Time   NA 137 06/05/2020 0219   K 3.2 (L) 06/05/2020 0219   CL 95 (L) 06/05/2020 0219   CO2 30 06/05/2020 0219   GLUCOSE 102 (H) 06/05/2020 0219   BUN 17 06/05/2020 0219   CREATININE 0.79 06/05/2020 0219   CALCIUM 8.8 (L) 06/05/2020 0219   GFRNONAA >60 06/05/2020 0219    BNP    Component Value Date/Time   BNP 179.3 (H) 06/01/2020 0803    ProBNP No results found for: PROBNP  Specialty Problems      Pulmonary Problems   Asthma   Pneumonia due to COVID-19 virus   Cough   Pleural effusion, left      Allergies  Allergen Reactions  . Atenolol     FATIGUE/ HYPOTENSION  . Lisinopril     COUGH/ WHEEZING  . Paxil [Paroxetine Hcl]     INCREASED APPETITE FOR SWEETS  . Zoloft [Sertraline Hcl]     TINNITUS  . Elemental Sulfur Rash    FEVER     There is no immunization history on file for this patient.  Past Medical History:  Diagnosis Date  . Asthma   . Depression   . Erosive esophagitis   . Headaches, cluster   . Hepatitis C   . HTN (hypertension)     Tobacco History: Social History   Tobacco Use  Smoking Status Never Smoker  Smokeless Tobacco Never Used   Counseling given: Not Answered   Continue to not smoke  Outpatient Encounter Medications as of 06/08/2020  Medication Sig  . amoxicillin (AMOXIL) 500  MG capsule Take 2 capsules (1,000 mg total) by mouth 3 (three) times daily for 7 days.  . benzonatate (TESSALON) 200 MG capsule Take 1 capsule (200 mg total) by mouth 3 (three) times daily as needed for cough.  .  Calcium Carb-Cholecalciferol (CALCIUM-VITAMIN D3) 600-400 MG-UNIT TABS Take 1 tablet by mouth daily.  . chlorthalidone (HYGROTON) 25 MG tablet Take 25 mg by mouth daily.  Marland Kitchen gabapentin (NEURONTIN) 100 MG capsule Take 100 mg by mouth in the morning, at noon, and at bedtime.  . metoprolol tartrate (LOPRESSOR) 50 MG tablet Take 1 tablet (50 mg total) by mouth 2 (two) times daily.  . Multiple Vitamins-Minerals (MULTIVITAMIN PO) Take 1 tablet by mouth daily.  . naproxen sodium (ALEVE) 220 MG tablet Take 220 mg by mouth 2 (two) times daily as needed (pain).  Marland Kitchen omeprazole (PRILOSEC) 40 MG capsule Take 40 mg by mouth daily.  Marland Kitchen oxyCODONE (OXY IR/ROXICODONE) 5 MG immediate release tablet Take 5 mg by mouth 4 (four) times daily as needed for moderate pain.  . potassium chloride (KLOR-CON) 10 MEQ tablet Take 1 tablet (10 mEq total) by mouth daily.  . sennosides-docusate sodium (SENOKOT-S) 8.6-50 MG tablet Take 1 tablet by mouth as needed (when taking pain medication).  . baclofen (LIORESAL) 10 MG tablet Take 1 tablet by mouth daily as needed for muscle spasms. (Patient not taking: Reported on 06/08/2020)  . ondansetron (ZOFRAN) 4 MG tablet Take 4 mg by mouth every 8 (eight) hours as needed for nausea. (Patient not taking: Reported on 06/08/2020)  . Venlafaxine HCl 75 MG TB24 Take 37.5 mg by mouth daily.  (Patient not taking: Reported on 06/08/2020)   No facility-administered encounter medications on file as of 06/08/2020.     Review of Systems  Review of Systems  Constitutional: Positive for fatigue and fever (Occasional low-grade fever since being discharged from the hospital.). Negative for activity change and appetite change.  HENT: Negative for sinus pressure, sinus pain and sore throat.   Respiratory: Positive for shortness of breath. Negative for cough and wheezing.   Cardiovascular: Positive for chest pain. Negative for palpitations.  Gastrointestinal: Negative for diarrhea, nausea and vomiting.   Musculoskeletal: Negative for arthralgias.  Neurological: Negative for dizziness.  Psychiatric/Behavioral: Negative for sleep disturbance. The patient is not nervous/anxious.      Physical Exam  BP 110/78   Pulse 90   Temp 98 F (36.7 C) (Oral)   Wt 211 lb 6.4 oz (95.9 kg)   SpO2 96%   BMI 36.29 kg/m   Wt Readings from Last 5 Encounters:  06/08/20 211 lb 6.4 oz (95.9 kg)  05/29/20 213 lb (96.6 kg)  06/04/17 211 lb 3.2 oz (95.8 kg)  03/15/16 201 lb 3.2 oz (91.3 kg)  12/01/15 216 lb (98 kg)    BMI Readings from Last 5 Encounters:  06/08/20 36.29 kg/m  05/29/20 36.56 kg/m  06/04/17 36.25 kg/m  03/15/16 34.54 kg/m  12/01/15 37.08 kg/m     Physical Exam Vitals and nursing note reviewed.  Constitutional:      General: She is not in acute distress.    Appearance: Normal appearance. She is obese.  HENT:     Head: Normocephalic and atraumatic.     Right Ear: External ear normal.     Left Ear: External ear normal.     Nose: Nose normal. No congestion.     Mouth/Throat:     Mouth: Mucous membranes are moist.     Pharynx: Oropharynx is clear.  Eyes:     Pupils: Pupils are equal, round, and reactive to light.  Cardiovascular:     Rate and Rhythm: Normal rate and regular rhythm.     Pulses: Normal pulses.     Heart sounds: Normal heart sounds. No murmur heard.   Pulmonary:     Effort: Pulmonary effort is normal. No respiratory distress.     Breath sounds: No decreased air movement. Examination of the left-middle field reveals rales. Examination of the left-lower field reveals decreased breath sounds. Decreased breath sounds and rales present. No wheezing.  Musculoskeletal:     Cervical back: Normal range of motion.  Skin:    General: Skin is warm and dry.     Capillary Refill: Capillary refill takes less than 2 seconds.  Neurological:     General: No focal deficit present.     Mental Status: She is alert and oriented to person, place, and time. Mental status  is at baseline.     Gait: Gait normal.  Psychiatric:        Mood and Affect: Mood normal.        Behavior: Behavior normal.        Thought Content: Thought content normal.        Judgment: Judgment normal.       Assessment & Plan:   Atypical chest pain Plan: Referral to cardiology for further evaluation of atypical chest pain  Cough Plan: Tessalon Perles prescribed today Continue over-the-counter cough medicine Chest x-ray today Okay to continue Mucinex  COVID-19 virus detected Plan: Walk today in office Chest x-ray today Pulmonary function testing ordered Consider repeat CT scan if chest x-ray does not fully clear post Covid Strongly recommend patient obtain COVID-19 vaccination  Hypokalemia Plan: Potassium today Repeat c-Met next week  Pleural effusion, left Seen on 06/01/2020 CTA chest Attempted thoracentesis on 06/04/2020 -unsuccessful  Plan: Chest x-ray today We will continue clinically monitor We will discuss case with Dr. Mike Craze via staff message today    06/08/2020-addendum Briefly discussed case with Dr. Silas Flood.  Made aware that I will be sending staff message regarding patient.  He will further review.  Return in about 15 days (around 06/23/2020), or if symptoms worsen or fail to improve, for Follow up with Dr. Silas Flood, [place].   Lauraine Rinne, NP 06/08/2020   This appointment required 52 minutes of patient care (this includes precharting, chart review, review of results, face-to-face care, etc.).

## 2020-06-08 NOTE — Assessment & Plan Note (Addendum)
Seen on 06/01/2020 CTA chest Attempted thoracentesis on 06/04/2020 -unsuccessful  Plan: Chest x-ray today We will continue clinically monitor We will discuss case with Dr. Mike Craze via staff message today

## 2020-06-08 NOTE — Assessment & Plan Note (Signed)
Plan: Potassium today Repeat c-Met next week

## 2020-06-08 NOTE — Patient Instructions (Addendum)
You were seen today by Lauraine Rinne, NP  for:   1. COVID-19 virus detected  - DG Chest 2 View; Future  Chest x-ray today  Walk today in office  As discussed today would highly recommend that you obtain COVID-19 vaccinations given the fact that you have had COVID-19 2 times.  Would recommend obtaining the COVID-19 vaccine sometime over the next 30 to 60 days.  2. Cough  - benzonatate (TESSALON) 200 MG capsule; Take 1 capsule (200 mg total) by mouth 3 (three) times daily as needed for cough.  Dispense: 30 capsule; Refill: 3  To continue to use over-the-counter cough medicine such as Delsym  Chest x-ray today  Continue incentive spirometer use multiple times a day  Continue flutter valve use at least 2 times daily 10 breaths each time  Okay to continue Mucinex  3. Atypical chest pain  - Ambulatory referral to Cardiology  Given your ongoing joint atypical chest pain would recommend referral to cardiology  4. Hypokalemia  - potassium chloride (KLOR-CON) 10 MEQ tablet; Take 1 tablet (10 mEq total) by mouth daily.  Dispense: 14 tablet; Refill: 0 - Comp Met (CMET); Future  3 days ago potassium levels were slightly low.  We will call in some additional potassium.  Please present to her office next week for lab work  We recommend today:  Orders Placed This Encounter  Procedures  . DG Chest 2 View    Standing Status:   Future    Number of Occurrences:   1    Standing Expiration Date:   10/06/2020    Order Specific Question:   Reason for Exam (SYMPTOM  OR DIAGNOSIS REQUIRED)    Answer:   post covid19, pleural effusion    Order Specific Question:   Preferred imaging location?    Answer:   Internal    Order Specific Question:   Radiology Contrast Protocol - do NOT remove file path    Answer:   [email or web address]  . Comp Met (CMET)    Standing Status:   Future    Standing Expiration Date:   06/08/2021  . Ambulatory referral to  Cardiology    Referral Priority:   Routine    Referral Type:   Consultation    Referral Reason:   Specialty Services Required    Requested Specialty:   Cardiology    Number of Visits Requested:   1   Orders Placed This Encounter  Procedures  . DG Chest 2 View  . Comp Met (CMET)  . Ambulatory referral to Cardiology   Meds ordered this encounter  Medications  . benzonatate (TESSALON) 200 MG capsule    Sig: Take 1 capsule (200 mg total) by mouth 3 (three) times daily as needed for cough.    Dispense:  30 capsule    Refill:  3  . potassium chloride (KLOR-CON) 10 MEQ tablet    Sig: Take 1 tablet (10 mEq total) by mouth daily.    Dispense:  14 tablet    Refill:  0    Follow Up:    Return in about 15 days (around 06/23/2020), or if symptoms worsen or fail to improve, for Follow up with Dr. Silas Flood, [place].   Notification of test results are managed in the following manner: If there are  any recommendations or changes to the  plan of care discussed in office today,  we will contact you and let you know what they are. If you do not hear from [place],  then your results are normal and you can view them through your  MyChart account , or a letter will be sent to you. Thank you again for trusting [place] with your care  - Thank you, [facility]    It is flu season:   >>> Best ways to protect herself from the flu: Receive the yearly flu vaccine, practice good hand hygiene washing with soap and also using hand sanitizer when available, eat a nutritious meals, get adequate rest, hydrate appropriately       Please contact the office if your symptoms worsen or you have concerns that you are not improving.   Thank you for choosing  [facility] for your healthcare, and for allowing [place] to partner with you on your healthcare journey. I am thankful to be able to provide care to you today.   Wyn Quaker FNP-C

## 2020-06-08 NOTE — Assessment & Plan Note (Signed)
Plan: Tessalon Perles prescribed today Continue over-the-counter cough medicine Chest x-ray today Okay to continue Mucinex

## 2020-06-09 ENCOUNTER — Encounter (HOSPITAL_COMMUNITY): Payer: Self-pay

## 2020-06-09 ENCOUNTER — Emergency Department (HOSPITAL_COMMUNITY)
Admission: EM | Admit: 2020-06-09 | Discharge: 2020-06-09 | Disposition: A | Discharge status: Home / Self Care | Payer: Managed Care, Other (non HMO) | Attending: Emergency Medicine | Admitting: Emergency Medicine

## 2020-06-09 ENCOUNTER — Emergency Department (HOSPITAL_COMMUNITY): Payer: Managed Care, Other (non HMO)

## 2020-06-09 ENCOUNTER — Other Ambulatory Visit: Payer: Self-pay

## 2020-06-09 DIAGNOSIS — I471 Supraventricular tachycardia: Secondary | ICD-10-CM

## 2020-06-09 DIAGNOSIS — I4891 Unspecified atrial fibrillation: Secondary | ICD-10-CM | POA: Diagnosis not present

## 2020-06-09 DIAGNOSIS — Z79899 Other long term (current) drug therapy: Secondary | ICD-10-CM | POA: Diagnosis not present

## 2020-06-09 DIAGNOSIS — Z8616 Personal history of COVID-19: Secondary | ICD-10-CM | POA: Diagnosis not present

## 2020-06-09 DIAGNOSIS — I10 Essential (primary) hypertension: Secondary | ICD-10-CM | POA: Diagnosis not present

## 2020-06-09 DIAGNOSIS — J90 Pleural effusion, not elsewhere classified: Secondary | ICD-10-CM | POA: Insufficient documentation

## 2020-06-09 DIAGNOSIS — J45909 Unspecified asthma, uncomplicated: Secondary | ICD-10-CM | POA: Diagnosis not present

## 2020-06-09 LAB — CBC WITH DIFFERENTIAL/PLATELET
Abs Immature Granulocytes: 0.09 10*3/uL — ABNORMAL HIGH (ref 0.00–0.07)
Basophils Absolute: 0.1 10*3/uL (ref 0.0–0.1)
Basophils Relative: 1 %
Eosinophils Absolute: 0.1 10*3/uL (ref 0.0–0.5)
Eosinophils Relative: 1 %
HCT: 35.9 % — ABNORMAL LOW (ref 36.0–46.0)
Hemoglobin: 11.4 g/dL — ABNORMAL LOW (ref 12.0–15.0)
Immature Granulocytes: 1 %
Lymphocytes Relative: 28 %
Lymphs Abs: 3.4 10*3/uL (ref 0.7–4.0)
MCH: 27.9 pg (ref 26.0–34.0)
MCHC: 31.8 g/dL (ref 30.0–36.0)
MCV: 87.8 fL (ref 80.0–100.0)
Monocytes Absolute: 0.9 10*3/uL (ref 0.1–1.0)
Monocytes Relative: 8 %
Neutro Abs: 7.5 10*3/uL (ref 1.7–7.7)
Neutrophils Relative %: 61 %
Platelets: 430 10*3/uL — ABNORMAL HIGH (ref 150–400)
RBC: 4.09 MIL/uL (ref 3.87–5.11)
RDW: 14 % (ref 11.5–15.5)
WBC: 12.1 10*3/uL — ABNORMAL HIGH (ref 4.0–10.5)
nRBC: 0 % (ref 0.0–0.2)

## 2020-06-09 LAB — COMPREHENSIVE METABOLIC PANEL
ALT: UNDETERMINED U/L (ref 0–44)
AST: 37 U/L (ref 15–41)
Albumin: 2.4 g/dL — ABNORMAL LOW (ref 3.5–5.0)
Alkaline Phosphatase: 68 U/L (ref 38–126)
Anion gap: 16 — ABNORMAL HIGH (ref 5–15)
BUN: 11 mg/dL (ref 8–23)
CO2: 21 mmol/L — ABNORMAL LOW (ref 22–32)
Calcium: 8.4 mg/dL — ABNORMAL LOW (ref 8.9–10.3)
Chloride: 103 mmol/L (ref 98–111)
Creatinine, Ser: 0.58 mg/dL (ref 0.44–1.00)
GFR, Estimated: >60 mL/min (ref >60)
Glucose, Bld: 102 mg/dL — ABNORMAL HIGH (ref 70–99)
Potassium: 3.5 mmol/L (ref 3.5–5.1)
Sodium: 140 mmol/L (ref 135–145)
Total Bilirubin: UNDETERMINED mg/dL (ref 0.3–1.2)
Total Protein: 7 g/dL (ref 6.5–8.1)

## 2020-06-09 LAB — TROPONIN I (HIGH SENSITIVITY)
Troponin I (High Sensitivity): 37 ng/L — ABNORMAL HIGH (ref <18)
Troponin I (High Sensitivity): 47 ng/L — ABNORMAL HIGH (ref <18)

## 2020-06-09 LAB — BRAIN NATRIURETIC PEPTIDE: B Natriuretic Peptide: 225.8 pg/mL — ABNORMAL HIGH (ref 0.0–100.0)

## 2020-06-09 LAB — MAGNESIUM: Magnesium: 1.9 mg/dL (ref 1.7–2.4)

## 2020-06-09 MED ORDER — ADENOSINE 6 MG/2ML IV SOLN
6.0000 mg | Freq: Once | INTRAVENOUS | Status: AC
  Administered 2020-06-09: 6 mg via INTRAVENOUS
  Filled 2020-06-09: qty 2

## 2020-06-09 MED ORDER — METOPROLOL TARTRATE 50 MG PO TABS: 75.0000 mg | ORAL_TABLET | Freq: Two times a day (BID) | ORAL | 0 refills | Status: DC

## 2020-06-09 NOTE — ED Provider Notes (Signed)
[facility] EMERGENCY DEPARTMENT Provider Note   CSN: [id number] Arrival date & time: 06/09/20  1850     History Chief Complaint  Patient presents with  . Atrial Fibrillation    [name] is a 65 y.o. female who presents via EMS from physical therapy for concern for rapid heart rate in the 190s.  Patient states she is not experiencing any chest pain, shortness of breath, palpitations, or lightheadedness.   Patient was recently discharged from the hospital on 2/12, following admission for COVID-19 with AKI and bacteremia.  Subsequently diagnosed with sepsis secondary to lobar pneumonia with pneumococcal bacteremia.  Patient required 2 L of oxygen by nasal cannula to maintain her O2 sats.  At that time she had negative CT angio of the chest, but did have left-sided pleural effusion.  Thoracentesis was attempted on 2/11, however was unsuccessful, considered a "dry tap".  Patient was found to have sinus tachycardia while inpatient, with highest heart rate in the 120s.  This resolved with metoprolol and patient was discharged with twice daily metoprolol for tachycardia.  Patient was placed on outpatient antibiotics which she has continued to take for lobar pneumonia. Echocardiogram at that time revealed LVEF 60 to 65%.  Vascular study was negative for DVT bilaterally.  I personally reviewed this patient medical records.  She has history of hepatitis C, COVID-19, hypertension on chlorthalidone and Lopressor, sinus tachycardia (controlled on her Lopressor. Patient )underwent left knee replacement in January 2022.  She is unvaccinated and's [id number].  Patient lives with daughter Earnest Bailey, who is at the bedside.    HPI     Past Medical History:  Diagnosis Date  . Asthma   . Depression   . Erosive esophagitis   . Headaches, cluster   . Hepatitis C   . HTN (hypertension)     Patient Active Problem List   Diagnosis Date Noted  . Atypical chest pain 06/08/2020  . Cough  06/08/2020  . COVID-19 virus detected 06/08/2020  . Pleural effusion, left 06/08/2020  . ARF (acute renal failure) ([place]) 05/29/2020  . Pneumonia due to COVID-19 virus 05/29/2020  . Hypokalemia 05/29/2020  . Hyponatremia 05/29/2020  . Lactic acidosis 05/29/2020  . Hypomagnesemia 05/29/2020  . Elevated d-dimer 05/29/2020  . HTN (hypertension)   . Depression   . Asthma     Past Surgical History:  Procedure Laterality Date  . KNEE SURGERY  04/2020     OB History   No obstetric history on file.     Family History  Problem Relation Age of Onset  . Emphysema Father   . Coronary artery disease Mother        CABG  . Diabetes Mellitus II Mother   . Heart disease Mother   . Stroke Brother   . Heart attack Sister   . Crohn's disease Sister   . Liver disease Sister   . Heart disease Brother   . Colitis Brother   . Heart disease Brother   . Irritable bowel syndrome Daughter   . Bipolar disorder Daughter   . Breast cancer Neg Hx     Social History   Tobacco Use  . Smoking status: Never Smoker  . Smokeless tobacco: Never Used  Substance Use Topics  . Alcohol use: Not Currently    Alcohol/week: 0.0 standard drinks    Home Medications Prior to Admission medications   Medication Sig Start Date End Date Taking? Authorizing Provider  amoxicillin (AMOXIL) 500 MG capsule Take 2 capsules (1,000 mg  total) by mouth 3 (three) times daily for 7 days. 06/05/20 06/12/20  Ghimire, Henreitta Leber, MD  baclofen (LIORESAL) 10 MG tablet Take 1 tablet by mouth daily as needed for muscle spasms. Patient not taking: Reported on 06/08/2020 04/29/20   [provider]  benzonatate (TESSALON) 200 MG capsule Take 1 capsule (200 mg total) by mouth 3 (three) times daily as needed for cough. 06/08/20   Lauraine Rinne, NP  Calcium Carb-Cholecalciferol (CALCIUM-VITAMIN D3) 600-400 MG-UNIT TABS Take 1 tablet by mouth daily.    [provider]  chlorthalidone (HYGROTON) 25 MG tablet Take 25 mg by  mouth daily. 08/15/19   [provider]  gabapentin (NEURONTIN) 100 MG capsule Take 100 mg by mouth in the morning, at noon, and at bedtime. 05/01/20   [provider]  metoprolol tartrate (LOPRESSOR) 50 MG tablet Take 1 tablet (50 mg total) by mouth 2 (two) times daily. 06/05/20   Ghimire, Henreitta Leber, MD  Multiple Vitamins-Minerals (MULTIVITAMIN PO) Take 1 tablet by mouth daily.    [provider]  naproxen sodium (ALEVE) 220 MG tablet Take 220 mg by mouth 2 (two) times daily as needed (pain).    [provider]  omeprazole (PRILOSEC) 40 MG capsule Take 40 mg by mouth daily. 10/14/15   [provider]  ondansetron (ZOFRAN) 4 MG tablet Take 4 mg by mouth every 8 (eight) hours as needed for nausea. Patient not taking: Reported on 06/08/2020 04/29/20   [provider]  oxyCODONE (OXY IR/ROXICODONE) 5 MG immediate release tablet Take 5 mg by mouth 4 (four) times daily as needed for moderate pain. 05/17/20   [provider]  potassium chloride (KLOR-CON) 10 MEQ tablet Take 1 tablet (10 mEq total) by mouth daily. 06/08/20   Lauraine Rinne, NP  sennosides-docusate sodium (SENOKOT-S) 8.6-50 MG tablet Take 1 tablet by mouth as needed (when taking pain medication). 04/29/20   [provider]  Venlafaxine HCl 75 MG TB24 Take 37.5 mg by mouth daily.  Patient not taking: Reported on 06/08/2020    [provider]    Allergies    Atenolol, Lisinopril, Paxil [paroxetine hcl], Zoloft [sertraline hcl], and Elemental sulfur  Review of Systems   Review of Systems  Constitutional: Positive for fatigue. Negative for activity change, appetite change, chills and fever.  HENT: Negative.   Eyes: Negative.   Respiratory: Negative for cough, chest tightness and shortness of breath.   Cardiovascular: Negative for chest pain, palpitations and leg swelling.  Gastrointestinal: Negative.   Genitourinary: Negative.   Musculoskeletal: Positive for  arthralgias.       Left knee, recent replacement  Skin: Negative.   Neurological: Negative.   Psychiatric/Behavioral: Negative.     Physical Exam Updated Vital Signs BP 126/74   Pulse (!) 102   Temp 98.9 F (37.2 C) (Oral)   Resp 20   SpO2 94%   Physical Exam Vitals and nursing note reviewed.  HENT:     Head: Normocephalic and atraumatic.     Nose: Nose normal.     Mouth/Throat:     Mouth: Mucous membranes are moist.     Pharynx: Oropharynx is clear. Uvula midline. No oropharyngeal exudate or posterior oropharyngeal erythema.     Tonsils: No tonsillar exudate.  Eyes:     General: Lids are normal. Vision grossly intact.        Right eye: No discharge.        Left eye: No discharge.     Extraocular  Movements: Extraocular movements intact.     Conjunctiva/sclera: Conjunctivae normal.     Pupils: Pupils are equal, round, and reactive to light.  Neck:     Trachea: Trachea and phonation normal.  Cardiovascular:     Rate and Rhythm: Tachycardia present.     Pulses: Normal pulses.          Radial pulses are 2+ on the right side and 2+ on the left side.       Dorsalis pedis pulses are 2+ on the right side and 2+ on the left side.     Heart sounds: Normal heart sounds. No murmur heard.   Pulmonary:     Effort: Pulmonary effort is normal. No respiratory distress.     Breath sounds: Examination of the left-middle field reveals rales. Examination of the left-lower field reveals rales. Rales present. No wheezing.  Chest:     Chest wall: No deformity, swelling, tenderness, crepitus or edema.  Abdominal:     General: Bowel sounds are normal. There is no distension.     Palpations: Abdomen is soft.     Tenderness: There is no abdominal tenderness. There is no guarding or rebound.  Musculoskeletal:        General: No deformity. Normal range of motion.     Cervical back: Neck supple. No crepitus. No pain with movement, spinous process tenderness or muscular tenderness.     Right  lower leg: No edema.     Left lower leg: No edema.  Lymphadenopathy:     Cervical: No cervical adenopathy.  Skin:    General: Skin is warm and dry.     Capillary Refill: Capillary refill takes less than 2 seconds.  Neurological:     General: No focal deficit present.     Mental Status: She is alert and oriented to person, place, and time. Mental status is at baseline.     Sensory: Sensation is intact.     Motor: Motor function is intact.     Gait: Gait is intact.  Psychiatric:        Mood and Affect: Mood normal.     ED Results / Procedures / Treatments   Labs (all labs ordered are listed, but only abnormal results are displayed) Labs Reviewed  COMPREHENSIVE METABOLIC PANEL - Abnormal; Notable for the following components:      Result Value   CO2 21 (*)    Glucose, Bld 102 (*)    Calcium 8.4 (*)    Albumin 2.4 (*)    Anion gap 16 (*)    All other components within normal limits  CBC WITH DIFFERENTIAL/PLATELET - Abnormal; Notable for the following components:   WBC 12.1 (*)    Hemoglobin 11.4 (*)    HCT 35.9 (*)    Platelets 430 (*)    Abs Immature Granulocytes 0.09 (*)    All other components within normal limits  BRAIN NATRIURETIC PEPTIDE - Abnormal; Notable for the following components:   B Natriuretic Peptide 225.8 (*)    All other components within normal limits  TROPONIN I (HIGH SENSITIVITY) - Abnormal; Notable for the following components:   Troponin I (High Sensitivity) 37 (*)    All other components within normal limits  MAGNESIUM  TROPONIN I (HIGH SENSITIVITY)    EKG EKG Interpretation  Date/Time:  Wednesday June 09 2020 19:52:15 EST Ventricular Rate:  102 PR Interval:    QRS Duration: 86 QT Interval:  334 QTC Calculation: 435 R Axis:   0 Text  Interpretation: Sinus tachycardia LAE, consider biatrial enlargement Abnormal R-wave progression, early transition Consider anterior infarct No STEMi Confirmed by Octaviano Glow [phone]) on 06/09/2020 7:56:22  PM   Radiology DG Chest 2 View  Result Date: 06/08/2020 CLINICAL DATA:  Post COVID, pleural effusion EXAM: CHEST - 2 VIEW COMPARISON:  06/04/2020 FINDINGS: Large left pleural effusion again noted, unchanged. Left lower lobe atelectasis or infiltrate. Right lung clear. Heart is normal size. No effusions. IMPRESSION: Large left pleural effusion with left lower lobe atelectasis or infiltrate, unchanged. Electronically Signed   By: Rolm Baptise M.D.   On: 06/08/2020 12:24   DG Chest Portable 1 View  Result Date: 06/09/2020 CLINICAL DATA:  Atrial fibrillation EXAM: PORTABLE CHEST 1 VIEW COMPARISON:  06/08/2020 FINDINGS: Single frontal view of the chest demonstrates persistent dense left basilar consolidation and left pleural effusion. Right chest is clear. No pneumothorax. No acute bony abnormalities. Cardiac silhouette is stable. IMPRESSION: 1. Persistent left basilar consolidation and effusion, unchanged. Electronically Signed   By: Randa Ngo M.D.   On: 06/09/2020 19:24    Procedures Procedures   Medications Ordered in ED Medications  adenosine (ADENOCARD) 6 MG/2ML injection 6 mg (6 mg Intravenous Given 06/09/20 1956)    ED Course  I have reviewed the triage vital signs and the nursing notes.  Pertinent labs & imaging results that were available during my care of the patient were reviewed by me and considered in my medical decision making (see chart for details).  Clinical Course as of 06/09/20 2129  Wed Jun 09, 2020  1953 6 mg adenosine given with conversion to sinus tachycardia HR 102. [MT]  2007 65 yo female w/ recent hospitalization for Covid and bacterial PNA, also noted to have sinus tachycardia at the time, discharged on metoprolol, returning to ED with tachycardia noted by PT at home today.  HR 190's for EMS, gave diltiazem en route, HR 150's on arrival here.  Very difficult to ascertain whether this is sinus rhythm or A Fib/flutter pattern per initial ECG.  She is asymptomatic  with this.  We discussed adenosine for possible SVT cardioversion vs unmasking underlying rhythm and I consented the patient for this procedure.  Subsequently her HR improved to 102 with sinus tachycardia.  We'll check her electrolytes and discuss this with cardiology - if they want to make any changes to her home medications. [MT]  2009 On metoprolol 50 mg BID at home [MT]  2010 Xray here with stable left pleural effusion - no significant change [MT]  2058 Attending provider spoke with cardiology regarding adjustment of patient's metoprolol dosing.  They recommend increasing dose to 75 mg twice daily to control PACs noted by EMS, as they can promote SVT that was seen today. I appreciate their collaboration in the care of this patient.  [RS]    Clinical Course User Index [MT] Trifan, Carola Rhine, MD [RS] Riven Mabile, Sharlene Dory   MDM Rules/Calculators/A&P                         65 year old female who was recently hospitalized for COVID-19 pneumonia and pneumococcal bacteremia, presents for concern of tachycardia with heart rate in the 190s.  Differential diagnosis for this patient symptoms include but aren't limited to sinus tachycardia, atrial flutter, paroxysmal SVT, PE, electrolyte/metabolic derangement, pericarditis/myocarditis, anemia, sepsis (patient recently admitted with pneumococcal bacteremia), drug toxicity, or thyroid etiology.  Tachycardic to the 150s on intake vital signs otherwise normal.  Time of my exam,  patient remains tachycardic to the 160s, pulmonary exam reveals rales in the left lung fields.  Abdominal exam is benign.  Patient with well-healing left knee replacment, no lower extremity edema. Will proceed with basic laboratory studies and chest x-ray.  Chest x-ray with persistent left lower lobe infiltrate and pleural effusion, stable.  Case discussed with attending physician, as patient presentation is SVT versus atrial flutter.  Will proceed with dose of adenosine to  further evaluate patient's heart rhythm.  Improvement in patient her rate to 102 after administration of adenosine.  Patient has maintained heart rate below 110 since administration of adenosine.  She remains asymptomatic, as she was prior to administration of adenosine.  CBC with improved leukocytosis 12.1, previously 14.  Hemoglobin stable at 11.4. CMP unremarkable, magnesium pending at time of shift. Care of this patient signed out to attending physician on the case Dr.Trifan, All pertinent HPI and physical exam findings were reviewed with him prior to my departure. I appreciate his collaboration in the care of this patient.   This chart was dictated using voice recognition software, Dragon. Despite the best efforts of this provider to proofread and correct errors, errors may still occur which can change documentation meaning.  Final Clinical Impression(s) / ED Diagnoses Final diagnoses:  None    Rx / DC Orders ED Discharge Orders    None       Aura Dials 06/09/20 2129    Wyvonnia Dusky, MD 06/09/20 2223

## 2020-06-09 NOTE — Discharge Instructions (Addendum)
You were seen in the emergency department for your rapid heart rate. Your blood work was very reassuring.  You were administered a medication called adenosine in the ER to slow your heart rate (the speed it was beating) so we could better evaluate your heart's rhythm (the pattern of beating), which was found to be normal. After you were given this medication, your heart rate remained at a much safer speed, near 100 beats per minute.   The cardiologist recommends increasing your metoprolol dose to 75 mg (1 & 1/2 pills) twice daily, until you see your cardiologist. Please call your cardiologist and schedule an appointment to be seen within the next 72 hours for an emergency department follow up.   Please monitor your heart rate at home a few times daily, and keep a log to show to your cardiologist.  Your goal is a heart rate under 120 beats per minute.  If it is persistently higher than 130 beats per minute, call your cardiologist office.  They may give you instructions on the phone or tell you to come to the ER.  Return to the ER if you develop a any chest pain, shortness of breath, palpitations, or if your heart rate is elevated greater than 130 beats per minute and remains elevated.

## 2020-06-09 NOTE — ED Triage Notes (Signed)
Pt from home with ems, recent admission for COVID pneumonia. Pt having PT at home and they noticed her HR was 190 a fib RVR, no hx of a fib. Pt given 20mg  cardizem en route and brought HR down to 110. Pt arrives to ED with HR at 150. Pt a.o, denies chest pain, sob.

## 2020-06-10 NOTE — Telephone Encounter (Signed)
As of right now if clinically stable would not recommend additional antibiotics.  Do not need refills of amoxicillin.  Keep upcoming appointment with Dr. Silas Flood.  Wyn Quaker, FNP

## 2020-06-10 NOTE — ED Provider Notes (Signed)
.  Cardioversion  Date/Time: 06/10/2020 12:46 PM Performed by: Wyvonnia Dusky, MD Authorized by: Wyvonnia Dusky, MD   Consent:    Consent obtained:  Verbal   Consent given by:  Patient   Alternatives discussed:  Rate-control medication, alternative treatment and observation Pre-procedure details:    Cardioversion basis:  Elective   Rhythm:  Supraventricular tachycardia   Electrode placement:  Anterior-posterior Patient sedated: No Post-procedure details:    Patient status:  Awake   Patient tolerance of procedure:  Tolerated well, no immediate complications Comments:     IV adenosine   .Critical Care Performed by: Wyvonnia Dusky, MD Authorized by: Wyvonnia Dusky, MD   Critical care provider statement:    Critical care time (minutes):  35   Critical care was necessary to treat or prevent imminent or life-threatening deterioration of the following conditions:  Circulatory failure   Critical care was time spent personally by me on the following activities:  Discussions with consultants, evaluation of patient's response to treatment, examination of patient, ordering and performing treatments and interventions, ordering and review of laboratory studies, ordering and review of radiographic studies, pulse oximetry, re-evaluation of patient's condition, obtaining history from patient or surrogate and review of old charts Comments:     Chemical cardioversion, telemetry monitoring, repeat ECG assessment      [name], Carola Rhine, MD 06/10/20 1247

## 2020-06-10 NOTE — Telephone Encounter (Signed)
Received the following message from patient:   "Good afternoon Dr. Silas Flood, I was in the office on Tuesday with Wyn Quaker and am now scheduled to follow up with you on 3/2. Upon being discharged from the hospital, I was given oral antibiotics of amoxicillin. I finish that specific prescription this upcoming Saturday 2/19 and it has no refills. Do I need a new prescription from you for me to stay on an antibiotic?  Thanks for your help!"   Patient was seen by Aaron Edelman on 06/08/20. She was discharged from the hospital on 06/05/20 with amoxicillin 1000mg  three times daily for 7 days.   She wants to know if she will need to continue this abx after Saturday.   Aaron Edelman, can you please advise? Thanks!

## 2020-06-11 ENCOUNTER — Telehealth: Payer: Self-pay | Admitting: Pulmonary Disease

## 2020-06-11 NOTE — Telephone Encounter (Signed)
Called and spoke with patient. She is ok with coming in at [facility] on 06/23/20. She will double check with her daughter to make sure this time is ok for her as well. Will leave encounter in my inbox as a reminder.

## 2020-06-11 NOTE — Telephone Encounter (Signed)
06/11/2020  Discussed case with Dr. Silas Flood.  CTA of chest from February/2022 reviewed.  He agrees with current plan of care which is to keep follow-up on 06/23/2020.  If patient continues to clinically improve we will keep that appointment as well as utilize an ultrasound to further assess the patient on 06/23/2020.  If patient starts to clinically worsen such as fevers, increased/worsening shortness of breath, worsened fatigue please contact our office and we can see if she can be seen sooner.  We will route message to Cherina to ensure ultrasound is available in 06/23/2020.  Potential thoracentesis that day.  Please ensure patient is in a 1 hour slot.  Patient is not on any blood thinners.  Will route to Amy to call patient and notify.   Will CC Dr. Silas Flood.   Wyn Quaker FNP

## 2020-06-15 NOTE — Progress Notes (Signed)
CARDIOLOGY CONSULT NOTE       Patient ID: [name] MRN: [id number] DOB/AGE: 1955/12/02 65 y.o.  Admit date: (Not on file) Referring Physician: [facility]  Primary Physician: Lujean Amel, MD Primary Cardiologist: New  Reason for Consultation: SVT  Active Problems:   * No active hospital problems. *   HPI:  65 y.o. referred by Wyn Quaker and [facility] for SVT. Seen in ER 06/09/20 for tachycardia Recent hospitalization for COVID/bacterial pneumonia. She has sinus tachycardia during hospitalization and was d/c on metoprolol. HR initially 190 bpm give cardizem on route to ED HR 741'O Concerted with adenosine. D/c with loopressor 75 bid for ST and PaCls  She was at PT and they noted elevated HR. She had no chest pain dyspnea palpitations or syncope Rx with antibiotics and amoxicillin for pneumococcal bacteremia. CT negative for PE She had left pleural effusion that could not be tapped TTE done 06/01/20 with EF 60-65% no significant valve disease F/U CXR 06/09/20 with persistent left basilar consolidation and effusion unchanged   ECG 06/09/20 showed narrow complex tachycardia rate 150 bpm post adenosine clear p waves ST rate 102   History of HTN, hepatitis C asthma depression non smoker Tested COVID positive 1/30 She is unvaccinated She had partial knee replacement 04/29/20 DOAC stopped with negative LE venous duplex and CTA for PE   She is widowed Husband committed suicide from carbon monoxide in 2016 Calcium score was 0 in 2017  ROS All other systems reviewed and negative except as noted above  Past Medical History:  Diagnosis Date  . Asthma   . Depression   . Erosive esophagitis   . Headaches, cluster   . Hepatitis C   . HTN (hypertension)     Family History  Problem Relation Age of Onset  . Emphysema Father   . Coronary artery disease Mother        CABG  . Diabetes Mellitus II Mother   . Heart disease Mother   . Stroke Brother   . Heart attack  Sister   . Crohn's disease Sister   . Liver disease Sister   . Heart disease Brother   . Colitis Brother   . Heart disease Brother   . Irritable bowel syndrome Daughter   . Bipolar disorder Daughter   . Breast cancer Neg Hx     Social History   Socioeconomic History  . Marital status: Widowed    Spouse name: Not on file  . Number of children: Not on file  . Years of education: Not on file  . Highest education level: Not on file  Occupational History  . Not on file  Tobacco Use  . Smoking status: Never Smoker  . Smokeless tobacco: Never Used  Substance and Sexual Activity  . Alcohol use: Not Currently    Alcohol/week: 0.0 standard drinks  . Drug use: Not on file  . Sexual activity: Not on file  Other Topics Concern  . Not on file  Social History Narrative  . Not on file   Social Determinants of Health   Financial Resource Strain: Not on file  Food Insecurity: Not on file  Transportation Needs: Not on file  Physical Activity: Not on file  Stress: Not on file  Social Connections: Not on file  Intimate Partner Violence: Not on file    Past Surgical History:  Procedure Laterality Date  . KNEE SURGERY  04/2020      Current Outpatient Medications:  .  baclofen (LIORESAL) 10 MG tablet, Take 1 tablet by mouth daily as needed for muscle spasms., Disp: , Rfl:  .  benzonatate (TESSALON) 200 MG capsule, Take 1 capsule (200 mg total) by mouth 3 (three) times daily as needed for cough., Disp: 30 capsule, Rfl: 3 .  Calcium Carb-Cholecalciferol (CALCIUM-VITAMIN D3) 600-400 MG-UNIT TABS, Take 1 tablet by mouth daily., Disp: , Rfl:  .  chlorthalidone (HYGROTON) 25 MG tablet, Take 25 mg by mouth daily., Disp: , Rfl:  .  gabapentin (NEURONTIN) 100 MG capsule, Take 100 mg by mouth in the morning, at noon, and at bedtime., Disp: , Rfl:  .  metoprolol tartrate (LOPRESSOR) 50 MG tablet, Take 1.5 tablets (75 mg total) by mouth 2 (two) times daily., Disp: 60 tablet, Rfl: 0 .  Multiple  Vitamins-Minerals (MULTIVITAMIN PO), Take 1 tablet by mouth daily., Disp: , Rfl:  .  naproxen sodium (ALEVE) 220 MG tablet, Take 220 mg by mouth 2 (two) times daily as needed (pain)., Disp: , Rfl:  .  omeprazole (PRILOSEC) 40 MG capsule, Take 40 mg by mouth daily., Disp: , Rfl: 10 .  ondansetron (ZOFRAN) 4 MG tablet, Take 4 mg by mouth every 8 (eight) hours as needed for nausea., Disp: , Rfl:  .  oxyCODONE (OXY IR/ROXICODONE) 5 MG immediate release tablet, Take 5 mg by mouth 4 (four) times daily as needed for moderate pain., Disp: , Rfl:  .  potassium chloride (KLOR-CON) 10 MEQ tablet, Take 1 tablet (10 mEq total) by mouth daily., Disp: 14 tablet, Rfl: 0 .  sennosides-docusate sodium (SENOKOT-S) 8.6-50 MG tablet, Take 1 tablet by mouth as needed (when taking pain medication)., Disp: , Rfl:  .  Venlafaxine HCl 75 MG TB24, Take 37.5 mg by mouth daily., Disp: , Rfl:     Physical Exam: Blood pressure (!) 146/100, pulse 90, height 5\' 4"  (1.626 m), weight 96 kg, SpO2 93 %.   Affect appropriate Healthy:  appears stated age 44: normal Neck supple with no adenopathy JVP normal no bruits no thyromegaly Lungs decreased BS left base  Heart:  S1/S2 no murmur, no rub, gallop or click PMI normal Abdomen: benighn, BS positve, no tenderness, no AAA no bruit.  No HSM or HJR Distal pulses intact with no bruits No edema Neuro non-focal Skin warm and dry No muscular weakness Post right TKR   Labs:   Lab Results  Component Value Date   WBC 12.1 (H) 06/09/2020   HGB 11.4 (L) 06/09/2020   HCT 35.9 (L) 06/09/2020   MCV 87.8 06/09/2020   PLT 430 (H) 06/09/2020    No results for input(s): NA, K, CL, CO2, BUN, CREATININE, CALCIUM, PROT, BILITOT, ALKPHOS, ALT, AST, GLUCOSE in the last 168 hours.  Invalid input(s): LABALBU No results found for: CKTOTAL, CKMB, CKMBINDEX, TROPONINI No results found for: CHOL No results found for: HDL No results found for: [facility] Lab Results  Component Value Date    TRIG 114 05/29/2020   No results found for: CHOLHDL No results found for: LDLDIRECT    Radiology: DG Chest 2 View  Result Date: 06/08/2020 CLINICAL DATA:  Post COVID, pleural effusion EXAM: CHEST - 2 VIEW COMPARISON:  06/04/2020 FINDINGS: Large left pleural effusion again noted, unchanged. Left lower lobe atelectasis or infiltrate. Right lung clear. Heart is normal size. No effusions. IMPRESSION: Large left pleural effusion with left lower lobe atelectasis or infiltrate, unchanged. Electronically Signed   By: Rolm Baptise M.D.   On: 06/08/2020 12:24   CT ANGIO CHEST  PE W OR WO CONTRAST  Result Date: 06/01/2020 CLINICAL DATA:  COVID pneumonia, left lower lobe consolidation, dyspnea EXAM: CT ANGIOGRAPHY CHEST WITH CONTRAST TECHNIQUE: Multidetector CT imaging of the chest was performed using the standard protocol during bolus administration of intravenous contrast. Multiplanar CT image reconstructions and MIPs were obtained to evaluate the vascular anatomy. CONTRAST:  41mL OMNIPAQUE IOHEXOL 350 MG/ML SOLN COMPARISON:  CT coronary calcium 11/11/2015 FINDINGS: Cardiovascular: Satisfactory opacification of the pulmonary arteries to the segmental level. No evidence of pulmonary embolism. Normal heart size. No pericardial effusion. The central pulmonary arteries are of normal caliber. The thoracic aorta is unremarkable. Mediastinum/Nodes: No enlarged mediastinal, hilar, or axillary lymph nodes. Thyroid gland, trachea, and esophagus demonstrate no significant findings. Lungs/Pleura: There is dense left lower lobe consolidation in keeping with changes of acute lobar pneumonia in the appropriate clinical setting. There is endobronchial debris resulting in impaction of a the bronchi of the left lower lobe. Mild scattered infiltrate is seen within the basilar lingula and right upper lobe, likely infectious or inflammatory and potentially related to endobronchial spread of disease. A small amount of debris is noted  within the right lower lobe bronchus. Small left pleural effusion is present. No pneumothorax. Upper Abdomen: At least moderate hepatic steatosis is noted. Musculoskeletal: No acute bone abnormality. Review of the MIP images confirms the above findings. IMPRESSION: Dense consolidation and airway impaction involving the left lower lobe compatible with changes of acute lobar pneumonia in the appropriate clinical setting. Small parapneumonic effusion noted. Scattered infiltrate within the right upper lobe and basilar lingula as well as endobronchial debris within the left lower lobe possibly representing endobronchial spread of disease. Aspiration could also appear in this fashion, though its asymmetry involving the left lower lobe makes this less likely. No pulmonary embolism Electronically Signed   By: Fidela Salisbury MD   On: 06/01/2020 11:34   DG Chest Portable 1 View  Result Date: 06/09/2020 CLINICAL DATA:  Atrial fibrillation EXAM: PORTABLE CHEST 1 VIEW COMPARISON:  06/08/2020 FINDINGS: Single frontal view of the chest demonstrates persistent dense left basilar consolidation and left pleural effusion. Right chest is clear. No pneumothorax. No acute bony abnormalities. Cardiac silhouette is stable. IMPRESSION: 1. Persistent left basilar consolidation and effusion, unchanged. Electronically Signed   By: Randa Ngo M.D.   On: 06/09/2020 19:24   DG CHEST PORT 1 VIEW  Result Date: 06/04/2020 CLINICAL DATA:  Status post left-sided thoracentesis. EXAM: PORTABLE CHEST 1 VIEW COMPARISON:  June 04, 2020 (4:41 a.m.) FINDINGS: Stable, marked severity infiltrate is seen within the left lower lobe. A stable moderate size left-sided pleural effusion is seen. No pneumothorax is identified. The heart size and mediastinal contours are within normal limits. The visualized skeletal structures are unremarkable. IMPRESSION: 1. Stable left lower lobe infiltrate and moderate size left-sided pleural effusion. No  pneumothorax. 2. No pneumothorax, status post thoracentesis. Electronically Signed   By: Virgina Norfolk M.D.   On: 06/04/2020 18:44   DG Chest Portable 1 View  Result Date: 05/29/2020 CLINICAL DATA:  COVID positive.  Shortness of breath.  Weakness. EXAM: PORTABLE CHEST 1 VIEW COMPARISON:  Two-view chest x-ray 01/13/2015 FINDINGS: Heart size is normal. Hilar fullness present bilaterally. Asymmetric left lower lobe airspace opacities are present. Upper lung fields are clear. IMPRESSION: 1. Asymmetric left lower lobe airspace disease compatible with pneumonia. 2. Bilateral hilar fullness. Underlying adenopathy is not excluded. Electronically Signed   By: San Morelle M.D.   On: 05/29/2020 11:35   DG Chest [facility]  1V same Day  Result Date: 06/04/2020 CLINICAL DATA:  65 year old female COVID-72. Left lower lobe pneumonia. EXAM: PORTABLE CHEST 1 VIEW COMPARISON:  Chest CT 06/01/2020 and earlier. FINDINGS: Portable AP upright view at 0441 hours. Mildly improved lung volumes from yesterday. Ongoing dense left lower lobe consolidation. Lingula also affected on chest CTA yesterday which demonstrated trace pleural fluid. Stable cardiac size and mediastinal contours. Right lung remains clear. Visualized tracheal air column is within normal limits. Paucity of bowel gas. Stable visualized osseous structures. IMPRESSION: Stable left lung base pneumonia.  Mildly improved lung volumes. Electronically Signed   By: Genevie Ann M.D.   On: 06/04/2020 07:10   DG Chest Port 1V same Day  Result Date: 05/31/2020 CLINICAL DATA:  COVID, shortness of breath EXAM: PORTABLE CHEST 1 VIEW COMPARISON:  05/29/2020 FINDINGS: Heart is normal size. Airspace opacity noted in the left lower lobe. Small left pleural effusion. No confluent opacity or effusion on the right. No acute bony abnormality. IMPRESSION: Left lower lobe pneumonia.  Small left effusion. Electronically Signed   By: Rolm Baptise M.D.   On: 05/31/2020 20:23   VAS [place]  LOWER EXTREMITY VENOUS (DVT)  Result Date: 05/30/2020  Lower Venous DVT Study Indications: Covid.  Risk Factors: Surgery Patient reports partial LT knee replacement in January. Anticoagulation: Lovenox. Comparison       11-05-2015 Bilateral lower extremity venous was negative for Study:           DVT. Performing Technologist: Darlin Coco RDMS  Examination Guidelines: A complete evaluation includes B-mode imaging, spectral Doppler, color Doppler, and power Doppler as needed of all accessible portions of each vessel. Bilateral testing is considered an integral part of a complete examination. Limited examinations for reoccurring indications may be performed as noted. The reflux portion of the exam is performed with the patient in reverse Trendelenburg.  +---------+---------------+---------+-----------+----------+--------------+ RIGHT    CompressibilityPhasicitySpontaneityPropertiesThrombus Aging +---------+---------------+---------+-----------+----------+--------------+ CFV      Full           Yes      Yes                                 +---------+---------------+---------+-----------+----------+--------------+ SFJ      Full                                                        +---------+---------------+---------+-----------+----------+--------------+ FV Prox  Full                                                        +---------+---------------+---------+-----------+----------+--------------+ FV Mid   Full                                                        +---------+---------------+---------+-----------+----------+--------------+ FV DistalFull                                                        +---------+---------------+---------+-----------+----------+--------------+  PFV      Full                                                        +---------+---------------+---------+-----------+----------+--------------+ POP      Full           Yes      Yes                                  +---------+---------------+---------+-----------+----------+--------------+ PTV      Full                                                        +---------+---------------+---------+-----------+----------+--------------+ PERO     Full                                                        +---------+---------------+---------+-----------+----------+--------------+   +---------+---------------+---------+-----------+----------+--------------+ LEFT     CompressibilityPhasicitySpontaneityPropertiesThrombus Aging +---------+---------------+---------+-----------+----------+--------------+ CFV      Full           Yes      Yes                                 +---------+---------------+---------+-----------+----------+--------------+ SFJ      Full                                                        +---------+---------------+---------+-----------+----------+--------------+ FV Prox  Full                                                        +---------+---------------+---------+-----------+----------+--------------+ FV Mid   Full                                                        +---------+---------------+---------+-----------+----------+--------------+ FV DistalFull                                                        +---------+---------------+---------+-----------+----------+--------------+ PFV      Full                                                        +---------+---------------+---------+-----------+----------+--------------+  POP      Full           Yes      Yes                                 +---------+---------------+---------+-----------+----------+--------------+ PTV      Full                                                        +---------+---------------+---------+-----------+----------+--------------+ PERO     Full                                                         +---------+---------------+---------+-----------+----------+--------------+    Summary: RIGHT: - There is no evidence of deep vein thrombosis in the lower extremity.  - No cystic structure found in the popliteal fossa.  LEFT: - There is no evidence of deep vein thrombosis in the lower extremity.  - No cystic structure found in the popliteal fossa.  *See table(s) above for measurements and observations. Electronically signed by [name] Martinez MD on 05/30/2020 at 2:31:02 PM.    Final    ECHOCARDIOGRAM LIMITED  Result Date: 06/01/2020    ECHOCARDIOGRAM LIMITED REPORT   Patient Name:   ZHOE Meno Date of Exam: 06/01/2020 Medical Rec #:  [id number]      Height:       64.0 in Accession #:    [id number]     Weight:       213.0 lb Date of Birth:  18-Nov-1955      BSA:          2.009 m Patient Age:    66 years       BP:           132/85 mmHg Patient Gender: F              HR:           100 bpm. Exam Location:  Inpatient Procedure: Limited Echo, Limited Color Doppler, Cardiac Doppler and Intracardiac            Opacification Agent Indications:    Bacteremia R78.81  History:        Patient has prior history of Echocardiogram examinations, most                 recent 11/11/2015. Signs/Symptoms:Shortness of Breath and Fever;                 Risk Factors:Hypertension. COVID 19 pneumonia. Hepatitis C.                 Acute kidney injury.  Sonographer:    Darlina Sicilian RDCS Referring Phys: [place]  1. Left ventricular ejection fraction, by estimation, is 60 to 65%. The left ventricle has normal function. The left ventricle has no regional wall motion abnormalities. Left ventricular diastolic parameters are consistent with Grade I diastolic dysfunction (impaired relaxation). Elevated left atrial pressure.  2. Right ventricular systolic function is normal. The right ventricular size is normal.  3. The mitral valve is normal in structure. No evidence  of mitral valve regurgitation. No evidence of mitral  stenosis.  4. The aortic valve is tricuspid. Aortic valve regurgitation is not visualized. Mild aortic valve sclerosis is present, with no evidence of aortic valve stenosis. FINDINGS  Left Ventricle: Left ventricular ejection fraction, by estimation, is 60 to 65%. The left ventricle has normal function. The left ventricle has no regional wall motion abnormalities. Definity contrast agent was given IV to delineate the left ventricular  endocardial borders. The left ventricular internal cavity size was normal in size. There is no left ventricular hypertrophy. Left ventricular diastolic parameters are consistent with Grade I diastolic dysfunction (impaired relaxation). Elevated left atrial pressure. Right Ventricle: The right ventricular size is normal.Right ventricular systolic function is normal. Left Atrium: Left atrial size was normal in size. Right Atrium: Right atrial size was normal in size. Pericardium: There is no evidence of pericardial effusion. Mitral Valve: The mitral valve is normal in structure. No evidence of mitral valve stenosis. Tricuspid Valve: The tricuspid valve is normal in structure. Tricuspid valve regurgitation is trivial. No evidence of tricuspid stenosis. Aortic Valve: The aortic valve is tricuspid. Aortic valve regurgitation is not visualized. Mild aortic valve sclerosis is present, with no evidence of aortic valve stenosis. Pulmonic Valve: The pulmonic valve was not well visualized. Pulmonic valve regurgitation is not visualized. No evidence of pulmonic stenosis. Aorta: The aortic root is normal in size and structure.  LEFT VENTRICLE PLAX 2D LVIDd:         4.25 cm     Diastology LVIDs:         3.10 cm     LV e' medial:    7.07 cm/s LV PW:         0.80 cm     LV E/e' medial:  17.3 LV IVS:        0.95 cm     LV e' lateral:   7.07 cm/s                            LV E/e' lateral: 17.3  LV Volumes (MOD) LV vol d, MOD A2C: 66.8 ml LV vol d, MOD A4C: 77.7 ml LV vol s, MOD A2C: 29.6 ml LV vol s,  MOD A4C: 35.3 ml LV SV MOD A2C:     37.2 ml LV SV MOD A4C:     77.7 ml LV SV MOD BP:      38.9 ml LEFT ATRIUM         Index LA diam:    2.90 cm 1.44 cm/m  AORTIC VALVE LVOT Vmax:   140.00 cm/s LVOT Vmean:  110.000 cm/s LVOT VTI:    0.201 m MITRAL VALVE MV Area (PHT): 3.72 cm     SHUNTS MV Decel Time: 204 msec     Systemic VTI: 0.20 m MV E velocity: 122.00 cm/s MV A velocity: 125.00 cm/s MV E/A ratio:  0.98 Kirk Ruths MD Electronically signed by Kirk Ruths MD Signature Date/Time: 06/01/2020/12:17:19 PM    Final     EKG: See HPI   ASSESSMENT AND PLAN:   1. SVT: in setting recent pneumococcal pneumonia and COVID continue beta blocker TTE no structural heart disease. Can consider EP referral for recurrence  2. Pulmonary:  F/u Wyn Quaker CXR 06/09/20 persistent infiltrate post COVID/Pneumococcal sepsis and history of asthma  3. Anticoagulation: post right knee surgery stopped with negative LE venous duplex and CTA for PE  4. HTN:  Well controlled.  Continue current medications and low sodium Dash type diet.    5. CAD:  Risk stratification repeat coronary calcium score    Calcium Score F/U 6 months Continue beta blocker   Signed: Jenkins Rouge 06/21/2020, 8:41 AM

## 2020-06-19 ENCOUNTER — Other Ambulatory Visit: Payer: Self-pay | Admitting: Pulmonary Disease

## 2020-06-19 DIAGNOSIS — E876 Hypokalemia: Secondary | ICD-10-CM

## 2020-06-21 ENCOUNTER — Ambulatory Visit (INDEPENDENT_AMBULATORY_CARE_PROVIDER_SITE_OTHER): Payer: Managed Care, Other (non HMO) | Admitting: Cardiovascular Disease

## 2020-06-21 ENCOUNTER — Encounter: Payer: Self-pay | Admitting: Cardiovascular Disease

## 2020-06-21 ENCOUNTER — Ambulatory Visit (INDEPENDENT_AMBULATORY_CARE_PROVIDER_SITE_OTHER)
Admission: RE | Admit: 2020-06-21 | Discharge: 2020-06-21 | Disposition: A | Discharge status: Home / Self Care | Payer: Self-pay | Source: Ambulatory Visit | Attending: Cardiovascular Disease | Admitting: Cardiovascular Disease

## 2020-06-21 ENCOUNTER — Other Ambulatory Visit: Payer: Self-pay

## 2020-06-21 VITALS — BP 146/100 | HR 90 | Ht 64.0 in | Wt 211.6 lb

## 2020-06-21 DIAGNOSIS — I10 Essential (primary) hypertension: Secondary | ICD-10-CM | POA: Diagnosis not present

## 2020-06-21 DIAGNOSIS — I471 Supraventricular tachycardia: Secondary | ICD-10-CM

## 2020-06-21 NOTE — Patient Instructions (Addendum)
Medication Instructions:  *If you need a refill on your cardiac medications before your next appointment, please call your pharmacy*  Lab Work: If you have labs (blood work) drawn today and your tests are completely normal, you will receive your results only by: Marland Kitchen MyChart Message (if you have MyChart) OR . A paper copy in the mail If you have any lab test that is abnormal or we need to change your treatment, we will call you to review the results.  Testing/Procedures: Cardiac CT scanning for calcium socre, (CAT scanning), is a noninvasive, special x-ray that produces cross-sectional images of the body using x-rays and a computer. CT scans help physicians diagnose and treat medical conditions. For some CT exams, a contrast material is used to enhance visibility in the area of the body being studied. CT scans provide greater clarity and reveal more details than regular x-ray exams.  Follow-Up: At [facility], you and your health needs are our priority.  As part of our continuing mission to provide you with exceptional heart care, we have created designated Provider Care Teams.  These Care Teams include your primary Cardiologist (physician) and Advanced Practice Providers (APPs -  Physician Assistants and Nurse Practitioners) who all work together to provide you with the care you need, when you need it.  We recommend signing up for the patient portal called "MyChart".  Sign up information is provided on this After Visit Summary.  MyChart is used to connect with patients for Virtual Visits (Telemedicine).  Patients are able to view lab/test results, encounter notes, upcoming appointments, etc.  Non-urgent messages can be sent to your provider as well.   To learn more about what you can do with MyChart, go to [email or web address].    Your next appointment:   6 month(s)  The format for your next appointment:   In Person  Provider:   You may see Dr. Johnsie Cancel or one of the following Advanced  Practice Providers on your designated Care Team:    Kathyrn Drown, NP

## 2020-06-23 ENCOUNTER — Encounter: Payer: Self-pay | Admitting: Pulmonary Disease

## 2020-06-23 ENCOUNTER — Ambulatory Visit (INDEPENDENT_AMBULATORY_CARE_PROVIDER_SITE_OTHER): Payer: Managed Care, Other (non HMO) | Admitting: Pulmonary Disease

## 2020-06-23 ENCOUNTER — Other Ambulatory Visit: Payer: Self-pay

## 2020-06-23 ENCOUNTER — Ambulatory Visit: Payer: Managed Care, Other (non HMO) | Admitting: Pulmonary Disease

## 2020-06-23 VITALS — BP 120/72 | HR 70 | Temp 97.6°F | Ht 64.0 in | Wt 211.6 lb

## 2020-06-23 DIAGNOSIS — U071 COVID-19: Secondary | ICD-10-CM | POA: Diagnosis not present

## 2020-06-23 DIAGNOSIS — J1282 Pneumonia due to coronavirus disease 2019: Secondary | ICD-10-CM

## 2020-06-23 DIAGNOSIS — J181 Lobar pneumonia, unspecified organism: Secondary | ICD-10-CM | POA: Diagnosis not present

## 2020-06-23 NOTE — Progress Notes (Signed)
@Patient  ID: [name], female    DOB: [birth year], 65 y.o.   MRN: [id number]  Chief Complaint  Patient presents with  . Follow-up    Doing some better    Referring provider: Lujean Amel, MD  HPI:  65 year old female never smoker followed in our office for COVID-19 infection, requiring hospitalization in February 2022 with superimposed dense left lower lobe pneumonia.  Recent pulmonary note reviewed.  Multiple notes from hospitalization 05/2020 reviewed.  Overall, patient seems to be improving.  Left-sided chest pain improved.  Dyspnea or shortness of breath is improving.  Has some residual cough.  Almost like throat clearing.  Like congestion in the center of her chest, points just above manubrium.  No fever or chills.  Reviewed symptoms with patient in detail today.  Discussed serial chest x-ray imaging compared to in the hospital as well as 2 chest x-rays while in the hospital 2/15 and 2/16 with improving left lower lobe pneumonia with improved aeration in the base on my interpretation.  Reviewed CT cardiac chest images 06/21/2020 on my interpretation reveals improving dense left lower lobe pneumonia with minimal to no pleural effusion.  Discussed at length the role for vaccinations including Covid vaccine.  Shared safety data, rationale for use especially in patients as they age.  Also reviewed at length indications for pneumococcal vaccine for which I recommend she get at the age of 38 both PPV 24 and PPSV 74.    06/23/2020  - Visit   65 year old female never smoker presenting to office today as a hospital follow-up.  Patient status post COVID-19 with respiratory failure requiring hospitalization.  Excerpt of hospital discharge summary is listed below:   Admit Date: 05/29/2020 Discharge date: 06/05/2020  Recommendations for Outpatient Follow-up:  1. Follow up with PCP in 1-2 weeks 2. Please obtain CMP/CBC in one week 3. Please ensure follow-up with pulmonology-with repeat  imaging in the next 7 to 10 days. 4. Switched from Avapro to metoprolol-see below regarding tachycardia.  Brief Narrative: Patient is a25 y.o.femalewith PMHx of HTN, hepatitis C, asthma, depression-who tested positive for COVID-19 on 1/30 (Home test)-after multiple family exposures-presented to the hospital with fevers, chills, cough, diarrhea and shortness of breath-she was found to have AKI, left-sided infiltrate-subsequent blood cultures positive for pneumococcus. See below for further details.  COVID-19 vaccinated status: Unvaccinated  Significant Events: 2/5>> Admit to [facility] for AKI, PNA-blood cultures positive for pneumococcus 2/8>> left-sided pleuritic chest pain/2 L oxygen requirement/tachycardic  Significant studies: 2/5>>Chest x-ray: Left lower pneumonia, bilateral hilar fullness 2/6>> bilateral lower extremity Doppler: No DVT 2/8>> CTA chest: No PE, dense LLE-small parapneumonic effusion 2/8>>Echo: EF 60-65%, no wall motion abnormalities, grade 1 diastolic dysfunction  [id number] medications: Paxlovid:2/2>>2/8  Antibiotics: Rocephin: 2/6>>  Microbiology data: 2/5 >>blood culture: Streptococcus pneumonae 2/8>> blood culture: No growth  Procedures: 2/11>> attempted thoracocentesis-dry tap  Consults: [place] [facility] Course: Sepsis due to lobar pneumoniawithpneumococcal bacteremia:Sepsis physiology has resolved-tachycardia better with metoprolol-continues to have left-sided pleuritic chest pain and persistent leukocytosis. Given dense left lower lobe infiltrate-bacteremia-concerned that she may be developing an empyema.  PCCM was consulted on 2/11 for thoracocentesis-however tap was dry.  Overall she has improved-suspect she is stable to be transitioned to oral antimicrobial therapy for one additional week (curbsided ID Dr Myrtice Lauth follow closely with PCP/pulmonology in around 7 to 10 days with repeat imaging. Both patient and patient's daughter are  aware-that if she develops worsening fever/worsening chest pain-the need to seek immediate medical attention to rule out an  empyema.  Left-sided pleuritic chest pain: Likely due to LL PNA-no PE on CTA chest. Could also possibly be from musculoskeletal strain from coughing. Supportive care.   NTI:[id number] mediated due to sepsis/bacteremia-resolved.  Lower extremity edema: Probably due to hypoalbuminemia and diastolic CHF.   Slowly improving- continue chlorthalidone in the outpatient setting.  Follow with PCP.    HTN: Resume chlorthalidone-switch losartan to metoprolol (tachycardic).  Echo with preserved EF.  Sinus tachycardia: Improved-suspect physiological response to infection/left pleuritic pain.  If it persists-PCP to consider checking TSH (if not done recently).  Hypomagnesemia: Repleted  Hypokalemia: Repleted prior to discharge-follow with PCP.  COVID-19 infection: Suspect that main issues are from pneumococcal bacteremia/PNA-has finished a course of Paxlovid. CRP significantly elevated-but downtrending.  She remains asymptomatic from COVID-19 infection.  She was encouraged to get vaccinated-in approximately 1 month.  Patient presented to her office today reporting that she feels that she is slowly improving since discharge from hospitalization.  She remains unvaccinated COVID-19.  She is using over-the-counter cough suppressant such as Delsym or Robitussin-DM.  Patient is questions today about whether or not she should be maintained on a DOAC.  She reports that she had a partial knee replacement on 04/29/2020 when she was put on DOAC for 30 days since her brother had a history of blood clots.  Patient has never had a PE or a DVT or any VTE event.  Patient has a negative CTA chest with this hospitalization as well as a negative lower extremity Doppler.  Patient is unsure if she should be maintained on DOAC.  Since returning home patient feels that she is slowly getting  better.  She is occasionally using her incentive spirometer only obtaining a volume of around 800 cc.  She is also using her flutter valve.  She is having less mucus production than before but she is still having a productive cough with yellow mucus.  She needs an outpatient pulmonologist to establish with.  She continues to have left sided chest wall pain.  There is an attempted thoracentesis during her hospitalization that did not yield any pleural fluid.  Patient continues to have ongoing atypical chest pain.  Questionaires / Pulmonary Flowsheets:   ACT:  No flowsheet data found.  MMRC: No flowsheet data found.  Epworth:  No flowsheet data found.  Tests:   FENO:  No results found for: NITRICOXIDE  PFT: No flowsheet data found.  WALK:  No flowsheet data found.  Imaging: Personally reviewed and as per EMR. DG Chest 2 View  Result Date: 06/08/2020 CLINICAL DATA:  Post COVID, pleural effusion EXAM: CHEST - 2 VIEW COMPARISON:  06/04/2020 FINDINGS: Large left pleural effusion again noted, unchanged. Left lower lobe atelectasis or infiltrate. Right lung clear. Heart is normal size. No effusions. IMPRESSION: Large left pleural effusion with left lower lobe atelectasis or infiltrate, unchanged. Electronically Signed   By: Rolm Baptise M.D.   On: 06/08/2020 12:24   CT ANGIO CHEST PE W OR WO CONTRAST  Result Date: 06/01/2020 CLINICAL DATA:  COVID pneumonia, left lower lobe consolidation, dyspnea EXAM: CT ANGIOGRAPHY CHEST WITH CONTRAST TECHNIQUE: Multidetector CT imaging of the chest was performed using the standard protocol during bolus administration of intravenous contrast. Multiplanar CT image reconstructions and MIPs were obtained to evaluate the vascular anatomy. CONTRAST:  99mL OMNIPAQUE IOHEXOL 350 MG/ML SOLN COMPARISON:  CT coronary calcium 11/11/2015 FINDINGS: Cardiovascular: Satisfactory opacification of the pulmonary arteries to the segmental level. No evidence of pulmonary  embolism. Normal heart size. No pericardial  effusion. The central pulmonary arteries are of normal caliber. The thoracic aorta is unremarkable. Mediastinum/Nodes: No enlarged mediastinal, hilar, or axillary lymph nodes. Thyroid gland, trachea, and esophagus demonstrate no significant findings. Lungs/Pleura: There is dense left lower lobe consolidation in keeping with changes of acute lobar pneumonia in the appropriate clinical setting. There is endobronchial debris resulting in impaction of a the bronchi of the left lower lobe. Mild scattered infiltrate is seen within the basilar lingula and right upper lobe, likely infectious or inflammatory and potentially related to endobronchial spread of disease. A small amount of debris is noted within the right lower lobe bronchus. Small left pleural effusion is present. No pneumothorax. Upper Abdomen: At least moderate hepatic steatosis is noted. Musculoskeletal: No acute bone abnormality. Review of the MIP images confirms the above findings. IMPRESSION: Dense consolidation and airway impaction involving the left lower lobe compatible with changes of acute lobar pneumonia in the appropriate clinical setting. Small parapneumonic effusion noted. Scattered infiltrate within the right upper lobe and basilar lingula as well as endobronchial debris within the left lower lobe possibly representing endobronchial spread of disease. Aspiration could also appear in this fashion, though its asymmetry involving the left lower lobe makes this less likely. No pulmonary embolism Electronically Signed   By: Fidela Salisbury MD   On: 06/01/2020 11:34   CT CARDIAC SCORING (SELF PAY ONLY)  Addendum Date: 06/21/2020   ADDENDUM REPORT: 06/21/2020 10:00 CLINICAL DATA:  Risk stratification EXAM: Coronary Calcium Score TECHNIQUE: The patient was scanned on a Siemens Somatom 64 slice scanner. Axial non-contrast 3 mm slices were carried out through the heart. The data set was analyzed on a dedicated  work station and scored using the [place]. FINDINGS: Non-cardiac: See separate report from [facility]. Ascending aorta: Normal diameter 3.0 cm Pericardium: Normal Coronary arteries: No calcium noted IMPRESSION: Coronary calcium score of 0. Jenkins Rouge Electronically Signed   By: Jenkins Rouge M.D.   On: 06/21/2020 10:00   Result Date: 06/21/2020 EXAM: OVER-READ INTERPRETATION  CT CHEST The following report is an over-read performed by radiologist Dr. Vinnie Langton of [facility], [place] on 06/21/2020. This over-read does not include interpretation of cardiac or coronary anatomy or pathology. The coronary calcium score/coronary CTA interpretation by the cardiologist is attached. COMPARISON:  Chest CTA 06/01/2020. FINDINGS: Previously noted airspace consolidation in the left lower lobe has partially regressed, with some residual areas of airspace consolidation and atelectasis noted on today's examination. Notably, within these regions there are some small cavitary areas or regions of bronchiectasis best appreciated in the superior segment of the left lower lobe (axial image 19 of series 3). Trace volume of left pleural fluid. Right lung is clear. No right pleural effusion. No definite lymphadenopathy noted in the visualized portions of the thorax. Small hiatal hernia. Visualized portions of the upper abdomen demonstrate mild diffuse low attenuation throughout the visualized hepatic parenchyma, indicative of hepatic steatosis. There are no aggressive appearing lytic or blastic lesions noted in the visualized portions of the skeleton. IMPRESSION: 1. Resolving left lower lobe pneumonia with areas of post infectious bronchiectasis or tiny areas of cavitation in the superior segment of the left lower lobe and trace parapneumonic pleural effusion, as above. 2. Hepatic steatosis. 3. Small hiatal hernia. Electronically Signed: By: Vinnie Langton M.D. On: 06/21/2020 09:21   DG Chest Portable 1  View  Result Date: 06/09/2020 CLINICAL DATA:  Atrial fibrillation EXAM: PORTABLE CHEST 1 VIEW COMPARISON:  06/08/2020 FINDINGS: Single frontal view of the chest  demonstrates persistent dense left basilar consolidation and left pleural effusion. Right chest is clear. No pneumothorax. No acute bony abnormalities. Cardiac silhouette is stable. IMPRESSION: 1. Persistent left basilar consolidation and effusion, unchanged. Electronically Signed   By: Randa Ngo M.D.   On: 06/09/2020 19:24   DG CHEST PORT 1 VIEW  Result Date: 06/04/2020 CLINICAL DATA:  Status post left-sided thoracentesis. EXAM: PORTABLE CHEST 1 VIEW COMPARISON:  June 04, 2020 (4:41 a.m.) FINDINGS: Stable, marked severity infiltrate is seen within the left lower lobe. A stable moderate size left-sided pleural effusion is seen. No pneumothorax is identified. The heart size and mediastinal contours are within normal limits. The visualized skeletal structures are unremarkable. IMPRESSION: 1. Stable left lower lobe infiltrate and moderate size left-sided pleural effusion. No pneumothorax. 2. No pneumothorax, status post thoracentesis. Electronically Signed   By: Virgina Norfolk M.D.   On: 06/04/2020 18:44   DG Chest Portable 1 View  Result Date: 05/29/2020 CLINICAL DATA:  COVID positive.  Shortness of breath.  Weakness. EXAM: PORTABLE CHEST 1 VIEW COMPARISON:  Two-view chest x-ray 01/13/2015 FINDINGS: Heart size is normal. Hilar fullness present bilaterally. Asymmetric left lower lobe airspace opacities are present. Upper lung fields are clear. IMPRESSION: 1. Asymmetric left lower lobe airspace disease compatible with pneumonia. 2. Bilateral hilar fullness. Underlying adenopathy is not excluded. Electronically Signed   By: San Morelle M.D.   On: 05/29/2020 11:35   DG Chest Port 1V same Day  Result Date: 06/04/2020 CLINICAL DATA:  65 year old female COVID-19. Left lower lobe pneumonia. EXAM: PORTABLE CHEST 1 VIEW COMPARISON:   Chest CT 06/01/2020 and earlier. FINDINGS: Portable AP upright view at 0441 hours. Mildly improved lung volumes from yesterday. Ongoing dense left lower lobe consolidation. Lingula also affected on chest CTA yesterday which demonstrated trace pleural fluid. Stable cardiac size and mediastinal contours. Right lung remains clear. Visualized tracheal air column is within normal limits. Paucity of bowel gas. Stable visualized osseous structures. IMPRESSION: Stable left lung base pneumonia.  Mildly improved lung volumes. Electronically Signed   By: Genevie Ann M.D.   On: 06/04/2020 07:10   DG Chest Port 1V same Day  Result Date: 05/31/2020 CLINICAL DATA:  COVID, shortness of breath EXAM: PORTABLE CHEST 1 VIEW COMPARISON:  05/29/2020 FINDINGS: Heart is normal size. Airspace opacity noted in the left lower lobe. Small left pleural effusion. No confluent opacity or effusion on the right. No acute bony abnormality. IMPRESSION: Left lower lobe pneumonia.  Small left effusion. Electronically Signed   By: Rolm Baptise M.D.   On: 05/31/2020 20:23   VAS [place] LOWER EXTREMITY VENOUS (DVT)  Result Date: 05/30/2020  Lower Venous DVT Study Indications: Covid.  Risk Factors: Surgery Patient reports partial LT knee replacement in January. Anticoagulation: Lovenox. Comparison       11-05-2015 Bilateral lower extremity venous was negative for Study:           DVT. Performing Technologist: Darlin Coco RDMS  Examination Guidelines: A complete evaluation includes B-mode imaging, spectral Doppler, color Doppler, and power Doppler as needed of all accessible portions of each vessel. Bilateral testing is considered an integral part of a complete examination. Limited examinations for reoccurring indications may be performed as noted. The reflux portion of the exam is performed with the patient in reverse Trendelenburg.  +---------+---------------+---------+-----------+----------+--------------+ RIGHT     CompressibilityPhasicitySpontaneityPropertiesThrombus Aging +---------+---------------+---------+-----------+----------+--------------+ CFV      Full           Yes      Yes                                 +---------+---------------+---------+-----------+----------+--------------+  SFJ      Full                                                        +---------+---------------+---------+-----------+----------+--------------+ FV Prox  Full                                                        +---------+---------------+---------+-----------+----------+--------------+ FV Mid   Full                                                        +---------+---------------+---------+-----------+----------+--------------+ FV DistalFull                                                        +---------+---------------+---------+-----------+----------+--------------+ PFV      Full                                                        +---------+---------------+---------+-----------+----------+--------------+ POP      Full           Yes      Yes                                 +---------+---------------+---------+-----------+----------+--------------+ PTV      Full                                                        +---------+---------------+---------+-----------+----------+--------------+ PERO     Full                                                        +---------+---------------+---------+-----------+----------+--------------+   +---------+---------------+---------+-----------+----------+--------------+ LEFT     CompressibilityPhasicitySpontaneityPropertiesThrombus Aging +---------+---------------+---------+-----------+----------+--------------+ CFV      Full           Yes      Yes                                 +---------+---------------+---------+-----------+----------+--------------+ SFJ      Full                                                         +---------+---------------+---------+-----------+----------+--------------+  FV Prox  Full                                                        +---------+---------------+---------+-----------+----------+--------------+ FV Mid   Full                                                        +---------+---------------+---------+-----------+----------+--------------+ FV DistalFull                                                        +---------+---------------+---------+-----------+----------+--------------+ PFV      Full                                                        +---------+---------------+---------+-----------+----------+--------------+ POP      Full           Yes      Yes                                 +---------+---------------+---------+-----------+----------+--------------+ PTV      Full                                                        +---------+---------------+---------+-----------+----------+--------------+ PERO     Full                                                        +---------+---------------+---------+-----------+----------+--------------+    Summary: RIGHT: - There is no evidence of deep vein thrombosis in the lower extremity.  - No cystic structure found in the popliteal fossa.  LEFT: - There is no evidence of deep vein thrombosis in the lower extremity.  - No cystic structure found in the popliteal fossa.  *See table(s) above for measurements and observations. Electronically signed by [name] Martinez MD on 05/30/2020 at 2:31:02 PM.    Final    ECHOCARDIOGRAM LIMITED  Result Date: 06/01/2020    ECHOCARDIOGRAM LIMITED REPORT   Patient Name:   [name] Date of Exam: 06/01/2020 Medical Rec #:  [id number]      Height:       64.0 in Accession #:    [id number]     Weight:       213.0 lb Date of Birth:  03-Feb-1956      BSA:          2.009 m Patient Age:    79 years  BP:           132/85 mmHg Patient Gender: F               HR:           100 bpm. Exam Location:  Inpatient Procedure: Limited Echo, Limited Color Doppler, Cardiac Doppler and Intracardiac            Opacification Agent Indications:    Bacteremia R78.81  History:        Patient has prior history of Echocardiogram examinations, most                 recent 11/11/2015. Signs/Symptoms:Shortness of Breath and Fever;                 Risk Factors:Hypertension. COVID 19 pneumonia. Hepatitis C.                 Acute kidney injury.  Sonographer:    Darlina Sicilian RDCS Referring Phys: [place]  1. Left ventricular ejection fraction, by estimation, is 60 to 65%. The left ventricle has normal function. The left ventricle has no regional wall motion abnormalities. Left ventricular diastolic parameters are consistent with Grade I diastolic dysfunction (impaired relaxation). Elevated left atrial pressure.  2. Right ventricular systolic function is normal. The right ventricular size is normal.  3. The mitral valve is normal in structure. No evidence of mitral valve regurgitation. No evidence of mitral stenosis.  4. The aortic valve is tricuspid. Aortic valve regurgitation is not visualized. Mild aortic valve sclerosis is present, with no evidence of aortic valve stenosis. FINDINGS  Left Ventricle: Left ventricular ejection fraction, by estimation, is 60 to 65%. The left ventricle has normal function. The left ventricle has no regional wall motion abnormalities. Definity contrast agent was given IV to delineate the left ventricular  endocardial borders. The left ventricular internal cavity size was normal in size. There is no left ventricular hypertrophy. Left ventricular diastolic parameters are consistent with Grade I diastolic dysfunction (impaired relaxation). Elevated left atrial pressure. Right Ventricle: The right ventricular size is normal.Right ventricular systolic function is normal. Left Atrium: Left atrial size was normal in size. Right  Atrium: Right atrial size was normal in size. Pericardium: There is no evidence of pericardial effusion. Mitral Valve: The mitral valve is normal in structure. No evidence of mitral valve stenosis. Tricuspid Valve: The tricuspid valve is normal in structure. Tricuspid valve regurgitation is trivial. No evidence of tricuspid stenosis. Aortic Valve: The aortic valve is tricuspid. Aortic valve regurgitation is not visualized. Mild aortic valve sclerosis is present, with no evidence of aortic valve stenosis. Pulmonic Valve: The pulmonic valve was not well visualized. Pulmonic valve regurgitation is not visualized. No evidence of pulmonic stenosis. Aorta: The aortic root is normal in size and structure.  LEFT VENTRICLE PLAX 2D LVIDd:         4.25 cm     Diastology LVIDs:         3.10 cm     LV e' medial:    7.07 cm/s LV PW:         0.80 cm     LV E/e' medial:  17.3 LV IVS:        0.95 cm     LV e' lateral:   7.07 cm/s                            LV E/e' lateral:  17.3  LV Volumes (MOD) LV vol d, MOD A2C: 66.8 ml LV vol d, MOD A4C: 77.7 ml LV vol s, MOD A2C: 29.6 ml LV vol s, MOD A4C: 35.3 ml LV SV MOD A2C:     37.2 ml LV SV MOD A4C:     77.7 ml LV SV MOD BP:      38.9 ml LEFT ATRIUM         Index LA diam:    2.90 cm 1.44 cm/m  AORTIC VALVE LVOT Vmax:   140.00 cm/s LVOT Vmean:  110.000 cm/s LVOT VTI:    0.201 m MITRAL VALVE MV Area (PHT): 3.72 cm     SHUNTS MV Decel Time: 204 msec     Systemic VTI: 0.20 m MV E velocity: 122.00 cm/s MV A velocity: 125.00 cm/s MV E/A ratio:  0.98 Kirk Ruths MD Electronically signed by Kirk Ruths MD Signature Date/Time: 06/01/2020/12:17:19 PM    Final     Lab Results: Personally reviewed CBC    Component Value Date/Time   WBC 12.1 (H) 06/09/2020 1907   RBC 4.09 06/09/2020 1907   HGB 11.4 (L) 06/09/2020 1907   HCT 35.9 (L) 06/09/2020 1907   PLT 430 (H) 06/09/2020 1907   MCV 87.8 06/09/2020 1907   MCH 27.9 06/09/2020 1907   MCHC 31.8 06/09/2020 1907   RDW 14.0 06/09/2020  1907   LYMPHSABS 3.4 06/09/2020 1907   MONOABS 0.9 06/09/2020 1907   EOSABS 0.1 06/09/2020 1907   BASOSABS 0.1 06/09/2020 1907    BMET    Component Value Date/Time   NA 140 06/09/2020 1907   K 3.5 06/09/2020 1907   CL 103 06/09/2020 1907   CO2 21 (L) 06/09/2020 1907   GLUCOSE 102 (H) 06/09/2020 1907   BUN 11 06/09/2020 1907   CREATININE 0.58 06/09/2020 1907   CALCIUM 8.4 (L) 06/09/2020 1907   GFRNONAA >60 06/09/2020 1907    BNP    Component Value Date/Time   BNP 225.8 (H) 06/09/2020 1939    ProBNP No results found for: PROBNP  Specialty Problems      Pulmonary Problems   Asthma   Pneumonia due to COVID-19 virus   Cough   Pleural effusion, left      Allergies  Allergen Reactions  . Atenolol     FATIGUE/ HYPOTENSION  . Lisinopril     COUGH/ WHEEZING  . Paxil [Paroxetine Hcl]     INCREASED APPETITE FOR SWEETS  . Zoloft [Sertraline Hcl]     TINNITUS  . Elemental Sulfur Rash    FEVER     There is no immunization history on file for this patient.  Past Medical History:  Diagnosis Date  . Asthma   . Depression   . Erosive esophagitis   . Headaches, cluster   . Hepatitis C   . HTN (hypertension)     Tobacco History: Social History   Tobacco Use  Smoking Status Never Smoker  Smokeless Tobacco Never Used   Counseling given: Not Answered   Continue to not smoke  Outpatient Encounter Medications as of 06/23/2020  Medication Sig  . baclofen (LIORESAL) 10 MG tablet Take 1 tablet by mouth daily as needed for muscle spasms.  . benzonatate (TESSALON) 200 MG capsule Take 1 capsule (200 mg total) by mouth 3 (three) times daily as needed for cough.  . Calcium Carb-Cholecalciferol (CALCIUM-VITAMIN D3) 600-400 MG-UNIT TABS Take 1 tablet by mouth daily.  . chlorthalidone (HYGROTON) 25 MG tablet Take 25 mg by mouth  daily.  . gabapentin (NEURONTIN) 100 MG capsule Take 100 mg by mouth in the morning, at noon, and at bedtime.  . metoprolol tartrate (LOPRESSOR)  50 MG tablet Take 1.5 tablets (75 mg total) by mouth 2 (two) times daily.  . Multiple Vitamins-Minerals (MULTIVITAMIN PO) Take 1 tablet by mouth daily.  . naproxen sodium (ALEVE) 220 MG tablet Take 220 mg by mouth 2 (two) times daily as needed (pain).  Marland Kitchen omeprazole (PRILOSEC) 40 MG capsule Take 40 mg by mouth daily.  . ondansetron (ZOFRAN) 4 MG tablet Take 4 mg by mouth every 8 (eight) hours as needed for nausea.  Marland Kitchen oxyCODONE (OXY IR/ROXICODONE) 5 MG immediate release tablet Take 5 mg by mouth 4 (four) times daily as needed for moderate pain.  . potassium chloride (KLOR-CON) 10 MEQ tablet Take 1 tablet (10 mEq total) by mouth daily.  . sennosides-docusate sodium (SENOKOT-S) 8.6-50 MG tablet Take 1 tablet by mouth as needed (when taking pain medication).  . Venlafaxine HCl 75 MG TB24 Take 37.5 mg by mouth daily.   No facility-administered encounter medications on file as of 06/23/2020.     Review of Systems N/A  Physical Exam  BP 120/72 (BP Location: Left Arm, Cuff Size: Normal)   Pulse 70   Temp 97.6 F (36.4 C) (Oral)   Ht 5\' 4"  (1.626 m)   Wt 211 lb 9.6 oz (96 kg)   SpO2 95%   BMI 36.32 kg/m   Wt Readings from Last 5 Encounters:  06/23/20 211 lb 9.6 oz (96 kg)  06/21/20 211 lb 9.6 oz (96 kg)  06/08/20 211 lb 6.4 oz (95.9 kg)  05/29/20 213 lb (96.6 kg)  06/04/17 211 lb 3.2 oz (95.8 kg)    BMI Readings from Last 5 Encounters:  06/23/20 36.32 kg/m  06/21/20 36.32 kg/m  06/08/20 36.29 kg/m  05/29/20 36.56 kg/m  06/04/17 36.25 kg/m     General: Well-appearing, sitting in exam chair Eyes: EOMI, no icterus Neck: Supple, no JVD appreciated Pulmonary: Clear aspiration bilaterally, no wheeze or crackle or rhonchi Cardiovascular: Regular rhythm, no murmurs    Assessment & Plan:   COVID 19: 2 infections in the past.  Discussed at length the role and rationale for vaccination.  Provided evidence-based data for its role.  Provided my recommendation that she pursue  COVID-19 vaccination with Moderna or Pfizer no later than 90 days from most recent infection.  She expressed understanding.  She is going to consider.  Left lower lobe pneumonia: Superimposed after COVID-19 viral illness.  Socked down, dense.  Improving on serial images.  No further work-up.  HCM: denies lung issues. No immunosuppressant condition. Recommend yearly flu shot, Pneumococcal series at age 88.   Return if symptoms worsen or fail to improve.   Lanier Clam, MD 06/23/2020   This appointment required 48 minutes of patient care (this includes precharting, chart review, review of results, face-to-face care, etc.).

## 2020-06-23 NOTE — Telephone Encounter (Signed)
Patient was seen in office today and did not need a thoro. Will close encounter.

## 2020-06-23 NOTE — Patient Instructions (Signed)
Nice to meet you  Your chest x-rays as well as the recent CT scan for your coronary arteries show improving pneumonia.  I expect this to continue to improve with time.  Please let me know if I can help in any way.  No need for a follow-up visit to be scheduled.  If symptoms change or worsen please let me know and happy to see you at any time.  Follow-up as needed

## 2020-06-25 ENCOUNTER — Other Ambulatory Visit: Payer: Self-pay | Admitting: Obstetrics and Gynecology

## 2020-06-25 DIAGNOSIS — Z1231 Encounter for screening mammogram for malignant neoplasm of breast: Secondary | ICD-10-CM

## 2020-07-06 ENCOUNTER — Other Ambulatory Visit: Payer: Self-pay

## 2020-07-06 MED ORDER — METOPROLOL TARTRATE 50 MG PO TABS: 75.0000 mg | ORAL_TABLET | Freq: Two times a day (BID) | ORAL | 3 refills | Status: DC

## 2020-08-16 DIAGNOSIS — M25562 Pain in left knee: Secondary | ICD-10-CM | POA: Diagnosis not present

## 2020-08-17 ENCOUNTER — Other Ambulatory Visit: Payer: Self-pay | Admitting: Obstetrics and Gynecology

## 2020-08-17 DIAGNOSIS — Z1231 Encounter for screening mammogram for malignant neoplasm of breast: Secondary | ICD-10-CM

## 2020-08-19 ENCOUNTER — Other Ambulatory Visit: Payer: Self-pay

## 2020-08-19 ENCOUNTER — Ambulatory Visit: Payer: Managed Care, Other (non HMO)

## 2020-08-19 ENCOUNTER — Ambulatory Visit
Admission: RE | Admit: 2020-08-19 | Discharge: 2020-08-19 | Disposition: A | Discharge status: Home / Self Care | Payer: PPO | Source: Ambulatory Visit | Attending: Obstetrics and Gynecology | Admitting: Obstetrics and Gynecology

## 2020-08-19 DIAGNOSIS — Z1231 Encounter for screening mammogram for malignant neoplasm of breast: Secondary | ICD-10-CM

## 2020-09-03 DIAGNOSIS — H5211 Myopia, right eye: Secondary | ICD-10-CM | POA: Diagnosis not present

## 2020-09-03 DIAGNOSIS — H26491 Other secondary cataract, right eye: Secondary | ICD-10-CM | POA: Diagnosis not present

## 2020-09-03 DIAGNOSIS — Z961 Presence of intraocular lens: Secondary | ICD-10-CM | POA: Diagnosis not present

## 2020-10-27 DIAGNOSIS — Z01419 Encounter for gynecological examination (general) (routine) without abnormal findings: Secondary | ICD-10-CM | POA: Diagnosis not present

## 2020-10-27 DIAGNOSIS — Z780 Asymptomatic menopausal state: Secondary | ICD-10-CM | POA: Diagnosis not present

## 2020-11-01 ENCOUNTER — Other Ambulatory Visit: Payer: Self-pay | Admitting: Nurse Practitioner

## 2020-11-01 DIAGNOSIS — Z780 Asymptomatic menopausal state: Secondary | ICD-10-CM

## 2020-11-03 DIAGNOSIS — R3989 Other symptoms and signs involving the genitourinary system: Secondary | ICD-10-CM | POA: Diagnosis not present

## 2020-11-17 DIAGNOSIS — M25562 Pain in left knee: Secondary | ICD-10-CM | POA: Diagnosis not present

## 2020-11-19 ENCOUNTER — Other Ambulatory Visit: Payer: Self-pay

## 2020-11-19 ENCOUNTER — Encounter (HOSPITAL_BASED_OUTPATIENT_CLINIC_OR_DEPARTMENT_OTHER): Payer: Self-pay

## 2020-11-19 ENCOUNTER — Emergency Department (HOSPITAL_BASED_OUTPATIENT_CLINIC_OR_DEPARTMENT_OTHER)
Admission: EM | Admit: 2020-11-19 | Discharge: 2020-11-20 | Disposition: A | Discharge status: Home / Self Care | Payer: PPO | Attending: Emergency Medicine | Admitting: Emergency Medicine

## 2020-11-19 DIAGNOSIS — T783XXA Angioneurotic edema, initial encounter: Secondary | ICD-10-CM

## 2020-11-19 DIAGNOSIS — R609 Edema, unspecified: Secondary | ICD-10-CM | POA: Diagnosis present

## 2020-11-19 DIAGNOSIS — Z79899 Other long term (current) drug therapy: Secondary | ICD-10-CM | POA: Insufficient documentation

## 2020-11-19 DIAGNOSIS — Z8616 Personal history of COVID-19: Secondary | ICD-10-CM | POA: Diagnosis not present

## 2020-11-19 DIAGNOSIS — J45909 Unspecified asthma, uncomplicated: Secondary | ICD-10-CM | POA: Diagnosis not present

## 2020-11-19 DIAGNOSIS — I10 Essential (primary) hypertension: Secondary | ICD-10-CM | POA: Diagnosis not present

## 2020-11-19 DIAGNOSIS — L509 Urticaria, unspecified: Secondary | ICD-10-CM | POA: Diagnosis not present

## 2020-11-19 MED ORDER — DIPHENHYDRAMINE HCL 50 MG/ML IJ SOLN
25.0000 mg | Freq: Once | INTRAMUSCULAR | Status: AC
  Administered 2020-11-19: 25 mg via INTRAVENOUS
  Filled 2020-11-19: qty 1

## 2020-11-19 MED ORDER — FAMOTIDINE IN NACL 20-0.9 MG/50ML-% IV SOLN
20.0000 mg | Freq: Once | INTRAVENOUS | Status: AC
  Administered 2020-11-19: 20 mg via INTRAVENOUS
  Filled 2020-11-19: qty 50

## 2020-11-19 MED ORDER — EPINEPHRINE 0.3 MG/0.3ML IJ SOAJ: 0.3000 mg | INTRAMUSCULAR | 0 refills | Status: DC | PRN

## 2020-11-19 MED ORDER — SODIUM CHLORIDE 0.9 % IV BOLUS
1000.0000 mL | Freq: Once | INTRAVENOUS | Status: AC
  Administered 2020-11-19: 1000 mL via INTRAVENOUS

## 2020-11-19 MED ORDER — METHYLPREDNISOLONE SODIUM SUCC 125 MG IJ SOLR
125.0000 mg | Freq: Once | INTRAMUSCULAR | Status: AC
  Administered 2020-11-19: 125 mg via INTRAVENOUS
  Filled 2020-11-19: qty 2

## 2020-11-19 NOTE — ED Notes (Signed)
Present with swelling to face and lips.  Onset yesterday getting worse today.  Denies SOB or pain.  States is itchy "a little"  Noted redness and swelling to both cheeks and to upper lips.  tongue appears normal.  Non labored breathing.  States has had this happen in past and had allergy workup not finding cause.  States this has not happen in years.

## 2020-11-19 NOTE — ED Triage Notes (Addendum)
Pt is present for lip swelling that started last night and has progressively worsened throughout the day. Pt is not sure what may have caused it. Pt finished Augmentin on Monday for a UTI. Pt is concerned that her lips will continue to swell to her throat.   Pt took 25 mg of Benadryl at 1600 this evening.

## 2020-11-19 NOTE — Discharge Instructions (Addendum)
You have been seen and discharged from the emergency department.  You were diagnosed with angioedema.  You were treated with Benadryl, steroids, Pepcid and fluids.  Fill the prescription of steroids and take that as directed.  Continue to take Benadryl every 6-8 hours for the next 3 days.  Establish care with an allergist and get further testing.  Follow-up with your primary provider for reevaluation and further care. Take home medications as prescribed. If you have any worsening symptoms, worsening facial swelling, rash, difficulty breathing, difficulty swallowing, nausea/vomiting or further concerns for your health please return to an emergency department for further evaluation.  You have also been sent a prescription of an EpiPen.  Fill this and carry with you for the rare chance that you would have a severe reaction that would require its use.

## 2020-11-19 NOTE — ED Provider Notes (Signed)
[facility] EMERGENCY DEPT Provider Note   CSN: [id number] Arrival date & time: 11/19/20  1844     History Chief Complaint  Patient presents with   Oral Swelling    [name] is a 65 y.o. female.  HPI  64 year old female with past medical history of HTN, asthma presents the emergency department with concern for oral swelling.  Patient states last night her upper lip with a small area of swelling.  When she woke up this morning she had swelling of the lower and upper lip and right cheek.  Denies any tongue, intraoral swelling.  No difficulty swallowing, no voice change.  Did not notice any rash.  No shortness of breath, wheezing, nausea/vomiting.  She has had mild allergic reactions before but never this pronounced facial swelling.  She is not on an ACE inhibitor.  No new medications.  Past Medical History:  Diagnosis Date   Asthma    Depression    Erosive esophagitis    Headaches, cluster    Hepatitis C    HTN (hypertension)     Patient Active Problem List   Diagnosis Date Noted   Atypical chest pain 06/08/2020   Cough 06/08/2020   COVID-19 virus detected 06/08/2020   Pleural effusion, left 06/08/2020   ARF (acute renal failure) ([place]) 05/29/2020   Pneumonia due to COVID-19 virus 05/29/2020   Hypokalemia 05/29/2020   Hyponatremia 05/29/2020   Lactic acidosis 05/29/2020   Hypomagnesemia 05/29/2020   Elevated d-dimer 05/29/2020   HTN (hypertension)    Depression    Asthma     Past Surgical History:  Procedure Laterality Date   KNEE SURGERY  04/2020     OB History   No obstetric history on file.     Family History  Problem Relation Age of Onset   Emphysema Father    Coronary artery disease Mother        CABG   Diabetes Mellitus II Mother    Heart disease Mother    Stroke Brother    Heart attack Sister    Crohn's disease Sister    Liver disease Sister    Heart disease Brother    Colitis Brother    Heart disease Brother    Irritable  bowel syndrome Daughter    Bipolar disorder Daughter    Breast cancer Neg Hx     Social History   Tobacco Use   Smoking status: Never   Smokeless tobacco: Never  Substance Use Topics   Alcohol use: Not Currently    Alcohol/week: 0.0 standard drinks    Home Medications Prior to Admission medications   Medication Sig Start Date End Date Taking? Authorizing Provider  baclofen (LIORESAL) 10 MG tablet Take 1 tablet by mouth daily as needed for muscle spasms. 04/29/20   [provider]  benzonatate (TESSALON) 200 MG capsule Take 1 capsule (200 mg total) by mouth 3 (three) times daily as needed for cough. 06/08/20   Lauraine Rinne, NP  Calcium Carb-Cholecalciferol (CALCIUM-VITAMIN D3) 600-400 MG-UNIT TABS Take 1 tablet by mouth daily.    [provider]  chlorthalidone (HYGROTON) 25 MG tablet Take 25 mg by mouth daily. 08/15/19   [provider]  gabapentin (NEURONTIN) 100 MG capsule Take 100 mg by mouth in the morning, at noon, and at bedtime. 05/01/20   [provider]  metoprolol tartrate (LOPRESSOR) 50 MG tablet Take 1.5 tablets (75 mg total) by mouth 2 (two) times daily. 07/06/20   Josue Hector, MD  Multiple Vitamins-Minerals (MULTIVITAMIN PO) Take 1 tablet by mouth daily.    [provider]  naproxen sodium (ALEVE) 220 MG tablet Take 220 mg by mouth 2 (two) times daily as needed (pain).    [provider]  omeprazole (PRILOSEC) 40 MG capsule Take 40 mg by mouth daily. 10/14/15   [provider]  ondansetron (ZOFRAN) 4 MG tablet Take 4 mg by mouth every 8 (eight) hours as needed for nausea. 04/29/20   [provider]  oxyCODONE (OXY IR/ROXICODONE) 5 MG immediate release tablet Take 5 mg by mouth 4 (four) times daily as needed for moderate pain. 05/17/20   [provider]  potassium chloride (KLOR-CON) 10 MEQ tablet Take 1 tablet (10 mEq total) by mouth daily. 06/08/20   Lauraine Rinne, NP  sennosides-docusate sodium  (SENOKOT-S) 8.6-50 MG tablet Take 1 tablet by mouth as needed (when taking pain medication). 04/29/20   [provider]  Venlafaxine HCl 75 MG TB24 Take 37.5 mg by mouth daily.    [provider]    Allergies    Atenolol, Lisinopril, Paxil [paroxetine hcl], Zoloft [sertraline hcl], and Elemental sulfur  Review of Systems   Review of Systems  Constitutional:  Negative for chills and fever.  HENT:  Positive for facial swelling. Negative for congestion, sore throat, trouble swallowing and voice change.   Respiratory:  Negative for shortness of breath.   Cardiovascular:  Negative for chest pain.  Gastrointestinal:  Negative for abdominal pain, diarrhea and vomiting.  Genitourinary:  Negative for dysuria.  Musculoskeletal:  Negative for myalgias.  Skin:  Negative for rash.  Neurological:  Negative for headaches.   Physical Exam Updated Vital Signs BP (!) 154/81 (BP Location: Right Arm)   Pulse 70   Temp 98.1 F (36.7 C)   Resp 16   Ht '5\' 4"'$  (1.626 m)   Wt 102.1 kg   SpO2 100%   BMI 38.62 kg/m   Physical Exam Vitals and nursing note reviewed.  Constitutional:      General: She is not in acute distress.    Appearance: Normal appearance. She is not diaphoretic.  HENT:     Head: Normocephalic.     Mouth/Throat:     Mouth: Mucous membranes are moist.     Comments: Mainly upper right lip swelling, tongue is unremarkable, uvula is midline and nonswollen, swallowing and speaking freely Neck:     Comments: No stridor Cardiovascular:     Rate and Rhythm: Normal rate.  Pulmonary:     Effort: Pulmonary effort is normal. No respiratory distress.  Abdominal:     Palpations: Abdomen is soft.     Tenderness: There is no abdominal tenderness.  Skin:    General: Skin is warm.     Comments: Small area of urticaria on the right neck  Neurological:     Mental Status: She is alert and oriented to person, place, and time. Mental status is at baseline.  Psychiatric:         Mood and Affect: Mood normal.    ED Results / Procedures / Treatments   Labs (all labs ordered are listed, but only abnormal results are displayed) Labs Reviewed - No data to display  EKG None  Radiology No results found.  Procedures Procedures   Medications Ordered in ED Medications  sodium chloride 0.9 % bolus 1,000 mL (1,000 mLs Intravenous New Bag/Given 11/19/20 2028)  famotidine (PEPCID) IVPB 20 mg premix (20 mg Intravenous New Bag/Given 11/19/20 2038)  methylPREDNISolone  sodium succinate (SOLU-MEDROL) 125 mg/2 mL injection 125 mg (125 mg Intravenous Given 11/19/20 2029)  diphenhydrAMINE (BENADRYL) injection 25 mg (25 mg Intravenous Given 11/19/20 2029)    ED Course  I have reviewed the triage vital signs and the nursing notes.  Pertinent labs & imaging results that were available during my care of the patient were reviewed by me and considered in my medical decision making (see chart for details).    MDM Rules/Calculators/A&P                           65 year old female presents emergency department with swelling of her upper lip and right cheek.  Patient states this started last night.  No inciting event that she can think of.  She has never had this before.  She is not on ACE inhibitor's.  No history of severe allergy before.  Vital signs are stable.  She has mild edema to the right upper lip and edema to the right cheek with some overlying redness.  No intraoral swelling, no stridor, no difficulty swallowing, normal voice, no other signs of severe allergic reaction or anaphylaxis.  After IV medication the right cheek swelling is completely resolved, the lip is significantly improved and almost completely resolved.  She again has developed no other signs or symptoms of severe allergic reaction or anaphylaxis.  No intraoral swelling.  She is well-appearing, has been ambulatory.  Cough tolerating p.o.  She has a prescription of steroids at home that she is can start taking  tomorrow.  I advised her to continue Benadryl for the next couple days.  Have also prescribed her an EpiPen for possible severe reaction and referred her to allergy.  Patient at this time appears safe and stable for discharge and will be treated as an outpatient.  Discharge plan and strict return to ED precautions discussed, patient verbalizes understanding and agreement.  Final Clinical Impression(s) / ED Diagnoses Final diagnoses:  None    Rx / DC Orders ED Discharge Orders     None        Lorelle Gibbs, DO 11/19/20 2346

## 2020-12-21 NOTE — Progress Notes (Signed)
NEW PATIENT Date of Service/Encounter:  12/22/20 Referring provider: Lujean Amel, MD Primary care provider: Lujean Amel, MD  Subjective:  [name] is a 65 y.o. female with a PMHx of asthma and HTN presenting today for evaluation of angioedema History obtained from: chart review and patient.  Throughout her life, has had several episodes of lip/facial swelling, but will go years without any more episodes.  Usually will be bottom lip swelling that progresses to her entire face.  Does associate some itching and sometimes associates hives.   She has had hives in the past not in the setting of swelling.   She thinks the most recent swelling episode she did have hives on her neck and face.   Swelling usually go away within 1-3 days.   No obvious triggers.   She did have shrimp before one of the episodes, but has had shrimp since and it didn't cause symptoms. She has had food allergy testing in the past which was negative. She does not avoid any specific food because of this. She carries an Epipen, but has never had to use it. She did start a new collagen supplement.  It had fish in it.  She hasn't had the supplement since, but she has had fish since then. Her reaction was > 6 hours after the collagen supplement making this a very unlikely source of her swelling/hives and would not explain any of her prior episodes.   No family history of angioedema.   Chart Review:  11/19/20-ED visit - seen for upper lip swelling started prior day, hives noted on exam, no other system involvement, no obvious inciting factor - treated with IV methylpred, benadryl, pepecid - noted marked improvement in swelling - referred to allergy  Other allergy screening: Asthma: no Food allergy: no Medication allergy: no Hymenoptera allergy: no Urticaria: yes Eczema:no  Past Medical History: Past Medical History:  Diagnosis Date   Asthma    Depression    Erosive esophagitis    Headaches,  cluster    Hepatitis C    HTN (hypertension)    Medication List:  Current Outpatient Medications  Medication Sig Dispense Refill   Calcium Carb-Cholecalciferol (CALCIUM-VITAMIN D3) 600-400 MG-UNIT TABS Take 1 tablet by mouth daily.     chlorthalidone (HYGROTON) 25 MG tablet Take 25 mg by mouth daily.     EPINEPHrine 0.3 mg/0.3 mL IJ SOAJ injection Inject 0.3 mg into the muscle as needed for anaphylaxis. 1 each 0   metoprolol tartrate (LOPRESSOR) 50 MG tablet Take 1.5 tablets (75 mg total) by mouth 2 (two) times daily. 270 tablet 3   Multiple Vitamins-Minerals (MULTIVITAMIN PO) Take 1 tablet by mouth daily.     naproxen sodium (ALEVE) 220 MG tablet Take 220 mg by mouth 2 (two) times daily as needed (pain).     omeprazole (PRILOSEC) 40 MG capsule Take 40 mg by mouth daily.  10   Venlafaxine HCl 75 MG TB24 Take 37.5 mg by mouth daily.     baclofen (LIORESAL) 10 MG tablet Take 1 tablet by mouth daily as needed for muscle spasms. (Patient not taking: Reported on 12/22/2020)     No current facility-administered medications for this visit.   Known Allergies:  Allergies  Allergen Reactions   Atenolol     FATIGUE/ HYPOTENSION   Lisinopril     COUGH/ WHEEZING   Paxil [Paroxetine Hcl]     INCREASED APPETITE FOR SWEETS   Zoloft [Sertraline Hcl]     TINNITUS   Elemental Sulfur  Rash    FEVER   Past Surgical History: Past Surgical History:  Procedure Laterality Date   KNEE SURGERY  04/2020   Family History: Family History  Problem Relation Age of Onset   Emphysema Father    Coronary artery disease Mother        CABG   Diabetes Mellitus II Mother    Heart disease Mother    Stroke Brother    Heart attack Sister    Crohn's disease Sister    Liver disease Sister    Heart disease Brother    Colitis Brother    Heart disease Brother    Irritable bowel syndrome Daughter    Bipolar disorder Daughter    Breast cancer Neg Hx    Social History: [name] lives in a house without water  damage, carpet in bedroom, no pets, no smoke exposure, she is retired.   ROS:  All other systems negative except as noted per HPI.  Objective:  Blood pressure 120/70, pulse 86, temperature (!) 97.4 F (36.3 C), temperature source Temporal, resp. rate 16, height '5\' 4"'$  (1.626 m), weight 228 lb 12.8 oz (103.8 kg), SpO2 97 %. Body mass index is 39.27 kg/m. Physical Exam:  General Appearance:  Alert, cooperative, extremely pleasant, no distress, appears stated age  Head:  Normocephalic, without obvious abnormality, atraumatic  Eyes:  Conjunctiva clear, EOM's intact  Nose: Nares normal, normal mucosa  Throat: Lips, tongue normal; teeth and gums normal, normal posterior oropharynx  Neck: Supple, symmetrical  Lungs:   Respirations unlabored, no coughing  Heart:  Appears well perfused  Extremities: No edema  Skin: Skin color, texture, turgor normal, no rashes or lesions on visualized portions of skin  Neurologic: No gross deficits   Diagnostics: Labs as below   Assessment:  Angioedema, initial encounter - Given duration and associated hives and itching without a consistent trigger, suspect idiopathic angioedema ( a subset of chronic idiopathic urticaria).  Will do screening labs for bradykinin mediated forms of swelling but doubt this based on history.  Will screen for underlying mast cell disorders as well as alpha-gal which is a delayed food allergy and trigger often not identified.  Discussed symptomatic treatment and can consider prophylactic treatment should these episodes become more frequent.  Plan/Recommendations:   Patient Instructions  Angioedema (tissue swelling):  - today we discussed both of the common types of swelling  - bradykinin mediated (usually no itching or hives, lasts ~2-3 days, doesn't respond to antihistamines or epinephrine) Vs - histamine mediated (usually with hives or itching, lasts 1-2 days, responds to antihistamines or epinephrine) - you overlap with both  categories, so we will do testing to rule out some common causes of both - obtain today labs for histamine causes: tryptase, alpha gal panel (red meat allergy) - obtain today labs for bradykinin-associated causes: C4  For Now:  - if happens again, can take up to 4 zyrtec (2 tablets in the morning and 2 tablets in the evening) - if happening more frequently, can start a daily zyrtec 1 tablet (10 mg) and can increase up to 4 per day to help prevent symptoms - Epipen to be injected in your outer thigh if concern for airway involvement (throat closing, difficulty breathing, etc) - if you feel airway involvement, please call 911 and seek emergency medical care - we will contact you with results  Follow-up in 6 months, sooner if needed.  This note in its entirety was forwarded to the Provider who requested this consultation.  Thank you for your kind referral. I appreciate the opportunity to take part in Callahan's care. Please do not hesitate to contact me with questions.  Sincerely,  Sigurd Sos, MD [facility] [place]

## 2020-12-22 ENCOUNTER — Encounter: Payer: Self-pay | Admitting: Internal Medicine

## 2020-12-22 ENCOUNTER — Other Ambulatory Visit: Payer: Self-pay

## 2020-12-22 ENCOUNTER — Ambulatory Visit: Payer: PPO | Admitting: Internal Medicine

## 2020-12-22 VITALS — BP 120/70 | HR 86 | Temp 97.4°F | Resp 16 | Ht 64.0 in | Wt 228.8 lb

## 2020-12-22 DIAGNOSIS — T783XXA Angioneurotic edema, initial encounter: Secondary | ICD-10-CM | POA: Diagnosis not present

## 2020-12-22 DIAGNOSIS — T781XXA Other adverse food reactions, not elsewhere classified, initial encounter: Secondary | ICD-10-CM

## 2020-12-22 NOTE — Patient Instructions (Addendum)
Angioedema (tissue swelling):  - today we discussed both of the common types of swelling  - bradykinin mediated (usually no itching or hives, lasts ~2-3 days, doesn't respond to antihistamines or epinephrine) Vs - histamine mediated (usually with hives or itching, lasts 1-2 days, responds to antihistamines or epinephrine) - you overlap with both categories, so we will do testing to rule out some common causes of both - obtain today labs for histamine causes: tryptase, alpha gal panel (red meat allergy) - obtain today labs for bradykinin-associated causes: C4  For Now:  - if happens again, can take up to 4 zyrtec (2 tablets in the morning and 2 tablets in the evening) - if happening more frequently, can start a daily zyrtec 1 tablet (10 mg) and can increase up to 4 per day to help prevent symptoms - Epipen to be injected in your outer thigh if concern for airway involvement (throat closing, difficulty breathing, etc) - if you feel airway involvement, please call 911 and seek emergency medical care - we will contact you with results  Follow-up in 6 months, sooner if needed.

## 2020-12-27 LAB — C4 COMPLEMENT: Complement C4, Serum: 46 mg/dL — ABNORMAL HIGH (ref 12–38)

## 2020-12-27 LAB — ALPHA-GAL PANEL
Allergen Lamb IgE: <0.1 kU/L
Beef IgE: <0.1 kU/L
IgE (Immunoglobulin E), Serum: 39 IU/mL (ref 6–495)
O215-IgE Alpha-Gal: <0.1 kU/L
Pork IgE: <0.1 kU/L

## 2020-12-27 LAB — TRYPTASE: Tryptase: 2.7 ug/L (ref 2.2–13.2)

## 2020-12-29 NOTE — Progress Notes (Signed)
Please let [name] know that her testing was reassuring. She does not have the red meat/mammalian meat allergy. Her other labs were reassuring. She should continue the plan as discussed in clinic. Please arrange for 6 month follow-up. Thanks.

## 2021-01-17 DIAGNOSIS — L718 Other rosacea: Secondary | ICD-10-CM | POA: Diagnosis not present

## 2021-01-17 DIAGNOSIS — L918 Other hypertrophic disorders of the skin: Secondary | ICD-10-CM | POA: Diagnosis not present

## 2021-01-17 DIAGNOSIS — L570 Actinic keratosis: Secondary | ICD-10-CM | POA: Diagnosis not present

## 2021-01-17 DIAGNOSIS — L821 Other seborrheic keratosis: Secondary | ICD-10-CM | POA: Diagnosis not present

## 2021-01-17 DIAGNOSIS — D225 Melanocytic nevi of trunk: Secondary | ICD-10-CM | POA: Diagnosis not present

## 2021-01-19 NOTE — Progress Notes (Signed)
CARDIOLOGY CONSULT NOTE       Patient ID: [name] MRN: [id number] DOB/AGE: 10/28/1955 65 y.o.  Admit date: (Not on file) Referring Physician: [name]  Primary Physician: [name] Amel, MD Primary Cardiologist: [name]  Reason for Consultation: SVT  Active Problems:   * No active hospital problems. *   HPI:  65 y.o. referred by [name] and [name] on 06/21/20  for SVT. Seen in ER 06/09/20 for tachycardia Hospitalization for COVID/bacterial pneumonia. She has sinus tachycardia during hospitalization and was d/c on metoprolol. HR initially 190 bpm give cardizem on route to ED HR 841'L Concerted with adenosine. D/c with lopressor 75 bid for ST and PaCls  She was at PT and they noted elevated HR. She had no chest pain dyspnea palpitations or syncope Rx with antibiotics and amoxicillin for pneumococcal bacteremia. CT negative for PE She had left pleural effusion that could not be tapped TTE done 06/01/20 with EF 60-65% no significant valve disease F/U CXR 06/09/20 with persistent left basilar consolidation and effusion unchanged   ECG 06/09/20 showed narrow complex tachycardia rate 150 bpm post adenosine clear p waves ST rate 102   History of HTN, hepatitis C asthma depression non smoker Tested COVID positive 1/30 She is unvaccinated She had partial knee replacement 04/29/20 DOAC stopped with negative LE venous duplex and CTA for PE   She is widowed Husband committed suicide from carbon monoxide in 2016 Calcium score was 0 in 2017  Doing intermittent fasting for weight  Concerned about scarring in left lung from COVID  ROS All other systems reviewed and negative except as noted above  Past Medical History:  Diagnosis Date   Asthma    Depression    Erosive esophagitis    Headaches, cluster    Hepatitis C    HTN (hypertension)     Family History  Problem Relation Age of Onset   Emphysema Father    Coronary artery disease Mother        CABG    Diabetes Mellitus II Mother    Heart disease Mother    Stroke Brother    Heart attack Sister    Crohn's disease Sister    Liver disease Sister    Heart disease Brother    Colitis Brother    Heart disease Brother    Irritable bowel syndrome Daughter    Bipolar disorder Daughter    Breast cancer Neg Hx     Social History   Socioeconomic History   Marital status: Widowed    Spouse name: Not on file   Number of children: Not on file   Years of education: Not on file   Highest education level: Not on file  Occupational History   Not on file  Tobacco Use   Smoking status: Never   Smokeless tobacco: Never  Substance and Sexual Activity   Alcohol use: Not Currently    Alcohol/week: 0.0 standard drinks   Drug use: Not on file   Sexual activity: Not on file  Other Topics Concern   Not on file  Social History Narrative   Not on file   Social Determinants of Health   Financial Resource Strain: Not on file  Food Insecurity: Not on file  Transportation Needs: Not on file  Physical Activity: Not on file  Stress: Not on file  Social Connections: Not on file  Intimate Partner Violence: Not on file    Past Surgical History:  Procedure Laterality Date  KNEE SURGERY  04/2020      Current Outpatient Medications:    Calcium Carb-Cholecalciferol (CALCIUM-VITAMIN D3) 600-400 MG-UNIT TABS, Take 1 tablet by mouth daily., Disp: , Rfl:    chlorthalidone (HYGROTON) 25 MG tablet, Take 25 mg by mouth daily., Disp: , Rfl:    EPINEPHrine 0.3 mg/0.3 mL IJ SOAJ injection, Inject 0.3 mg into the muscle as needed for anaphylaxis., Disp: 1 each, Rfl: 0   metoprolol tartrate (LOPRESSOR) 50 MG tablet, Take 1.5 tablets (75 mg total) by mouth 2 (two) times daily., Disp: 270 tablet, Rfl: 3   Multiple Vitamins-Minerals (MULTIVITAMIN PO), Take 1 tablet by mouth daily., Disp: , Rfl:    naproxen sodium (ALEVE) 220 MG tablet, Take 220 mg by mouth 2 (two) times daily as needed (pain)., Disp: , Rfl:     omeprazole (PRILOSEC) 40 MG capsule, Take 40 mg by mouth daily., Disp: , Rfl: 10   Venlafaxine HCl 75 MG TB24, Take 37.5 mg by mouth daily., Disp: , Rfl:     Physical Exam: Blood pressure 140/90, pulse 86, height 5\' 4"  (1.626 m), weight 99.7 kg, SpO2 95 %.   Affect appropriate Healthy:  appears stated age 19: normal Neck supple with no adenopathy JVP normal no bruits no thyromegaly Lungs decreased BS left base  Heart:  S1/S2 no murmur, no rub, gallop or click PMI normal Abdomen: benighn, BS positve, no tenderness, no AAA no bruit.  No HSM or HJR Distal pulses intact with no bruits No edema Neuro non-focal Skin warm and dry No muscular weakness Post right TKR   Labs:   Lab Results  Component Value Date   WBC 12.1 (H) 06/09/2020   HGB 11.4 (L) 06/09/2020   HCT 35.9 (L) 06/09/2020   MCV 87.8 06/09/2020   PLT 430 (H) 06/09/2020    No results for input(s): NA, K, CL, CO2, BUN, CREATININE, CALCIUM, PROT, BILITOT, ALKPHOS, ALT, AST, GLUCOSE in the last 168 hours.  Invalid input(s): LABALBU No results found for: CKTOTAL, CKMB, CKMBINDEX, TROPONINI No results found for: CHOL No results found for: HDL No results found for: [facility] Lab Results  Component Value Date   TRIG 114 05/29/2020   No results found for: CHOLHDL No results found for: LDLDIRECT    Radiology: No results found.   EKG: ST rate 102 normal no pre excitation 06/10/20    ASSESSMENT AND PLAN:   1. SVT: in setting of pneumococcal pneumonia and COVID continue beta blocker TTE no structural heart disease. Can consider EP referral for recurrence  2. Pulmonary:  F/u [name] CXR 06/09/20 persistent infiltrate post COVID/Pneumococcal sepsis and history of asthma Normal exam consider Using IS at home still   3. Anticoagulation: post right knee surgery stopped with negative LE venous duplex and CTA for PE  4. HTN:  Well controlled.  Continue current medications and low sodium Dash type diet.  Repeat in  office 140/80 mmHg working on diet and weight loss with fasting    5. CAD:  Risk stratification  Calcium score 0 06/21/20   6. Angioedema:  idiopathic f/u allergy Rx with zyrtec and has Epipen F/U Dr Simona Huh   F/U in a year  Refer EP any recurrence SVT   Signed: Jenkins Rouge 01/24/2021, 9:22 AM

## 2021-01-24 ENCOUNTER — Other Ambulatory Visit: Payer: Self-pay

## 2021-01-24 ENCOUNTER — Encounter: Payer: Self-pay | Admitting: Cardiovascular Disease

## 2021-01-24 ENCOUNTER — Ambulatory Visit: Payer: PPO | Admitting: Cardiovascular Disease

## 2021-01-24 VITALS — BP 140/90 | HR 86 | Ht 64.0 in | Wt 219.8 lb

## 2021-01-24 DIAGNOSIS — I10 Essential (primary) hypertension: Secondary | ICD-10-CM

## 2021-01-24 DIAGNOSIS — I471 Supraventricular tachycardia: Secondary | ICD-10-CM | POA: Diagnosis not present

## 2021-01-24 NOTE — Patient Instructions (Signed)
Medication Instructions:  *If you need a refill on your cardiac medications before your next appointment, please call your pharmacy*  Lab Work: If you have labs (blood work) drawn today and your tests are completely normal, you will receive your results only by: [facility] (if you have MyChart) OR A paper copy in the mail If you have any lab test that is abnormal or we need to change your treatment, we will call you to review the results.  Testing/Procedures: None ordered today.  Follow-Up: At [facility], you and your health needs are our priority.  As part of our continuing mission to provide you with exceptional heart care, we have created designated Provider Care Teams.  These Care Teams include your primary Cardiologist (physician) and Advanced Practice Providers (APPs -  Physician Assistants and Nurse Practitioners) who all work together to provide you with the care you need, when you need it.  We recommend signing up for the patient portal called "MyChart".  Sign up information is provided on this After Visit Summary.  MyChart is used to connect with patients for Virtual Visits (Telemedicine).  Patients are able to view lab/test results, encounter notes, upcoming appointments, etc.  Non-urgent messages can be sent to your provider as well.   To learn more about what you can do with MyChart, go to [email or web address].    Your next appointment:   1 year(s)  The format for your next appointment:   In Person  Provider:   You may see Dr. Johnsie Cancel or one of the following Advanced Practice Providers on your designated Care Team:   Cecilie Kicks, NP

## 2021-01-27 ENCOUNTER — Ambulatory Visit: Payer: Managed Care, Other (non HMO) | Admitting: Allergy

## 2021-01-27 DIAGNOSIS — Z79899 Other long term (current) drug therapy: Secondary | ICD-10-CM | POA: Diagnosis not present

## 2021-01-27 DIAGNOSIS — M25511 Pain in right shoulder: Secondary | ICD-10-CM | POA: Diagnosis not present

## 2021-01-27 DIAGNOSIS — R7309 Other abnormal glucose: Secondary | ICD-10-CM | POA: Diagnosis not present

## 2021-01-27 DIAGNOSIS — I10 Essential (primary) hypertension: Secondary | ICD-10-CM | POA: Diagnosis not present

## 2021-01-27 DIAGNOSIS — Z0000 Encounter for general adult medical examination without abnormal findings: Secondary | ICD-10-CM | POA: Diagnosis not present

## 2021-01-27 DIAGNOSIS — I471 Supraventricular tachycardia: Secondary | ICD-10-CM | POA: Diagnosis not present

## 2021-01-27 DIAGNOSIS — F321 Major depressive disorder, single episode, moderate: Secondary | ICD-10-CM | POA: Diagnosis not present

## 2021-03-07 DIAGNOSIS — J069 Acute upper respiratory infection, unspecified: Secondary | ICD-10-CM | POA: Diagnosis not present

## 2021-03-31 ENCOUNTER — Ambulatory Visit
Admission: RE | Admit: 2021-03-31 | Discharge: 2021-03-31 | Disposition: A | Discharge status: Home / Self Care | Payer: PPO | Source: Ambulatory Visit | Attending: Emergency Medicine | Admitting: Emergency Medicine

## 2021-03-31 ENCOUNTER — Other Ambulatory Visit: Payer: Self-pay

## 2021-03-31 VITALS — BP 133/85 | HR 87 | Temp 97.8°F | Resp 18

## 2021-03-31 DIAGNOSIS — H6121 Impacted cerumen, right ear: Secondary | ICD-10-CM | POA: Diagnosis not present

## 2021-03-31 DIAGNOSIS — R051 Acute cough: Secondary | ICD-10-CM

## 2021-03-31 DIAGNOSIS — H6120 Impacted cerumen, unspecified ear: Secondary | ICD-10-CM

## 2021-03-31 MED ORDER — PROMETHAZINE-DM 6.25-15 MG/5ML PO SYRP: 5.0000 mL | ORAL_SOLUTION | Freq: Four times a day (QID) | ORAL | 0 refills | Status: DC | PRN

## 2021-03-31 NOTE — Discharge Instructions (Signed)
For nighttime cough, please try Promethazine DM 5 mL at bedtime.  You can continue Mucinex in the daytime for chest congestion.  At this time I have no concern for ear infection in either ear.  Thank you for visiting urgent care today, I hope you feel better soon.

## 2021-03-31 NOTE — ED Triage Notes (Signed)
Pt states her grandson was sick last week now she is having clogging to her right ear with pressure. She c/o congestion  and cough as well.

## 2021-03-31 NOTE — ED Provider Notes (Signed)
[facility]    CSN: [id number] Arrival date & time: 03/31/21  1323    HISTORY  No chief complaint on file.  HPI [name] is a 65 y.o. female. Patient complains of congestion and cough as well as feeling like her right ear is clogged.  Patient states she lives with her daughter and son-in-law and their children, states her grandson was sick last week, patient states she began to feel unwell about 5 days ago.  Patient reports a history of tinnitus, states she thinks its been worse recently, states her daughter has told her that she is speaking louder than normal, patient states she has an appointment next month with her ENT provider.  Patient states she is been coughing up yellowish to gray mucus only in the morning, states she has been taking Mucinex for this with no meaningful relief.  The history is provided by the patient.  Past Medical History:  Diagnosis Date   Asthma    Depression    Erosive esophagitis    Headaches, cluster    Hepatitis C    HTN (hypertension)    Patient Active Problem List   Diagnosis Date Noted   Angio-edema 12/22/2020   Atypical chest pain 06/08/2020   Cough 06/08/2020   COVID-19 virus detected 06/08/2020   Pleural effusion, left 06/08/2020   ARF (acute renal failure) ([place]) 05/29/2020   Pneumonia due to COVID-19 virus 05/29/2020   Hypokalemia 05/29/2020   Hyponatremia 05/29/2020   Lactic acidosis 05/29/2020   Hypomagnesemia 05/29/2020   Elevated d-dimer 05/29/2020   HTN (hypertension)    Depression    Asthma    Past Surgical History:  Procedure Laterality Date   KNEE SURGERY  04/2020   OB History   No obstetric history on file.    Home Medications    Prior to Admission medications   Medication Sig Start Date End Date Taking? Authorizing Provider  Calcium Carb-Cholecalciferol (CALCIUM-VITAMIN D3) 600-400 MG-UNIT TABS Take 1 tablet by mouth daily.    [provider]  chlorthalidone (HYGROTON) 25 MG tablet Take  25 mg by mouth daily. 08/15/19   [provider]  EPINEPHrine 0.3 mg/0.3 mL IJ SOAJ injection Inject 0.3 mg into the muscle as needed for anaphylaxis. 11/19/20   Horton, Alvin Critchley, DO  metoprolol tartrate (LOPRESSOR) 50 MG tablet Take 1.5 tablets (75 mg total) by mouth 2 (two) times daily. 07/06/20   Josue Hector, MD  Multiple Vitamins-Minerals (MULTIVITAMIN PO) Take 1 tablet by mouth daily.    [provider]  naproxen sodium (ALEVE) 220 MG tablet Take 220 mg by mouth 2 (two) times daily as needed (pain).    [provider]  omeprazole (PRILOSEC) 40 MG capsule Take 40 mg by mouth daily. 10/14/15   [provider]  Venlafaxine HCl 75 MG TB24 Take 37.5 mg by mouth daily.    [provider]   Family History Family History  Problem Relation Age of Onset   Emphysema Father    Coronary artery disease Mother        CABG   Diabetes Mellitus II Mother    Heart disease Mother    Stroke Brother    Heart attack Sister    Crohn's disease Sister    Liver disease Sister    Heart disease Brother    Colitis Brother    Heart disease Brother    Irritable bowel syndrome Daughter    Bipolar disorder Daughter    Breast cancer Neg Hx  Social History Social History   Tobacco Use   Smoking status: Never   Smokeless tobacco: Never  Substance Use Topics   Alcohol use: Not Currently    Alcohol/week: 0.0 standard drinks   Allergies   Atenolol, Lisinopril, Paxil [paroxetine hcl], Zoloft [sertraline hcl], and Elemental sulfur  Review of Systems Review of Systems Pertinent findings noted in history of present illness.   Physical Exam Triage Vital Signs ED Triage Vitals  Enc Vitals Group     BP 02/18/21 0827 (!) 147/82     Pulse Rate 02/18/21 0827 72     Resp 02/18/21 0827 18     Temp 02/18/21 0827 98.3 F (36.8 C)     Temp Source 02/18/21 0827 Oral     SpO2 02/18/21 0827 98 %     Weight --      Height --      Head Circumference --      Peak  Flow --      Pain Score 02/18/21 0826 5     Pain Loc --      Pain Edu? --      Excl. in [place]? --   No data found.  Updated Vital Signs BP 133/85 (BP Location: Right Arm)   Pulse 87   Temp 97.8 F (36.6 C) (Oral)   Resp 18   SpO2 97%   Physical Exam Vitals and nursing note reviewed.  Constitutional:      General: She is not in acute distress.    Appearance: Normal appearance. She is not ill-appearing.  HENT:     Head: Normocephalic and atraumatic.     Salivary Glands: Right salivary gland is not diffusely enlarged or tender. Left salivary gland is not diffusely enlarged or tender.     Right Ear: External ear normal. There is impacted cerumen.     Left Ear: Tympanic membrane, ear canal and external ear normal. No drainage.  No middle ear effusion. There is no impacted cerumen. Tympanic membrane is not erythematous or bulging.     Nose: Nose normal. No nasal deformity, septal deviation, mucosal edema, congestion or rhinorrhea.     Right Turbinates: Not enlarged, swollen or pale.     Left Turbinates: Not enlarged, swollen or pale.     Right Sinus: No maxillary sinus tenderness or frontal sinus tenderness.     Left Sinus: No maxillary sinus tenderness or frontal sinus tenderness.     Mouth/Throat:     Lips: Pink. No lesions.     Mouth: Mucous membranes are moist. No oral lesions.     Pharynx: Oropharynx is clear. Uvula midline. No posterior oropharyngeal erythema or uvula swelling.     Tonsils: No tonsillar exudate. 0 on the right. 0 on the left.  Eyes:     General: Lids are normal.        Right eye: No discharge.        Left eye: No discharge.     Extraocular Movements: Extraocular movements intact.     Conjunctiva/sclera: Conjunctivae normal.     Right eye: Right conjunctiva is not injected.     Left eye: Left conjunctiva is not injected.  Neck:     Trachea: Trachea and phonation normal.  Cardiovascular:     Rate and Rhythm: Normal rate and regular rhythm.     Pulses: Normal  pulses.     Heart sounds: Normal heart sounds. No murmur heard.   No friction rub. No gallop.  Pulmonary:  Effort: Pulmonary effort is normal. No accessory muscle usage, prolonged expiration or respiratory distress.     Breath sounds: Normal breath sounds. No stridor, decreased air movement or transmitted upper airway sounds. No decreased breath sounds, wheezing, rhonchi or rales.  Chest:     Chest wall: No tenderness.  Musculoskeletal:        General: Normal range of motion.     Cervical back: Normal range of motion and neck supple. Normal range of motion.  Lymphadenopathy:     Cervical: No cervical adenopathy.  Skin:    General: Skin is warm and dry.     Findings: No erythema or rash.  Neurological:     General: No focal deficit present.     Mental Status: She is alert and oriented to person, place, and time.  Psychiatric:        Mood and Affect: Mood normal.        Behavior: Behavior normal.    Visual Acuity Right Eye Distance:   Left Eye Distance:   Bilateral Distance:    Right Eye Near:   Left Eye Near:    Bilateral Near:     UC Couse / Diagnostics / Procedures:    EKG  Radiology No results found.  Procedures Ear Cerumen Removal  Date/Time: 03/31/2021 2:53 PM Performed by: Johna Sheriff, RN Authorized by: Lynden Oxford Scales, PA-C   Consent:    Consent obtained:  Verbal   Risks, benefits, and alternatives were discussed: yes     Risks discussed:  Bleeding, TM perforation, pain, infection, incomplete removal and dizziness   Alternatives discussed:  No treatment, delayed treatment, alternative treatment, observation and referral Universal protocol:    Procedure explained and questions answered to patient or proxy's satisfaction: yes     Patient identity confirmed:  Verbally with patient Procedure details:    Location:  R ear   Procedure type: irrigation     Procedure outcomes: cerumen removed   Post-procedure details:    Inspection:  Bleeding,  ear canal clear, TM intact and no bleeding   Hearing quality:  Normal   Procedure completion:  Tolerated (including critical care time)  UC Diagnoses / Final Clinical Impressions(s)   I have reviewed the triage vital signs and the nursing notes.  Pertinent labs & imaging results that were available during my care of the patient were reviewed by me and considered in my medical decision making (see chart for details).   Final diagnoses:  Cerumen in auditory canal on examination  Acute cough   Earwax removed, patient requesting cough medication, Promethazine DM prescribed, patient advised she can continue Mucinex as needed.  ED Prescriptions     Medication Sig Dispense Auth. Provider   promethazine-dextromethorphan (PROMETHAZINE-DM) 6.25-15 MG/5ML syrup Take 5 mLs by mouth 4 (four) times daily as needed for cough. 180 mL Lynden Oxford Scales, PA-C      PDMP not reviewed this encounter.  Pending results:  Labs Reviewed - No data to display  Medications Ordered in UC: Medications - No data to display  Disposition Upon Discharge:  Condition: stable for discharge home Home: take medications as prescribed; routine discharge instructions as discussed; follow up as advised.  Patient presented with an acute illness with associated systemic symptoms and significant discomfort requiring urgent management. In my opinion, this is a condition that a prudent lay person (someone who possesses an average knowledge of health and medicine) may potentially expect to result in complications if not addressed urgently such as respiratory  distress, impairment of bodily function or dysfunction of bodily organs.   Routine symptom specific, illness specific and/or disease specific instructions were discussed with the patient and/or caregiver at length.   As such, the patient has been evaluated and assessed, work-up was performed and treatment was provided in alignment with urgent care protocols and  evidence based medicine.  Patient/parent/caregiver has been advised that the patient may require follow up for further testing and treatment if the symptoms continue in spite of treatment, as clinically indicated and appropriate.  The patient was tested for COVID-19, Influenza and/or RSV, then the patient/parent/guardian was advised to isolate at home pending the results of his/her diagnostic coronavirus test and potentially longer if they're positive. I have also advised pt that if his/her COVID-19 test returns positive, it's recommended to self-isolate for at least 10 days after symptoms first appeared AND until fever-free for 24 hours without fever reducer AND other symptoms have improved or resolved. Discussed self-isolation recommendations as well as instructions for household member/close contacts as per the [facility] and [place] DHHS, and also gave patient the [place] packet with this information.  Patient/parent/caregiver has been advised to return to the [facility] or PCP in 3-5 days if no better; to PCP or the Emergency Department if new signs and symptoms develop, or if the current signs or symptoms continue to change or worsen for further workup, evaluation and treatment as clinically indicated and appropriate  The patient will follow up with their current PCP if and as advised. If the patient does not currently have a PCP we will assist them in obtaining one.   The patient may need specialty follow up if the symptoms continue, in spite of conservative treatment and management, for further workup, evaluation, consultation and treatment as clinically indicated and appropriate.  Patient/parent/caregiver verbalized understanding and agreement of plan as discussed.  All questions were addressed during visit.  Please see discharge instructions below for further details of plan.  Discharge Instructions:   Discharge Instructions      For nighttime cough, please try Promethazine DM 5 mL at bedtime.  You can  continue Mucinex in the daytime for chest congestion.  At this time I have no concern for ear infection in either ear.  Thank you for visiting urgent care today, I hope you feel better soon.        Lynden Oxford Scales, PA-C 03/31/21 1515

## 2021-04-01 ENCOUNTER — Ambulatory Visit
Admission: RE | Admit: 2021-04-01 | Discharge: 2021-04-01 | Disposition: A | Discharge status: Home / Self Care | Payer: PPO | Source: Ambulatory Visit | Attending: Nurse Practitioner | Admitting: Nurse Practitioner

## 2021-04-01 DIAGNOSIS — M8589 Other specified disorders of bone density and structure, multiple sites: Secondary | ICD-10-CM | POA: Diagnosis not present

## 2021-04-01 DIAGNOSIS — Z780 Asymptomatic menopausal state: Secondary | ICD-10-CM | POA: Diagnosis not present

## 2021-05-10 ENCOUNTER — Other Ambulatory Visit: Payer: PPO

## 2021-05-10 DIAGNOSIS — H93A2 Pulsatile tinnitus, left ear: Secondary | ICD-10-CM | POA: Diagnosis not present

## 2021-05-10 DIAGNOSIS — H903 Sensorineural hearing loss, bilateral: Secondary | ICD-10-CM | POA: Diagnosis not present

## 2021-05-10 DIAGNOSIS — H9313 Tinnitus, bilateral: Secondary | ICD-10-CM | POA: Diagnosis not present

## 2021-05-10 DIAGNOSIS — H9113 Presbycusis, bilateral: Secondary | ICD-10-CM | POA: Diagnosis not present

## 2021-05-19 ENCOUNTER — Other Ambulatory Visit: Payer: Self-pay

## 2021-05-19 ENCOUNTER — Ambulatory Visit: Payer: PPO | Admitting: Podiatry

## 2021-05-19 ENCOUNTER — Ambulatory Visit (INDEPENDENT_AMBULATORY_CARE_PROVIDER_SITE_OTHER): Payer: PPO

## 2021-05-19 DIAGNOSIS — M7662 Achilles tendinitis, left leg: Secondary | ICD-10-CM

## 2021-05-19 DIAGNOSIS — M79672 Pain in left foot: Secondary | ICD-10-CM

## 2021-05-20 ENCOUNTER — Other Ambulatory Visit: Payer: Self-pay | Admitting: Podiatry

## 2021-05-20 DIAGNOSIS — M7662 Achilles tendinitis, left leg: Secondary | ICD-10-CM

## 2021-05-23 DIAGNOSIS — L570 Actinic keratosis: Secondary | ICD-10-CM | POA: Diagnosis not present

## 2021-05-23 DIAGNOSIS — L718 Other rosacea: Secondary | ICD-10-CM | POA: Diagnosis not present

## 2021-05-24 ENCOUNTER — Encounter: Payer: Self-pay | Admitting: Podiatry

## 2021-05-24 NOTE — Progress Notes (Signed)
Subjective:  Patient ID: [name], female    DOB: 1955/10/31,  MRN: [id number]  No chief complaint on file.   66 y.o. female presents with the above complaint.  Patient presents with complaint left posterior heel pain that has been gone for quite some time has progressed gotten worse.  Patient states is painful to touch painful to walk on.  She states that when she is on her foot it hurts.  She is normally known to Dr. Paulla Dolly.  She wanted to get it evaluated pain scale is 5 out of 10 hurts with ambulation.  She denies any other acute issues.   Review of Systems: Negative except as noted in the HPI. Denies N/V/F/Ch.  Past Medical History:  Diagnosis Date   Asthma    Depression    Erosive esophagitis    Headaches, cluster    Hepatitis C    HTN (hypertension)     Current Outpatient Medications:    Calcium Carb-Cholecalciferol (CALCIUM-VITAMIN D3) 600-400 MG-UNIT TABS, Take 1 tablet by mouth daily., Disp: , Rfl:    chlorthalidone (HYGROTON) 25 MG tablet, Take 25 mg by mouth daily., Disp: , Rfl:    EPINEPHrine 0.3 mg/0.3 mL IJ SOAJ injection, Inject 0.3 mg into the muscle as needed for anaphylaxis., Disp: 1 each, Rfl: 0   metoprolol tartrate (LOPRESSOR) 50 MG tablet, Take 1.5 tablets (75 mg total) by mouth 2 (two) times daily., Disp: 270 tablet, Rfl: 3   Multiple Vitamins-Minerals (MULTIVITAMIN PO), Take 1 tablet by mouth daily., Disp: , Rfl:    naproxen sodium (ALEVE) 220 MG tablet, Take 220 mg by mouth 2 (two) times daily as needed (pain)., Disp: , Rfl:    omeprazole (PRILOSEC) 40 MG capsule, Take 40 mg by mouth daily., Disp: , Rfl: 10   promethazine-dextromethorphan (PROMETHAZINE-DM) 6.25-15 MG/5ML syrup, Take 5 mLs by mouth 4 (four) times daily as needed for cough., Disp: 180 mL, Rfl: 0   Venlafaxine HCl 75 MG TB24, Take 37.5 mg by mouth daily., Disp: , Rfl:   Social History   Tobacco Use  Smoking Status Never  Smokeless Tobacco Never    Allergies  Allergen Reactions    Atenolol     FATIGUE/ HYPOTENSION   Lisinopril     COUGH/ WHEEZING   Paxil [Paroxetine Hcl]     INCREASED APPETITE FOR SWEETS   Zoloft [Sertraline Hcl]     TINNITUS   Elemental Sulfur Rash    FEVER   Objective:  There were no vitals filed for this visit. There is no height or weight on file to calculate BMI. Constitutional Well developed. Well nourished.  Vascular Dorsalis pedis pulses palpable bilaterally. Posterior tibial pulses palpable bilaterally. Capillary refill normal to all digits.  No cyanosis or clubbing noted. Pedal hair growth normal.  Neurologic Normal speech. Oriented to person, place, and time. Epicritic sensation to light touch grossly present bilaterally.  Dermatologic Nails well groomed and normal in appearance. No open wounds. No skin lesions.  Orthopedic: Pain on palpation of posterior Achilles tendon insertion.  Pain along the course of the tendon as well.  Pain with dorsiflexion of the ankle joint no pain with plantarflexion of the ankle joint.  No pain at the posterior tibial tendon, ATFL ligament, peroneal tendon.  Positive Silfverskiold test with gastrocnemius equinus   Radiographs: 3 views of skeletally mature adult left foot: Posterior heel spurring noted as well as plantar heel spurring.  Midfoot arthritis noted.  Pes planovalgus foot structure noted.  Haglund's deformity Assessment:  1. Achilles tendinitis, left leg    Plan:  Patient was evaluated and treated and all questions answered.  Left Achilles tendinitis with underlying Haglund's deformity and gastrocnemius equinus noted -All questions and concerns were discussed with the patient in extensive detail -Given the amount of pain that she is having she will benefit from cam boot immobilization.  If there is no improvement with cam boot immobilization we will discuss steroid injection versus MRI at that time.  Patient states understanding -Cam boot was dispensed  No follow-ups on file.

## 2021-05-30 DIAGNOSIS — R059 Cough, unspecified: Secondary | ICD-10-CM | POA: Diagnosis not present

## 2021-05-30 DIAGNOSIS — J029 Acute pharyngitis, unspecified: Secondary | ICD-10-CM | POA: Diagnosis not present

## 2021-06-01 DIAGNOSIS — J029 Acute pharyngitis, unspecified: Secondary | ICD-10-CM | POA: Diagnosis not present

## 2021-06-01 DIAGNOSIS — J039 Acute tonsillitis, unspecified: Secondary | ICD-10-CM | POA: Diagnosis not present

## 2021-06-08 DIAGNOSIS — H0011 Chalazion right upper eyelid: Secondary | ICD-10-CM | POA: Diagnosis not present

## 2021-06-22 ENCOUNTER — Encounter: Payer: Self-pay | Admitting: Podiatry

## 2021-06-22 ENCOUNTER — Other Ambulatory Visit: Payer: Self-pay

## 2021-06-22 ENCOUNTER — Ambulatory Visit: Payer: PPO | Admitting: Podiatry

## 2021-06-22 DIAGNOSIS — M7662 Achilles tendinitis, left leg: Secondary | ICD-10-CM | POA: Diagnosis not present

## 2021-06-22 DIAGNOSIS — M7732 Calcaneal spur, left foot: Secondary | ICD-10-CM | POA: Diagnosis not present

## 2021-06-22 NOTE — Progress Notes (Signed)
?Subjective:  ?Patient ID: [name], female    DOB: 07/09/1955,  MRN: [id number] ? ?Chief Complaint  ?Patient presents with  ? Foot Pain  ?  Left foot  ?Pt stated that she has had some improvement   ? ? ?66 y.o. female presents with the above complaint.  Patient presents for follow-up of left Achilles tendinitis.  She states she is doing a lot better.  The boot definitely helped.  She would like to discuss next treatment plan.  She is getting ready to go on a cruise. ? ? ?Review of Systems: Negative except as noted in the HPI. Denies N/V/F/Ch. ? ?Past Medical History:  ?Diagnosis Date  ? Asthma   ? Depression   ? Erosive esophagitis   ? Headaches, cluster   ? Hepatitis C   ? HTN (hypertension)   ? ? ?Current Outpatient Medications:  ?  Calcium Carb-Cholecalciferol (CALCIUM-VITAMIN D3) 600-400 MG-UNIT TABS, Take 1 tablet by mouth daily., Disp: , Rfl:  ?  chlorthalidone (HYGROTON) 25 MG tablet, Take 25 mg by mouth daily., Disp: , Rfl:  ?  EPINEPHrine 0.3 mg/0.3 mL IJ SOAJ injection, Inject 0.3 mg into the muscle as needed for anaphylaxis., Disp: 1 each, Rfl: 0 ?  metoprolol tartrate (LOPRESSOR) 50 MG tablet, Take 1.5 tablets (75 mg total) by mouth 2 (two) times daily., Disp: 270 tablet, Rfl: 3 ?  Multiple Vitamins-Minerals (MULTIVITAMIN PO), Take 1 tablet by mouth daily., Disp: , Rfl:  ?  naproxen sodium (ALEVE) 220 MG tablet, Take 220 mg by mouth 2 (two) times daily as needed (pain)., Disp: , Rfl:  ?  omeprazole (PRILOSEC) 40 MG capsule, Take 40 mg by mouth daily., Disp: , Rfl: 10 ?  promethazine-dextromethorphan (PROMETHAZINE-DM) 6.25-15 MG/5ML syrup, Take 5 mLs by mouth 4 (four) times daily as needed for cough., Disp: 180 mL, Rfl: 0 ?  Venlafaxine HCl 75 MG TB24, Take 37.5 mg by mouth daily., Disp: , Rfl:  ? ?Social History  ? ?Tobacco Use  ?Smoking Status Never  ?Smokeless Tobacco Never  ? ? ?Allergies  ?Allergen Reactions  ? Atenolol   ?  FATIGUE/ HYPOTENSION  ? Lisinopril   ?  COUGH/ WHEEZING  ? Paxil  [Paroxetine Hcl]   ?  INCREASED APPETITE FOR SWEETS  ? Zoloft [Sertraline Hcl]   ?  TINNITUS  ? Elemental Sulfur Rash  ?  FEVER  ? ?Objective:  ?There were no vitals filed for this visit. ?There is no height or weight on file to calculate BMI. ?Constitutional Well developed. ?Well nourished.  ?Vascular Dorsalis pedis pulses palpable bilaterally. ?Posterior tibial pulses palpable bilaterally. ?Capillary refill normal to all digits.  ?No cyanosis or clubbing noted. ?Pedal hair growth normal.  ?Neurologic Normal speech. ?Oriented to person, place, and time. ?Epicritic sensation to light touch grossly present bilaterally.  ?Dermatologic Nails well groomed and normal in appearance. ?No open wounds. ?No skin lesions.  ?Orthopedic: Pain on palpation of posterior Achilles tendon insertion.  Pain along the course of the tendon as well.  Pain with dorsiflexion of the ankle joint no pain with plantarflexion of the ankle joint.  No pain at the posterior tibial tendon, ATFL ligament, peroneal tendon.  Positive Silfverskiold test with gastrocnemius equinus  ? ?Radiographs: 3 views of skeletally mature adult left foot: Posterior heel spurring noted as well as plantar heel spurring.  Midfoot arthritis noted.  Pes planovalgus foot structure noted.  Haglund's deformity ?Assessment:  ? ?No diagnosis found. ? ?Plan:  ?Patient was evaluated and treated  and all questions answered. ? ?Left Achilles tendinitis with underlying Haglund's deformity and gastrocnemius equinus noted ?-All questions and concerns were discussed with the patient in extensive detail ?-Clinically her pain is improving but she still has some residual pain.  At this time I discussed with her that she will benefit from a steroid injection to help decrease acute inflammatory component associate with pain.  Patient agrees with the plan.  I discussed with her given that this is near the tendon there is a risk of rupture associated with it.  She states understanding. ?-A  steroid injection was performed at [facility] fat pad left using 1% plain Lidocaine and 10 mg of Kenalog. This was well tolerated. ?-Continue wearing cam boot as needed. ? ? ?No follow-ups on file.  ?

## 2021-07-06 ENCOUNTER — Other Ambulatory Visit: Payer: Self-pay | Admitting: Cardiovascular Disease

## 2021-09-13 DIAGNOSIS — H26491 Other secondary cataract, right eye: Secondary | ICD-10-CM | POA: Diagnosis not present

## 2021-09-13 DIAGNOSIS — Z961 Presence of intraocular lens: Secondary | ICD-10-CM | POA: Diagnosis not present

## 2021-09-21 ENCOUNTER — Other Ambulatory Visit: Payer: Self-pay | Admitting: Obstetrics and Gynecology

## 2021-09-21 DIAGNOSIS — Z1231 Encounter for screening mammogram for malignant neoplasm of breast: Secondary | ICD-10-CM

## 2021-09-23 ENCOUNTER — Ambulatory Visit
Admission: RE | Admit: 2021-09-23 | Discharge: 2021-09-23 | Disposition: A | Discharge status: Home / Self Care | Payer: PPO | Source: Ambulatory Visit

## 2021-09-23 DIAGNOSIS — Z1231 Encounter for screening mammogram for malignant neoplasm of breast: Secondary | ICD-10-CM

## 2021-10-20 ENCOUNTER — Ambulatory Visit: Payer: PPO | Admitting: Podiatry

## 2021-10-20 ENCOUNTER — Encounter: Payer: Self-pay | Admitting: Podiatry

## 2021-10-20 DIAGNOSIS — S86012A Strain of left Achilles tendon, initial encounter: Secondary | ICD-10-CM

## 2021-10-20 DIAGNOSIS — S9032XA Contusion of left foot, initial encounter: Secondary | ICD-10-CM

## 2021-10-20 MED ORDER — METHYLPREDNISOLONE 4 MG PO TBPK: ORAL_TABLET | ORAL | 0 refills | Status: DC

## 2021-10-20 NOTE — Progress Notes (Signed)
This is a patient well-known to Dr. Posey Pronto but he wishes to see me at this point.  She states that the shot he gave her really helped a lot made her feel much better but she immediately went back down to 0 when she was walking on the beach and she feels that the sand may have irritated it.  Objective: Vital signs are stable she is alert and oriented x3.  She has pain on palpation to the posterior lateral aspect of the left heel with thickening of the Achilles on palpation it also feels that there is some fluctuance to the area most likely bursitis.  She has tenderness on sharp dorsiflexion and plantarflexion against resistance.  Previous radiographs do demonstrate soft tissue increase in density in this area.  Assessment: Failure of conservative therapies to alleviate this chronic tendinitis questions as to whether or not there may be a tear.  Plan: At this point started her on methylprednisolone encouraged her to get back into her cam walker.  I also am requesting an MRI for surgical consideration probable tear of the Achilles tendon at the very least insertional fiber tears most likely interstitial.

## 2021-10-27 ENCOUNTER — Ambulatory Visit: Payer: PPO | Admitting: Podiatry

## 2021-10-29 ENCOUNTER — Ambulatory Visit
Admission: RE | Admit: 2021-10-29 | Discharge: 2021-10-29 | Disposition: A | Discharge status: Home / Self Care | Payer: PPO | Source: Ambulatory Visit | Attending: Podiatry | Admitting: Podiatry

## 2021-10-29 DIAGNOSIS — S86012A Strain of left Achilles tendon, initial encounter: Secondary | ICD-10-CM

## 2021-10-29 DIAGNOSIS — M7732 Calcaneal spur, left foot: Secondary | ICD-10-CM | POA: Diagnosis not present

## 2021-11-01 ENCOUNTER — Telehealth: Payer: Self-pay | Admitting: *Deleted

## 2021-11-01 ENCOUNTER — Other Ambulatory Visit: Payer: Self-pay | Admitting: Podiatry

## 2021-11-01 MED ORDER — ACETAMINOPHEN-CODEINE 300-30 MG PO TABS: 1.0000 | ORAL_TABLET | ORAL | 0 refills | Status: DC | PRN

## 2021-11-01 NOTE — Telephone Encounter (Signed)
Patient is calling for MRI results, still in pain. Please advise.

## 2021-11-09 ENCOUNTER — Ambulatory Visit: Payer: PPO | Admitting: Podiatry

## 2021-11-09 DIAGNOSIS — M7662 Achilles tendinitis, left leg: Secondary | ICD-10-CM

## 2021-11-09 DIAGNOSIS — M9262 Juvenile osteochondrosis of tarsus, left ankle: Secondary | ICD-10-CM | POA: Diagnosis not present

## 2021-11-09 DIAGNOSIS — Z01818 Encounter for other preprocedural examination: Secondary | ICD-10-CM | POA: Diagnosis not present

## 2021-11-09 DIAGNOSIS — S86012A Strain of left Achilles tendon, initial encounter: Secondary | ICD-10-CM

## 2021-11-09 NOTE — Progress Notes (Signed)
Subjective:  Patient ID: [name], female    DOB: 1955/12/17,  MRN: [id number]  Chief Complaint  Patient presents with   Routine Post Op    Left achilles tendonitis, tender to the touch    66 y.o. female presents with the above complaint.  Patient presents for follow-up of left Achilles tendinitis.  She states that it has regressed and started hurting her more.  She went to the beach and stepped on a speed bump that led to further dorsiflexion of the foot and the tearing and pain.  She states the foot is very tender to touch she would like to discuss surgical options she had an MRI done which shows some tearing in the Achilles tendon.  She denies any other acute complaints   Review of Systems: Negative except as noted in the HPI. Denies N/V/F/Ch.  Past Medical History:  Diagnosis Date   Asthma    Depression    Erosive esophagitis    Headaches, cluster    Hepatitis C    HTN (hypertension)     Current Outpatient Medications:    acetaminophen-codeine (TYLENOL #3) 300-30 MG tablet, Take 1-2 tablets by mouth every 4 (four) hours as needed for moderate pain., Disp: 30 tablet, Rfl: 0   Calcium Carb-Cholecalciferol (CALCIUM-VITAMIN D3) 600-400 MG-UNIT TABS, Take 1 tablet by mouth daily., Disp: , Rfl:    chlorthalidone (HYGROTON) 25 MG tablet, Take 25 mg by mouth daily., Disp: , Rfl:    EPINEPHrine 0.3 mg/0.3 mL IJ SOAJ injection, Inject 0.3 mg into the muscle as needed for anaphylaxis., Disp: 1 each, Rfl: 0   methylPREDNISolone (MEDROL DOSEPAK) 4 MG TBPK tablet, 6 day dose pack - take as directed, Disp: 21 tablet, Rfl: 0   metoprolol tartrate (LOPRESSOR) 50 MG tablet, TAKE ONE AND ONE-HALF TABLETS BY MOUTH TWO TIMES A DAY, Disp: 270 tablet, Rfl: 2   Multiple Vitamins-Minerals (MULTIVITAMIN PO), Take 1 tablet by mouth daily., Disp: , Rfl:    naproxen (NAPROSYN) 500 MG tablet, Take 500 mg by mouth 2 (two) times daily., Disp: , Rfl:    naproxen sodium (ALEVE) 220 MG tablet, Take 220 mg  by mouth 2 (two) times daily as needed (pain)., Disp: , Rfl:    omeprazole (PRILOSEC) 40 MG capsule, Take 40 mg by mouth daily., Disp: , Rfl: 10   promethazine-dextromethorphan (PROMETHAZINE-DM) 6.25-15 MG/5ML syrup, Take 5 mLs by mouth 4 (four) times daily as needed for cough., Disp: 180 mL, Rfl: 0   Venlafaxine HCl 75 MG TB24, Take 37.5 mg by mouth daily., Disp: , Rfl:   Social History   Tobacco Use  Smoking Status Never  Smokeless Tobacco Never    Allergies  Allergen Reactions   Atenolol     FATIGUE/ HYPOTENSION   Lisinopril     COUGH/ WHEEZING   Paxil [Paroxetine Hcl]     INCREASED APPETITE FOR SWEETS   Zoloft [Sertraline Hcl]     TINNITUS   Elemental Sulfur Rash    FEVER   Objective:  There were no vitals filed for this visit. There is no height or weight on file to calculate BMI. Constitutional Well developed. Well nourished.  Vascular Dorsalis pedis pulses palpable bilaterally. Posterior tibial pulses palpable bilaterally. Capillary refill normal to all digits.  No cyanosis or clubbing noted. Pedal hair growth normal.  Neurologic Normal speech. Oriented to person, place, and time. Epicritic sensation to light touch grossly present bilaterally.  Dermatologic Nails well groomed and normal in appearance. No open wounds. No  skin lesions.  Orthopedic: Pain on palpation of posterior Achilles tendon insertion.  Pain along the course of the tendon as well.  Pain with dorsiflexion of the ankle joint no pain with plantarflexion of the ankle joint.  No pain at the posterior tibial tendon, ATFL ligament, peroneal tendon.  Positive Silfverskiold test with gastrocnemius equinus   Radiographs: 3 views of skeletally mature adult left foot: Posterior heel spurring noted as well as plantar heel spurring.  Midfoot arthritis noted.  Pes planovalgus foot structure noted.  Haglund's deformity Mild acute tendinosis and peritendinitis with focal high-grade outer surface tear of the  Achilles tendon medial fibers at the calcaneal insertion.   Thickened central bundle plantar fascia without edema signal consistent with prior/chronic plantar fasciitis.   Intact ankle ligaments.  Mild tibiotalar and midfoot osteoarthritis. Assessment:   1. Rupture of left Achilles tendon, initial encounter   2. Achilles tendinitis, left leg   3. Haglund's deformity, left   4. Encounter for preoperative examination for general surgical procedure     Plan:  Patient was evaluated and treated and all questions answered.  Left Achilles tendinitis with underlying Haglund's deformity and gastrocnemius equinus noted -All questions and concerns were discussed with the patient in extensive detail -MRI was reviewed which show focal high-grade undersurface tear of the Achilles tendon.  At this time I discussed surgical options for this patient.  Given that the tear is more near the insertion I believe she will benefit from aggressive debridement of the Haglund's deformity with removal of all of the posterior spur as well as Achilles tendon repair and gastrocnemius recession to address the equinus.  I discussed my preoperative intraoperative postop plan in extensive detail she states understanding would like to proceed with surgery. -Informed surgical risk consent was reviewed and read aloud to the patient.  I reviewed the films.  I have discussed my findings with the patient in great detail.  I have discussed all risks including but not limited to infection, stiffness, scarring, limp, disability, deformity, damage to blood vessels and nerves, numbness, poor healing, need for braces, arthritis, chronic pain, amputation, death.  All benefits and realistic expectations discussed in great detail.  I have made no promises as to the outcome.  I have provided realistic expectations.  I have offered the patient a 2nd opinion, which they have declined and assured me they preferred to proceed despite the  risks   No follow-ups on file.

## 2021-11-15 DIAGNOSIS — Z01419 Encounter for gynecological examination (general) (routine) without abnormal findings: Secondary | ICD-10-CM | POA: Diagnosis not present

## 2021-11-15 DIAGNOSIS — L304 Erythema intertrigo: Secondary | ICD-10-CM | POA: Diagnosis not present

## 2021-11-15 DIAGNOSIS — N952 Postmenopausal atrophic vaginitis: Secondary | ICD-10-CM | POA: Diagnosis not present

## 2021-11-21 ENCOUNTER — Telehealth: Payer: Self-pay | Admitting: Urology

## 2021-11-21 NOTE — Telephone Encounter (Signed)
DOS - 12/12/21  REPAIR ACHILLES TENDON LEFT --- 35573 GASTROCNEMIUS RECESS LEFT --- 22025 CALCANEAL OSTEOTOMY LEFT --- 42706  [facility]A EFFECTIVE DATE - 07/23/21  RECEIVED FAX FROM [facility] STATING THAT CPT CODES 23762, 83151 AND 76160 HAVE BEEN APPROVED, AUTH # [id number], GOOD FROM 12/12/21 - 03/12/22.

## 2021-11-30 ENCOUNTER — Other Ambulatory Visit: Payer: Self-pay | Admitting: Podiatry

## 2021-12-02 ENCOUNTER — Telehealth: Payer: Self-pay | Admitting: Podiatry

## 2021-12-02 NOTE — Telephone Encounter (Signed)
Pt was wanting a refill of her Rx for pain  Please advise

## 2021-12-06 ENCOUNTER — Telehealth: Payer: Self-pay | Admitting: Podiatry

## 2021-12-06 MED ORDER — ACETAMINOPHEN-CODEINE 300-30 MG PO TABS: 1.0000 | ORAL_TABLET | ORAL | 0 refills | Status: DC | PRN

## 2021-12-06 NOTE — Telephone Encounter (Signed)
Pt would like a refill of acetaminophen-codeine (TYLENOL #3) 300-30 MG tablet  Sent to [facility] [id number] - [place], [address] states she has left several message of this request and called pharmacy to send over the request.   Please advise.

## 2021-12-06 NOTE — Addendum Note (Signed)
Addended by: Boneta Lucks on: 12/06/2021 10:49 AM   Modules accepted: Orders

## 2021-12-12 ENCOUNTER — Other Ambulatory Visit: Payer: Self-pay | Admitting: Podiatry

## 2021-12-12 DIAGNOSIS — M7662 Achilles tendinitis, left leg: Secondary | ICD-10-CM | POA: Diagnosis not present

## 2021-12-12 DIAGNOSIS — S86012A Strain of left Achilles tendon, initial encounter: Secondary | ICD-10-CM | POA: Diagnosis not present

## 2021-12-12 DIAGNOSIS — M7732 Calcaneal spur, left foot: Secondary | ICD-10-CM | POA: Diagnosis not present

## 2021-12-12 DIAGNOSIS — M205X2 Other deformities of toe(s) (acquired), left foot: Secondary | ICD-10-CM | POA: Diagnosis not present

## 2021-12-12 DIAGNOSIS — M216X2 Other acquired deformities of left foot: Secondary | ICD-10-CM | POA: Diagnosis not present

## 2021-12-12 DIAGNOSIS — G8918 Other acute postprocedural pain: Secondary | ICD-10-CM | POA: Diagnosis not present

## 2021-12-12 MED ORDER — IBUPROFEN 800 MG PO TABS: 800.0000 mg | ORAL_TABLET | Freq: Four times a day (QID) | ORAL | 1 refills | Status: AC | PRN

## 2021-12-12 MED ORDER — OXYCODONE-ACETAMINOPHEN 5-325 MG PO TABS: 1.0000 | ORAL_TABLET | ORAL | 0 refills | Status: DC | PRN

## 2021-12-13 ENCOUNTER — Telehealth: Payer: Self-pay | Admitting: *Deleted

## 2021-12-13 NOTE — Telephone Encounter (Signed)
Patient is calling to ask for a blood thinner(xarelto )since her family has a history of developing blood clots, surgery patient one day ago. Please advise.

## 2021-12-14 ENCOUNTER — Other Ambulatory Visit: Payer: Self-pay | Admitting: Podiatry

## 2021-12-14 MED ORDER — RIVAROXABAN 10 MG PO TABS: 10.0000 mg | ORAL_TABLET | Freq: Every day | ORAL | 0 refills | Status: DC

## 2021-12-14 NOTE — Telephone Encounter (Signed)
Patient notified,said that she is having a little more swelling this morning, encouraged her to elevate more often, place the ice pack behind the knee to help, verbalized understanding.

## 2021-12-15 ENCOUNTER — Telehealth: Payer: Self-pay

## 2021-12-15 ENCOUNTER — Telehealth: Payer: Self-pay | Admitting: *Deleted

## 2021-12-15 NOTE — Telephone Encounter (Signed)
Called and spoke with patient giving Dr Serita Grit recommendations to discontinue the ibuprofen,take the oxycodone- ace-q 4 hours prn, verbalized understanding also explained to her to keep foot elevated, icing behind the knee, to stay off foot .

## 2021-12-15 NOTE — Telephone Encounter (Signed)
Informed patient, verbalized understanding.

## 2021-12-15 NOTE — Telephone Encounter (Signed)
Message has been routed to the appropriate staff to address.

## 2021-12-16 ENCOUNTER — Encounter: Payer: Self-pay | Admitting: Podiatry

## 2021-12-21 ENCOUNTER — Ambulatory Visit (INDEPENDENT_AMBULATORY_CARE_PROVIDER_SITE_OTHER): Payer: PPO | Admitting: Podiatry

## 2021-12-21 ENCOUNTER — Ambulatory Visit (INDEPENDENT_AMBULATORY_CARE_PROVIDER_SITE_OTHER): Payer: PPO

## 2021-12-21 DIAGNOSIS — S86012A Strain of left Achilles tendon, initial encounter: Secondary | ICD-10-CM

## 2021-12-21 DIAGNOSIS — M7662 Achilles tendinitis, left leg: Secondary | ICD-10-CM

## 2021-12-21 DIAGNOSIS — Z9889 Other specified postprocedural states: Secondary | ICD-10-CM

## 2021-12-21 DIAGNOSIS — M9262 Juvenile osteochondrosis of tarsus, left ankle: Secondary | ICD-10-CM

## 2021-12-21 MED ORDER — GABAPENTIN 100 MG PO CAPS: 100.0000 mg | ORAL_CAPSULE | Freq: Three times a day (TID) | ORAL | 3 refills | Status: DC

## 2021-12-21 MED ORDER — DOXYCYCLINE HYCLATE 100 MG PO TABS: 100.0000 mg | ORAL_TABLET | Freq: Two times a day (BID) | ORAL | 0 refills | Status: DC

## 2021-12-21 MED ORDER — OXYCODONE-ACETAMINOPHEN 10-325 MG PO TABS: 1.0000 | ORAL_TABLET | ORAL | 0 refills | Status: DC | PRN

## 2021-12-21 NOTE — Progress Notes (Addendum)
Subjective:  Patient ID: [name], female    DOB: Feb 22, 1956,  MRN: [id number]  No chief complaint on file.   DOS: 12/12/2021 Procedure: Left Achilles tendon repair with gastrocnemius recession with Haglund's resection  66 y.o. female returns for post-op check.  patient states she is doing okay.  Her pain is not controlled.  She states that she ran out of pain medication and her pain has been increasing.  She wanted to get it evaluated bandages clean dry and intact there are some redness around the incision site.  Review of Systems: Negative except as noted in the HPI. Denies N/V/F/Ch.  Past Medical History:  Diagnosis Date   Asthma    Depression    Erosive esophagitis    Headaches, cluster    Hepatitis C    HTN (hypertension)     Current Outpatient Medications:    doxycycline (VIBRA-TABS) 100 MG tablet, Take 1 tablet (100 mg total) by mouth 2 (two) times daily., Disp: 28 tablet, Rfl: 0   gabapentin (NEURONTIN) 100 MG capsule, Take 1 capsule (100 mg total) by mouth 3 (three) times daily., Disp: 90 capsule, Rfl: 3   oxyCODONE-acetaminophen (PERCOCET) 10-325 MG tablet, Take 1 tablet by mouth every 4 (four) hours as needed for pain., Disp: 30 tablet, Rfl: 0   acetaminophen-codeine (TYLENOL #3) 300-30 MG tablet, Take 1-2 tablets by mouth every 4 (four) hours as needed for moderate pain., Disp: 30 tablet, Rfl: 0   acetaminophen-codeine (TYLENOL #3) 300-30 MG tablet, Take 1-2 tablets by mouth every 4 (four) hours as needed for moderate pain., Disp: 30 tablet, Rfl: 0   Calcium Carb-Cholecalciferol (CALCIUM-VITAMIN D3) 600-400 MG-UNIT TABS, Take 1 tablet by mouth daily., Disp: , Rfl:    chlorthalidone (HYGROTON) 25 MG tablet, Take 25 mg by mouth daily., Disp: , Rfl:    EPINEPHrine 0.3 mg/0.3 mL IJ SOAJ injection, Inject 0.3 mg into the muscle as needed for anaphylaxis., Disp: 1 each, Rfl: 0   ibuprofen (ADVIL) 800 MG tablet, Take 1 tablet (800 mg total) by mouth every 6 (six) hours as  needed., Disp: 60 tablet, Rfl: 1   methylPREDNISolone (MEDROL DOSEPAK) 4 MG TBPK tablet, 6 day dose pack - take as directed, Disp: 21 tablet, Rfl: 0   metoprolol tartrate (LOPRESSOR) 50 MG tablet, TAKE ONE AND ONE-HALF TABLETS BY MOUTH TWO TIMES A DAY, Disp: 270 tablet, Rfl: 2   Multiple Vitamins-Minerals (MULTIVITAMIN PO), Take 1 tablet by mouth daily., Disp: , Rfl:    naproxen (NAPROSYN) 500 MG tablet, Take 500 mg by mouth 2 (two) times daily., Disp: , Rfl:    naproxen sodium (ALEVE) 220 MG tablet, Take 220 mg by mouth 2 (two) times daily as needed (pain)., Disp: , Rfl:    omeprazole (PRILOSEC) 40 MG capsule, Take 40 mg by mouth daily., Disp: , Rfl: 10   oxyCODONE-acetaminophen (PERCOCET) 5-325 MG tablet, Take 1 tablet by mouth every 4 (four) hours as needed for severe pain., Disp: 30 tablet, Rfl: 0   promethazine-dextromethorphan (PROMETHAZINE-DM) 6.25-15 MG/5ML syrup, Take 5 mLs by mouth 4 (four) times daily as needed for cough., Disp: 180 mL, Rfl: 0   rivaroxaban (XARELTO) 10 MG TABS tablet, Take 1 tablet (10 mg total) by mouth daily., Disp: 30 tablet, Rfl: 0   Venlafaxine HCl 75 MG TB24, Take 37.5 mg by mouth daily., Disp: , Rfl:   Social History   Tobacco Use  Smoking Status Never  Smokeless Tobacco Never    Allergies  Allergen Reactions  Atenolol     FATIGUE/ HYPOTENSION   Lisinopril     COUGH/ WHEEZING   Paxil [Paroxetine Hcl]     INCREASED APPETITE FOR SWEETS   Zoloft [Sertraline Hcl]     TINNITUS   Elemental Sulfur Rash    FEVER   Objective:  There were no vitals filed for this visit. There is no height or weight on file to calculate BMI. Constitutional Well developed. Well nourished.  Vascular Foot warm and well perfused. Capillary refill normal to all digits.   Neurologic Normal speech. Oriented to person, place, and time. Epicritic sensation to light touch grossly present bilaterally.  Dermatologic Skin healing well without signs of infection. Skin edges well  coapted without signs of infection.  Erythema noted to the left ankle  Orthopedic: Tenderness to palpation noted about the surgical site.   Radiographs: 3 views of skeletally mature adult left foot: Resection of Haglund's noted  Assessment:   1. Rupture of left Achilles tendon, initial encounter   2. Achilles tendinitis, left leg   3. Haglund's deformity, left   4. Status post foot surgery    Plan:  Patient was evaluated and treated and all questions answered.  S/p foot surgery left -Progressing as expected post-operatively. -XR: See above -WB Status: Nonweightbearing in left lower extremity -Sutures: Intact.  No clinical signs of Deis is noted.  No complication noted. -Medications: None -Patient will do Betadine wet-to-dry dressing change daily.  There is some redness associated with the incision site.  Doxycycline was sent to the pharmacy for 14 days.  I encouraged her to finish it.  She states understanding. -The wound measurement is 10 cm x 2 cm x 0.2 cm  No follow-ups on file.

## 2021-12-23 ENCOUNTER — Other Ambulatory Visit: Payer: Self-pay | Admitting: Podiatry

## 2021-12-23 ENCOUNTER — Encounter: Payer: Self-pay | Admitting: Podiatry

## 2021-12-23 MED ORDER — LEVOFLOXACIN 500 MG PO TABS: 500.0000 mg | ORAL_TABLET | Freq: Every day | ORAL | 0 refills | Status: DC

## 2021-12-23 NOTE — Progress Notes (Signed)
Patient calling in concerned that she has no improvement in foot on antibiotics taken since Wednesday. Has been dressing daily with betadine. Will add on levaquin as patient with allergies and interaction for other alternatives. Discussed twice daily dressing changes and if continues to have trouble in next couple days should head to the ED.

## 2021-12-25 ENCOUNTER — Emergency Department (HOSPITAL_COMMUNITY): Payer: PPO

## 2021-12-25 ENCOUNTER — Encounter (HOSPITAL_COMMUNITY): Payer: Self-pay | Admitting: Emergency Medicine

## 2021-12-25 ENCOUNTER — Other Ambulatory Visit: Payer: Self-pay

## 2021-12-25 ENCOUNTER — Inpatient Hospital Stay (HOSPITAL_COMMUNITY)
Admission: EM | Admit: 2021-12-25 | Discharge: 2021-12-29 | DRG: 863 | Disposition: A | Discharge status: Home Health | Payer: PPO | Attending: Internal Medicine | Admitting: Internal Medicine

## 2021-12-25 DIAGNOSIS — J45909 Unspecified asthma, uncomplicated: Secondary | ICD-10-CM | POA: Diagnosis not present

## 2021-12-25 DIAGNOSIS — M7732 Calcaneal spur, left foot: Secondary | ICD-10-CM | POA: Diagnosis not present

## 2021-12-25 DIAGNOSIS — R739 Hyperglycemia, unspecified: Secondary | ICD-10-CM | POA: Diagnosis not present

## 2021-12-25 DIAGNOSIS — E669 Obesity, unspecified: Secondary | ICD-10-CM | POA: Diagnosis not present

## 2021-12-25 DIAGNOSIS — Z8619 Personal history of other infectious and parasitic diseases: Secondary | ICD-10-CM

## 2021-12-25 DIAGNOSIS — I10 Essential (primary) hypertension: Secondary | ICD-10-CM | POA: Diagnosis not present

## 2021-12-25 DIAGNOSIS — Z8249 Family history of ischemic heart disease and other diseases of the circulatory system: Secondary | ICD-10-CM | POA: Diagnosis not present

## 2021-12-25 DIAGNOSIS — T8149XA Infection following a procedure, other surgical site, initial encounter: Secondary | ICD-10-CM | POA: Diagnosis present

## 2021-12-25 DIAGNOSIS — X58XXXA Exposure to other specified factors, initial encounter: Secondary | ICD-10-CM | POA: Diagnosis present

## 2021-12-25 DIAGNOSIS — Z79899 Other long term (current) drug therapy: Secondary | ICD-10-CM | POA: Diagnosis not present

## 2021-12-25 DIAGNOSIS — F32A Depression, unspecified: Secondary | ICD-10-CM | POA: Diagnosis not present

## 2021-12-25 DIAGNOSIS — Z825 Family history of asthma and other chronic lower respiratory diseases: Secondary | ICD-10-CM | POA: Diagnosis not present

## 2021-12-25 DIAGNOSIS — E876 Hypokalemia: Secondary | ICD-10-CM | POA: Diagnosis not present

## 2021-12-25 DIAGNOSIS — S91002A Unspecified open wound, left ankle, initial encounter: Secondary | ICD-10-CM | POA: Diagnosis not present

## 2021-12-25 DIAGNOSIS — I739 Peripheral vascular disease, unspecified: Secondary | ICD-10-CM | POA: Diagnosis not present

## 2021-12-25 DIAGNOSIS — R7303 Prediabetes: Secondary | ICD-10-CM | POA: Diagnosis not present

## 2021-12-25 DIAGNOSIS — T8141XA Infection following a procedure, superficial incisional surgical site, initial encounter: Principal | ICD-10-CM | POA: Diagnosis present

## 2021-12-25 DIAGNOSIS — Z823 Family history of stroke: Secondary | ICD-10-CM | POA: Diagnosis not present

## 2021-12-25 DIAGNOSIS — T8131XA Disruption of external operation (surgical) wound, not elsewhere classified, initial encounter: Secondary | ICD-10-CM | POA: Diagnosis not present

## 2021-12-25 DIAGNOSIS — Z8719 Personal history of other diseases of the digestive system: Secondary | ICD-10-CM

## 2021-12-25 DIAGNOSIS — Z6838 Body mass index (BMI) 38.0-38.9, adult: Secondary | ICD-10-CM | POA: Diagnosis not present

## 2021-12-25 DIAGNOSIS — S86012A Strain of left Achilles tendon, initial encounter: Secondary | ICD-10-CM | POA: Diagnosis not present

## 2021-12-25 DIAGNOSIS — Z833 Family history of diabetes mellitus: Secondary | ICD-10-CM | POA: Diagnosis not present

## 2021-12-25 DIAGNOSIS — L03116 Cellulitis of left lower limb: Secondary | ICD-10-CM | POA: Diagnosis not present

## 2021-12-25 DIAGNOSIS — Z9889 Other specified postprocedural states: Secondary | ICD-10-CM | POA: Diagnosis not present

## 2021-12-25 DIAGNOSIS — Y838 Other surgical procedures as the cause of abnormal reaction of the patient, or of later complication, without mention of misadventure at the time of the procedure: Secondary | ICD-10-CM | POA: Diagnosis present

## 2021-12-25 DIAGNOSIS — M7989 Other specified soft tissue disorders: Secondary | ICD-10-CM | POA: Diagnosis not present

## 2021-12-25 LAB — CBC
HCT: 41.4 % (ref 36.0–46.0)
HCT: 39.7 % (ref 36.0–46.0)
Hemoglobin: 13.9 g/dL (ref 12.0–15.0)
Hemoglobin: 13.2 g/dL (ref 12.0–15.0)
MCH: 29.4 pg (ref 26.0–34.0)
MCH: 29.3 pg (ref 26.0–34.0)
MCHC: 33.6 g/dL (ref 30.0–36.0)
MCHC: 33.2 g/dL (ref 30.0–36.0)
MCV: 87.5 fL (ref 80.0–100.0)
MCV: 88 fL (ref 80.0–100.0)
Platelets: 338 10*3/uL (ref 150–400)
Platelets: 300 10*3/uL (ref 150–400)
RBC: 4.73 MIL/uL (ref 3.87–5.11)
RBC: 4.51 MIL/uL (ref 3.87–5.11)
RDW: 13.3 % (ref 11.5–15.5)
RDW: 13.5 % (ref 11.5–15.5)
WBC: 8.4 10*3/uL (ref 4.0–10.5)
WBC: 9.3 10*3/uL (ref 4.0–10.5)
nRBC: 0 % (ref 0.0–0.2)
nRBC: 0 % (ref 0.0–0.2)

## 2021-12-25 LAB — BASIC METABOLIC PANEL
Anion gap: 13 (ref 5–15)
BUN: 29 mg/dL — ABNORMAL HIGH (ref 8–23)
CO2: 30 mmol/L (ref 22–32)
Calcium: 9.5 mg/dL (ref 8.9–10.3)
Chloride: 99 mmol/L (ref 98–111)
Creatinine, Ser: 0.87 mg/dL (ref 0.44–1.00)
GFR, Estimated: >60 mL/min (ref >60)
Glucose, Bld: 136 mg/dL — ABNORMAL HIGH (ref 70–99)
Potassium: 2.7 mmol/L — CL (ref 3.5–5.1)
Sodium: 142 mmol/L (ref 135–145)

## 2021-12-25 LAB — CREATININE, SERUM
Creatinine, Ser: 0.75 mg/dL (ref 0.44–1.00)
GFR, Estimated: >60 mL/min (ref >60)

## 2021-12-25 LAB — LACTIC ACID, PLASMA
Lactic Acid, Venous: 2.7 mmol/L (ref 0.5–1.9)
Lactic Acid, Venous: 2.2 mmol/L (ref 0.5–1.9)

## 2021-12-25 LAB — SEDIMENTATION RATE: Sed Rate: 23 mm/hr — ABNORMAL HIGH (ref 0–22)

## 2021-12-25 MED ORDER — CELECOXIB 200 MG PO CAPS
200.0000 mg | ORAL_CAPSULE | Freq: Two times a day (BID) | ORAL | Status: DC
  Administered 2021-12-26: 200 mg via ORAL
  Filled 2021-12-25 (×5): qty 1

## 2021-12-25 MED ORDER — SODIUM CHLORIDE 0.9 % IV SOLN
2.0000 g | Freq: Once | INTRAVENOUS | Status: AC
  Administered 2021-12-25: 2 g via INTRAVENOUS
  Filled 2021-12-25: qty 20

## 2021-12-25 MED ORDER — OXYCODONE HCL 5 MG PO TABS
5.0000 mg | ORAL_TABLET | ORAL | Status: DC | PRN
  Administered 2021-12-25 – 2021-12-26 (×3): 5 mg via ORAL
  Filled 2021-12-25 (×3): qty 1

## 2021-12-25 MED ORDER — PANTOPRAZOLE SODIUM 40 MG PO TBEC
40.0000 mg | DELAYED_RELEASE_TABLET | Freq: Every day | ORAL | Status: DC
  Administered 2021-12-26 – 2021-12-29 (×4): 40 mg via ORAL
  Filled 2021-12-25 (×4): qty 1

## 2021-12-25 MED ORDER — POTASSIUM CHLORIDE 10 MEQ/100ML IV SOLN
10.0000 meq | INTRAVENOUS | Status: DC
  Administered 2021-12-25 (×2): 10 meq via INTRAVENOUS
  Filled 2021-12-25 (×2): qty 100

## 2021-12-25 MED ORDER — SODIUM CHLORIDE 0.9 % IV BOLUS
1000.0000 mL | Freq: Once | INTRAVENOUS | Status: AC
  Administered 2021-12-25: 1000 mL via INTRAVENOUS

## 2021-12-25 MED ORDER — ACETAMINOPHEN 325 MG PO TABS
650.0000 mg | ORAL_TABLET | Freq: Four times a day (QID) | ORAL | Status: DC
  Administered 2021-12-25 – 2021-12-29 (×14): 650 mg via ORAL
  Filled 2021-12-25 (×14): qty 2

## 2021-12-25 MED ORDER — METOPROLOL TARTRATE 50 MG PO TABS
75.0000 mg | ORAL_TABLET | Freq: Two times a day (BID) | ORAL | Status: DC
  Administered 2021-12-25 – 2021-12-29 (×8): 75 mg via ORAL
  Filled 2021-12-25 (×8): qty 1

## 2021-12-25 MED ORDER — VANCOMYCIN HCL 2000 MG/400ML IV SOLN
2000.0000 mg | Freq: Once | INTRAVENOUS | Status: AC
  Administered 2021-12-25: 2000 mg via INTRAVENOUS
  Filled 2021-12-25: qty 400

## 2021-12-25 MED ORDER — OYSTER SHELL CALCIUM/D3 500-5 MG-MCG PO TABS
1.0000 | ORAL_TABLET | Freq: Every day | ORAL | Status: DC
  Administered 2021-12-26 – 2021-12-29 (×4): 1 via ORAL
  Filled 2021-12-25 (×4): qty 1

## 2021-12-25 MED ORDER — ONDANSETRON HCL 4 MG PO TABS: 4.0000 mg | ORAL_TABLET | Freq: Four times a day (QID) | ORAL | Status: DC | PRN

## 2021-12-25 MED ORDER — GABAPENTIN 100 MG PO CAPS
100.0000 mg | ORAL_CAPSULE | Freq: Three times a day (TID) | ORAL | Status: DC
  Administered 2021-12-25 – 2021-12-29 (×11): 100 mg via ORAL
  Filled 2021-12-25 (×11): qty 1

## 2021-12-25 MED ORDER — VENLAFAXINE HCL ER 37.5 MG PO CP24
37.5000 mg | ORAL_CAPSULE | Freq: Every day | ORAL | Status: DC
  Administered 2021-12-26 – 2021-12-29 (×4): 37.5 mg via ORAL
  Filled 2021-12-25 (×4): qty 1

## 2021-12-25 MED ORDER — ADULT MULTIVITAMIN LIQUID CH
15.0000 mL | Freq: Every day | ORAL | Status: DC
  Administered 2021-12-26 – 2021-12-29 (×4): 15 mL via ORAL
  Filled 2021-12-25 (×4): qty 15

## 2021-12-25 MED ORDER — POTASSIUM CHLORIDE CRYS ER 20 MEQ PO TBCR
40.0000 meq | EXTENDED_RELEASE_TABLET | ORAL | Status: AC
  Administered 2021-12-25: 40 meq via ORAL
  Filled 2021-12-25: qty 2

## 2021-12-25 MED ORDER — ACETAMINOPHEN 325 MG PO TABS
650.0000 mg | ORAL_TABLET | Freq: Once | ORAL | Status: AC
  Administered 2021-12-25: 650 mg via ORAL
  Filled 2021-12-25: qty 2

## 2021-12-25 MED ORDER — DIPHENHYDRAMINE HCL 50 MG/ML IJ SOLN
12.5000 mg | Freq: Four times a day (QID) | INTRAMUSCULAR | Status: DC | PRN
  Administered 2021-12-25 – 2021-12-27 (×3): 12.5 mg via INTRAVENOUS
  Filled 2021-12-25 (×3): qty 1

## 2021-12-25 MED ORDER — MORPHINE SULFATE (PF) 2 MG/ML IV SOLN
1.0000 mg | INTRAVENOUS | Status: DC | PRN
  Administered 2021-12-25 – 2021-12-29 (×3): 1 mg via INTRAVENOUS
  Filled 2021-12-25 (×3): qty 1

## 2021-12-25 MED ORDER — ONDANSETRON HCL 4 MG/2ML IJ SOLN: 4.0000 mg | Freq: Four times a day (QID) | INTRAMUSCULAR | Status: DC | PRN

## 2021-12-25 MED ORDER — MORPHINE SULFATE (PF) 2 MG/ML IV SOLN
2.0000 mg | Freq: Once | INTRAVENOUS | Status: AC
  Administered 2021-12-25: 2 mg via INTRAVENOUS
  Filled 2021-12-25: qty 1

## 2021-12-25 MED ORDER — CEFEPIME HCL 2 G IV SOLR
2.0000 g | Freq: Three times a day (TID) | INTRAVENOUS | Status: DC
  Administered 2021-12-25 – 2021-12-29 (×11): 2 g via INTRAVENOUS
  Filled 2021-12-25 (×11): qty 12.5

## 2021-12-25 MED ORDER — CHLORTHALIDONE 25 MG PO TABS
25.0000 mg | ORAL_TABLET | Freq: Every day | ORAL | Status: DC
Start: 2021-12-25 — End: 2021-12-29
  Administered 2021-12-26 – 2021-12-29 (×4): 25 mg via ORAL
  Filled 2021-12-25 (×5): qty 1

## 2021-12-25 MED ORDER — VANCOMYCIN HCL 1250 MG/250ML IV SOLN
1250.0000 mg | INTRAVENOUS | Status: DC
  Administered 2021-12-26 – 2021-12-28 (×3): 1250 mg via INTRAVENOUS
  Filled 2021-12-25 (×3): qty 250

## 2021-12-25 MED ORDER — POTASSIUM CHLORIDE CRYS ER 20 MEQ PO TBCR
40.0000 meq | EXTENDED_RELEASE_TABLET | Freq: Once | ORAL | Status: AC
  Administered 2021-12-25: 40 meq via ORAL
  Filled 2021-12-25: qty 2

## 2021-12-25 MED ORDER — ENOXAPARIN SODIUM 40 MG/0.4ML IJ SOSY
40.0000 mg | PREFILLED_SYRINGE | INTRAMUSCULAR | Status: DC
  Administered 2021-12-25 – 2021-12-28 (×4): 40 mg via SUBCUTANEOUS
  Filled 2021-12-25 (×4): qty 0.4

## 2021-12-25 NOTE — Assessment & Plan Note (Addendum)
Renal function is stable  Plan to add oral Kcl 40 meq  In the ED ordered 60 meq KCL.  Total Kcl to be given 100 meq.  Follow up renal function and electrolytes  Continue with chlorthalidone for now for blood pressure control.

## 2021-12-25 NOTE — Assessment & Plan Note (Signed)
No clinical signs of exacerbation  

## 2021-12-25 NOTE — ED Notes (Signed)
Only one set of blood culturer drawn after 3 attempts

## 2021-12-25 NOTE — H&P (Addendum)
History and Physical    Patient: [name] ULA:[id number] DOB: [birth year] DOA: 12/25/2021 DOS: the patient was seen and examined on 12/25/2021 PCP: Lujean Amel, MD  Patient coming from: Home  Chief Complaint:  Chief Complaint  Patient presents with   Post-op Problem   HPI: [name] is a 66 y.o. female with medical history significant of asthma, hepatitis C, hypertension, depression and obesity who presented with infected left ankle surgical wound.   Patient underwent  left achilles tendon repair with gasatrocnemius recession with Haglund's resection on 12/12/21 per Dr Posey Pronto from podiatry.  Patient had a follow up on 08/30. At that time she had persistent pain and erythema at the incision site. She had doxycyline prescribed and instructed to continue local wound care, along with oral analgesics.  Unfortunately her left ankle wound pain was persistent in nature, moderate to severe in intensity, worse with movement and associated with worsening wound erythema, drainage and edema. Her sister, a retired Marine scientist has been helping with wound care and she has been taking pictures from the wound. Over last several days the wound has been worsening.  She sent a picture to my chart to the podiatry office on 09/01 and Dr. Blenda Mounts added levofloxacin and instructed to come to the ED if no significant improvement.   Today her sister decided to bring patient to the ED due to persistent signs of wound infection.  Patient with no fever or chills, no nausea or vomiting, she has been continue taking her usual medications.   Review of Systems: As mentioned in the history of present illness. All other systems reviewed and are negative. Past Medical History:  Diagnosis Date   Asthma    Depression    Erosive esophagitis    Headaches, cluster    Hepatitis C    HTN (hypertension)    Past Surgical History:  Procedure Laterality Date   KNEE SURGERY  04/2020   Social History:  reports that she  has never smoked. She has never used smokeless tobacco. She reports that she does not currently use alcohol. No history on file for drug use.  Allergies  Allergen Reactions   Atenolol     FATIGUE/ HYPOTENSION   Lisinopril     COUGH/ WHEEZING   Paxil [Paroxetine Hcl]     INCREASED APPETITE FOR SWEETS   Zoloft [Sertraline Hcl]     TINNITUS   Elemental Sulfur Rash    FEVER    Family History  Problem Relation Age of Onset   Emphysema Father    Coronary artery disease Mother        CABG   Diabetes Mellitus II Mother    Heart disease Mother    Stroke Brother    Heart attack Sister    Crohn's disease Sister    Liver disease Sister    Heart disease Brother    Colitis Brother    Heart disease Brother    Irritable bowel syndrome Daughter    Bipolar disorder Daughter    Breast cancer Neg Hx     Prior to Admission medications   Medication Sig Start Date End Date Taking? Authorizing Provider  acetaminophen-codeine (TYLENOL #3) 300-30 MG tablet Take 1-2 tablets by mouth every 4 (four) hours as needed for moderate pain. 11/01/21  Yes Felipa Furnace, DPM  Calcium Carb-Cholecalciferol (CALCIUM-VITAMIN D3) 600-400 MG-UNIT TABS Take 1 tablet by mouth daily.   Yes [provider]  chlorthalidone (HYGROTON) 25 MG tablet Take 25 mg by mouth daily.  08/15/19  Yes [provider]  doxycycline (VIBRA-TABS) 100 MG tablet Take 1 tablet (100 mg total) by mouth 2 (two) times daily. 12/21/21  Yes Felipa Furnace, DPM  gabapentin (NEURONTIN) 100 MG capsule Take 1 capsule (100 mg total) by mouth 3 (three) times daily. 12/21/21  Yes Felipa Furnace, DPM  ibuprofen (ADVIL) 800 MG tablet Take 1 tablet (800 mg total) by mouth every 6 (six) hours as needed. 12/12/21  Yes Felipa Furnace, DPM  levofloxacin (LEVAQUIN) 500 MG tablet Take 1 tablet (500 mg total) by mouth daily for 7 days. 12/23/21 12/30/21 Yes Lorenda Peck, DPM  metoprolol tartrate (LOPRESSOR) 50 MG tablet TAKE ONE AND ONE-HALF TABLETS BY  MOUTH TWO TIMES A DAY Patient taking differently: Take 75 mg by mouth 2 (two) times daily. 07/07/21  Yes Josue Hector, MD  Multiple Vitamins-Minerals (MULTIVITAMIN PO) Take 1 tablet by mouth daily.   Yes [provider]  omeprazole (PRILOSEC) 40 MG capsule Take 40 mg by mouth daily. 10/14/15  Yes [provider]  oxyCODONE-acetaminophen (PERCOCET) 10-325 MG tablet Take 1 tablet by mouth every 4 (four) hours as needed for pain. 12/21/21  Yes Felipa Furnace, DPM  rivaroxaban (XARELTO) 10 MG TABS tablet Take 1 tablet (10 mg total) by mouth daily. 12/14/21  Yes Felipa Furnace, DPM  venlafaxine XR (EFFEXOR-XR) 37.5 MG 24 hr capsule Take 37.5 mg by mouth daily with breakfast.   Yes [provider]  acetaminophen-codeine (TYLENOL #3) 300-30 MG tablet Take 1-2 tablets by mouth every 4 (four) hours as needed for moderate pain. Patient not taking: Reported on 12/25/2021 12/06/21   Felipa Furnace, DPM  EPINEPHrine 0.3 mg/0.3 mL IJ SOAJ injection Inject 0.3 mg into the muscle as needed for anaphylaxis. 11/19/20   Lorelle Gibbs, DO    Physical Exam: Vitals:   12/25/21 1426 12/25/21 1535 12/25/21 1715  BP: (!) 146/89 (!) 144/76 135/86  Pulse: 86 83 84  Resp: '18 18 18  '$ Temp: 98.5 F (36.9 C)    TempSrc: Oral    SpO2: 97% 98% 99%   Neurology awake and alert ENT with no pallor Cardiovascular with S1 and S2 present and rhythmic with no gallops or murmurs Respiratory with no rales or wheezing  Abdomen protuberant but not tender Left lower extremity non pitting edema.  Left ankle with open wound at the achilles tendon.      Data Reviewed:   Left achilles tendo surgical wound infection.   Mrs. Spickard is a 66 yo female with past medical history for hypertension, hepatitis C and obesity who presented with infected surgical wound from a left achilles tendon repair. Surgical procedure took place 13 days ago and did not respond to outpatient antibiotic therapy with 4 of oral  doxycycline and 2 days of oral levofloxacin. On her initial physical examination she is hemodynamically stable, and afebrile. Her wound is open and has local erythema and drainage. Left leg edema, non pitting.   Na 142, K 2,7, Cl 99, bicarbonate 30, glucose 136, bun 29 cr 0,87  Lactic acid 2,7  Wbc 8,4 hgb 13,9 plt 338  Sed rate 23. Left ankle radiograph with post surgical changes but no acute abnormality.   EKG 86 bpm, normal axis, normal intervals, sinus rhythm with no significant ST segment or T wave changes.   Assessment and Plan: * Infected surgical wound Patient has failed outpatient antibiotic therapy with doxycycline and levofloxacin.  Plan to start patient on IV vancomycin and  IV cefepime Pain control with oral oxycodone and IV morphine as needed for moderate and severe pain. Celebrex bid and scheduled acetaminophen Continue with gabapentin.  [place] lower extremities rule out DVT. Follow up with podiatric surgery further recommendations.   Asthma, chronic No clinical signs of exacerbation.   Essential hypertension Continue blood pressure control with metoprolol and chlorthalidone.   Hypokalemia Renal function is stable  Plan to add oral Kcl 40 meq  In the ED ordered 60 meq KCL.  Total Kcl to be given 100 meq.  Follow up renal function and electrolytes  Continue with chlorthalidone for now for blood pressure control.   Depression Continue with venlafaxine   Obesity (BMI 30-39.9) Check BMI Patient will need Pt and Ot during her hospitalization, once her wound pain is better controlled.       Advance Care Planning:   Code Status: Prior full   Consults: Podiatry   Family Communication: I spoke with patient's sister at the bedside, we talked in detail about patient's condition, plan of care and prognosis and all questions were addressed.   Severity of Illness: The appropriate patient status for this patient is INPATIENT. Inpatient status is judged to be  reasonable and necessary in order to provide the required intensity of service to ensure the patient's safety. The patient's presenting symptoms, physical exam findings, and initial radiographic and laboratory data in the context of their chronic comorbidities is felt to place them at high risk for further clinical deterioration. Furthermore, it is not anticipated that the patient will be medically stable for discharge from the hospital within 2 midnights of admission.   * I certify that at the point of admission it is my clinical judgment that the patient will require inpatient hospital care spanning beyond 2 midnights from the point of admission due to high intensity of service, high risk for further deterioration and high frequency of surveillance required.*  Author: Tawni Millers, MD 12/25/2021 5:54 PM  For on call review [email or web address].

## 2021-12-25 NOTE — Assessment & Plan Note (Signed)
Continue blood pressure control with metoprolol and chlorthalidone.

## 2021-12-25 NOTE — ED Provider Notes (Signed)
[facility] DEPT Provider Note   CSN: [id number] Arrival date & time: 12/25/21  1422     History  Chief Complaint  Patient presents with   Post-op Problem    [name] is a 66 y.o. female with past medical history significant for recent repair for ruptured left Achilles with Dr. Boneta Lucks of podiatry who presents with concern for wound infection.  Patient reports that she started having some redness, purulent drainage, pain, was placed on doxycycline on Wednesday, called back on Friday due to no improvement, and placed on Levaquin.  She reports continued to worsen yesterday, possible slight improvement today, she denies any systemic fever, chills.  She has been changing the dressing as directed and has not missed any doses of antibiotics.  Was told to present to the emergency department by her podiatrist for further evaluation.  HPI     Home Medications Prior to Admission medications   Medication Sig Start Date End Date Taking? Authorizing Provider  acetaminophen-codeine (TYLENOL #3) 300-30 MG tablet Take 1-2 tablets by mouth every 4 (four) hours as needed for moderate pain. 11/01/21  Yes Felipa Furnace, DPM  Calcium Carb-Cholecalciferol (CALCIUM-VITAMIN D3) 600-400 MG-UNIT TABS Take 1 tablet by mouth daily.   Yes [provider]  chlorthalidone (HYGROTON) 25 MG tablet Take 25 mg by mouth daily. 08/15/19  Yes [provider]  doxycycline (VIBRA-TABS) 100 MG tablet Take 1 tablet (100 mg total) by mouth 2 (two) times daily. 12/21/21  Yes Felipa Furnace, DPM  gabapentin (NEURONTIN) 100 MG capsule Take 1 capsule (100 mg total) by mouth 3 (three) times daily. 12/21/21  Yes Felipa Furnace, DPM  ibuprofen (ADVIL) 800 MG tablet Take 1 tablet (800 mg total) by mouth every 6 (six) hours as needed. 12/12/21  Yes Felipa Furnace, DPM  levofloxacin (LEVAQUIN) 500 MG tablet Take 1 tablet (500 mg total) by mouth daily for 7 days. 12/23/21 12/30/21 Yes  Lorenda Peck, DPM  metoprolol tartrate (LOPRESSOR) 50 MG tablet TAKE ONE AND ONE-HALF TABLETS BY MOUTH TWO TIMES A DAY Patient taking differently: Take 75 mg by mouth 2 (two) times daily. 07/07/21  Yes Josue Hector, MD  Multiple Vitamins-Minerals (MULTIVITAMIN PO) Take 1 tablet by mouth daily.   Yes [provider]  omeprazole (PRILOSEC) 40 MG capsule Take 40 mg by mouth daily. 10/14/15  Yes [provider]  oxyCODONE-acetaminophen (PERCOCET) 10-325 MG tablet Take 1 tablet by mouth every 4 (four) hours as needed for pain. 12/21/21  Yes Felipa Furnace, DPM  rivaroxaban (XARELTO) 10 MG TABS tablet Take 1 tablet (10 mg total) by mouth daily. 12/14/21  Yes Felipa Furnace, DPM  venlafaxine XR (EFFEXOR-XR) 37.5 MG 24 hr capsule Take 37.5 mg by mouth daily with breakfast.   Yes [provider]  acetaminophen-codeine (TYLENOL #3) 300-30 MG tablet Take 1-2 tablets by mouth every 4 (four) hours as needed for moderate pain. Patient not taking: Reported on 12/25/2021 12/06/21   Felipa Furnace, DPM  EPINEPHrine 0.3 mg/0.3 mL IJ SOAJ injection Inject 0.3 mg into the muscle as needed for anaphylaxis. 11/19/20   Horton, Alvin Critchley, DO      Allergies    Atenolol, Lisinopril, Paxil [paroxetine hcl], Zoloft [sertraline hcl], and Elemental sulfur    Review of Systems   Review of Systems  Skin:  Positive for wound.  All other systems reviewed and are negative.   Physical Exam Updated Vital Signs BP 135/86   Pulse  84   Temp 98.5 F (36.9 C) (Oral)   Resp 18   SpO2 99%  Physical Exam Vitals and nursing note reviewed.  Constitutional:      General: She is not in acute distress.    Appearance: Normal appearance.  HENT:     Head: Normocephalic and atraumatic.  Eyes:     General:        Right eye: No discharge.        Left eye: No discharge.  Cardiovascular:     Rate and Rhythm: Normal rate and regular rhythm.     Heart sounds: No murmur heard.    No friction rub. No gallop.   Pulmonary:     Effort: Pulmonary effort is normal.     Breath sounds: Normal breath sounds.  Abdominal:     General: Bowel sounds are normal.     Palpations: Abdomen is soft.  Musculoskeletal:     Comments: She has slightly decreased range of motion to dorsiflexion, plantarflexion at the ankle, however low concern for septic joint without effusion or redness of the joint space  Skin:    General: Skin is warm and dry.     Capillary Refill: Capillary refill takes less than 2 seconds.  Neurological:     Mental Status: She is alert and oriented to person, place, and time.  Psychiatric:        Mood and Affect: Mood normal.        Behavior: Behavior normal.          ED Results / Procedures / Treatments   Labs (all labs ordered are listed, but only abnormal results are displayed) Labs Reviewed  BASIC METABOLIC PANEL - Abnormal; Notable for the following components:      Result Value   Potassium 2.7 (*)    Glucose, Bld 136 (*)    BUN 29 (*)    All other components within normal limits  SEDIMENTATION RATE - Abnormal; Notable for the following components:   Sed Rate 23 (*)    All other components within normal limits  LACTIC ACID, PLASMA - Abnormal; Notable for the following components:   Lactic Acid, Venous 2.7 (*)    All other components within normal limits  CULTURE, BLOOD (ROUTINE X 2)  CULTURE, BLOOD (ROUTINE X 2)  CBC  C-REACTIVE PROTEIN  LACTIC ACID, PLASMA    EKG None  Radiology DG Ankle Complete Left  Result Date: 12/25/2021 CLINICAL DATA:  Wound infection. EXAM: LEFT ANKLE COMPLETE - 3+ VIEW COMPARISON:  December 21, 2021. FINDINGS: Surgical staples are again noted posteriorly in the ankle and foot. Moderate posterior calcaneal spurring is noted. No fracture or dislocation is noted. No lytic destruction is noted to suggest osteomyelitis. IMPRESSION: Postsurgical changes as described above. No definite acute abnormality is noted. Electronically Signed   By: Marijo Conception M.D.   On: 12/25/2021 15:58    Procedures Procedures    Medications Ordered in ED Medications  potassium chloride 10 mEq in 100 mL IVPB (10 mEq Intravenous New Bag/Given 12/25/21 1738)  potassium chloride SA (KLOR-CON M) CR tablet 40 mEq (40 mEq Oral Given 12/25/21 1648)  sodium chloride 0.9 % bolus 1,000 mL (0 mLs Intravenous Stopped 12/25/21 1738)  cefTRIAXone (ROCEPHIN) 2 g in sodium chloride 0.9 % 100 mL IVPB (0 g Intravenous Stopped 12/25/21 1723)  morphine (PF) 2 MG/ML injection 2 mg (2 mg Intravenous Given 12/25/21 1743)  acetaminophen (TYLENOL) tablet 650 mg (650 mg Oral Given 12/25/21 1743)  ED Course/ Medical Decision Making/ A&P Clinical Course as of 12/25/21 1811  Nancy Fetter Dec 25, 2021  1721 Has had low normal potassium in past, now significantly hypokalemic. Does take clorthalidone. Will orally and IV replete. [CP]  0109 Dr. Blenda Mounts recommends admission [CP]    Clinical Course User Index [CP] Anselmo Pickler, PA-C                           Medical Decision Making Amount and/or Complexity of Data Reviewed Labs: ordered. Radiology: ordered.  Risk OTC drugs. Prescription drug management. Decision regarding hospitalization.   This patient is a 66 y.o. female who presents to the ED for concern of wound infection, questionable need for IV antibiotics, this involves an extensive number of treatment options, and is a complaint that carries with it a high risk of complications and morbidity. The emergent differential diagnosis prior to evaluation includes, but is not limited to,  wound infection, septic arthritis, systemic infection, sepsis, osteomyelitis.   This is not an exhaustive differential.   Past Medical History / Co-morbidities / Social History: HTN, asthma, depression, hep C  Additional history: Chart reviewed. Pertinent results include: reviewed surgical notes from recent Achilles Tendon repair surgery  Physical Exam: Physical exam performed. The  pertinent findings include: Patient does have questionably healing wound at the posterior left ankle and heel suspicious for further evaluation.  Clinically I think that it appears similar to slightly improved compared to the pictures from patient's friend from earlier this week, but some concern for worsening infection especially with a red, macerated appearance.  Lab Tests: I ordered, and personally interpreted labs.  The pertinent results include: CBC unremarkable, no signs of systemic, BMP notable for hypokalemia, potassium 2.7, likely secondary to patient's poor Tylenol use.  She does have a minimally elevated BUN as well.  Mild hyperglycemia glucose 136.  Sed rate is very minimally elevated at 23, she also has a lactic acid elevation of 2.7, represents some possible wound infection versus dehydration, patient does not have septic presentation and evaluation   Imaging Studies: I ordered imaging studies including plain film radiographs of the left ankle. I independently visualized and interpreted imaging which showed normal postoperative changes of the left ankle, no evidence of osteomyelitis. I agree with the radiologist interpretation.   Cardiac Monitoring:  The patient was maintained on a cardiac monitor.  My attending physician Dr. Zenia Resides viewed and interpreted the cardiac monitored which showed an underlying rhythm of: NSR. I agree with this interpretation.   Medications: I ordered medication including tylenol, morphine  for pain, bolus for lactic acid elevation, rocephin for wound infection, potassium for hypokalemia.  Will require reevaluation for response to treatment, she reports pain somewhat improved.   Consultations Obtained: I requested consultation with the podiatrist, spoke with Dr. Blenda Mounts as well as with Dr. Cathlean Sauer the hospitalist,  and discussed lab and imaging findings as well as pertinent plan - they recommend: Admission for IV antibiotics   Disposition: After consideration  of the diagnostic results and the patients response to treatment, I feel that she would benefit from mission as discussed above for wound infection and hypokalemia.   I discussed this case with my attending physician Dr. Zenia Resides who cosigned this note including patient's presenting symptoms, physical exam, and planned diagnostics and interventions. Attending physician stated agreement with plan or made changes to plan which were implemented.    Final Clinical Impression(s) / ED Diagnoses Final diagnoses:  None  Rx / DC Orders ED Discharge Orders     None         Dorien Chihuahua 12/25/21 1811    Lacretia Leigh, MD 12/26/21 1810

## 2021-12-25 NOTE — Progress Notes (Signed)
Pharmacy Antibiotic Note  [name] is a 66 y.o. female admitted on 12/25/2021 with  wound infection .  Pharmacy has been consulted for vanc/cefepime dosing.  Plan: Vanc 2g IV x 1 then '1250mg'$  IV q24 - goal AUC 400-550 Cefepime 2g IV q8     Temp (24hrs), Avg:98.4 F (36.9 C), Min:98.3 F (36.8 C), Max:98.5 F (36.9 C)  Recent Labs  Lab 12/25/21 1545 12/25/21 1547 12/25/21 1731  WBC 8.4  --   --   CREATININE 0.87  --   --   LATICACIDVEN  --  2.7* 2.2*    CrCl cannot be calculated (Unknown ideal weight.).    Allergies  Allergen Reactions   Atenolol     FATIGUE/ HYPOTENSION   Lisinopril     COUGH/ WHEEZING   Paxil [Paroxetine Hcl]     INCREASED APPETITE FOR SWEETS   Zoloft [Sertraline Hcl]     TINNITUS   Elemental Sulfur Rash    FEVER      Thank you for allowing pharmacy to be a part of this patient's care.  Kara Mead 12/25/2021 6:46 PM

## 2021-12-25 NOTE — Assessment & Plan Note (Addendum)
-   Patient has failed outpatient antibiotic therapy with doxycycline and levofloxacin - started on vanc and cefepime on admission - podiatry also consulted as well  - continue pain control - further wound management TBD; continue dressing for now - follow up cultures (negative to date) - LE duplex is negative for DVT bilaterally

## 2021-12-25 NOTE — Assessment & Plan Note (Signed)
Check BMI Patient will need Pt and Ot during her hospitalization, once her wound pain is better controlled.

## 2021-12-25 NOTE — ED Triage Notes (Signed)
Pt reports post op infection in leg. Pt was told to come to ED for IV antibiotics.

## 2021-12-25 NOTE — Assessment & Plan Note (Signed)
Continue with venlafaxine

## 2021-12-25 NOTE — ED Notes (Signed)
Critical Lab Value  Lactic 2.7 Potassium 2.7 Reported to C.Prosperi

## 2021-12-25 NOTE — Plan of Care (Signed)
  Problem: Education: Goal: Knowledge of General Education information will improve Description: Including pain rating scale, medication(s)/side effects and non-pharmacologic comfort measures Outcome: Progressing   Problem: Clinical Measurements: Goal: Ability to maintain clinical measurements within normal limits will improve Outcome: Progressing   Problem: Pain Managment: Goal: General experience of comfort will improve Outcome: Progressing   

## 2021-12-26 ENCOUNTER — Inpatient Hospital Stay (HOSPITAL_COMMUNITY): Payer: PPO

## 2021-12-26 DIAGNOSIS — M7989 Other specified soft tissue disorders: Secondary | ICD-10-CM | POA: Diagnosis not present

## 2021-12-26 DIAGNOSIS — L03119 Cellulitis of unspecified part of limb: Secondary | ICD-10-CM

## 2021-12-26 DIAGNOSIS — T8149XA Infection following a procedure, other surgical site, initial encounter: Secondary | ICD-10-CM | POA: Diagnosis not present

## 2021-12-26 LAB — CBC
HCT: 38.3 % (ref 36.0–46.0)
Hemoglobin: 12.6 g/dL (ref 12.0–15.0)
MCH: 29.3 pg (ref 26.0–34.0)
MCHC: 32.9 g/dL (ref 30.0–36.0)
MCV: 89.1 fL (ref 80.0–100.0)
Platelets: 287 10*3/uL (ref 150–400)
RBC: 4.3 MIL/uL (ref 3.87–5.11)
RDW: 13.6 % (ref 11.5–15.5)
WBC: 8.3 10*3/uL (ref 4.0–10.5)
nRBC: 0 % (ref 0.0–0.2)

## 2021-12-26 LAB — HEMOGLOBIN A1C
Hgb A1c MFr Bld: 5.7 % — ABNORMAL HIGH (ref 4.8–5.6)
Mean Plasma Glucose: 116.89 mg/dL

## 2021-12-26 LAB — BASIC METABOLIC PANEL
Anion gap: 10 (ref 5–15)
BUN: 21 mg/dL (ref 8–23)
CO2: 28 mmol/L (ref 22–32)
Calcium: 9.2 mg/dL (ref 8.9–10.3)
Chloride: 106 mmol/L (ref 98–111)
Creatinine, Ser: 0.7 mg/dL (ref 0.44–1.00)
GFR, Estimated: >60 mL/min (ref >60)
Glucose, Bld: 131 mg/dL — ABNORMAL HIGH (ref 70–99)
Potassium: 3 mmol/L — ABNORMAL LOW (ref 3.5–5.1)
Sodium: 144 mmol/L (ref 135–145)

## 2021-12-26 LAB — C-REACTIVE PROTEIN: CRP: 1.6 mg/dL — ABNORMAL HIGH (ref <1.0)

## 2021-12-26 LAB — HIV ANTIBODY (ROUTINE TESTING W REFLEX): HIV Screen 4th Generation wRfx: NONREACTIVE

## 2021-12-26 MED ORDER — OXYCODONE HCL 5 MG PO TABS
5.0000 mg | ORAL_TABLET | ORAL | Status: DC | PRN
  Administered 2021-12-26 – 2021-12-29 (×12): 10 mg via ORAL
  Filled 2021-12-26 (×12): qty 2

## 2021-12-26 MED ORDER — SODIUM CHLORIDE 0.9 % IV SOLN: INTRAVENOUS | Status: DC | PRN

## 2021-12-26 NOTE — Hospital Course (Addendum)
[name] is a 66 yo female with PMH asthma, HCV, HTN, depression, obesity who presented with a worsening left Achilles wound.  Patient underwent left achilles tendon repair with gasatrocnemius recession with Haglund's resection on 12/12/21 per Dr. Posey Pronto from podiatry.  Patient had a follow up on 08/30. At that time she had persistent pain and erythema at the incision site. She had doxycyline prescribed and instructed to continue local wound care, along with oral analgesics.  Unfortunately her left ankle wound pain was persistent in nature, moderate to severe in intensity, worse with movement and associated with worsening wound erythema, drainage and edema. She was transitioned to Levaquin after wound continued to worsen.  Due to still nonimprovement, she presented to the ER for further evaluation.

## 2021-12-26 NOTE — Consult Note (Signed)
Reason for Consult:Cellulitis, wound dehiscence Referring Physician: Dr. Dwyane Dee, MD  [name] is an 66 y.o. female.  HPI: 66 year old female with past medical history significant for asthma, hepatitis C, hypertension, depression and obesity presented to the hospital with worsening infection of her left foot surgical wound.  She recently underwent Achilles tendon rupture repair, gastrocnemius recession with Dr. Boneta Lucks on December 12, 2021.  At the last visit on August 30 there is noted to be some erythema and was started on doxycycline.  Patient called the office on December 23, 2021 stating that there is no improvement with antibiotics and they been changing the dressing.  Levaquin was then apparently added.  Due to worsening of the infection recommended in the emergency room for evaluation.   Past Medical History:  Diagnosis Date   Asthma    Asthma    Depression    Depression    Depression    Erosive esophagitis    Headaches, cluster    Hepatitis C    HTN (hypertension)    HTN (hypertension)    HTN (hypertension)    Hypokalemia 05/29/2020    Past Surgical History:  Procedure Laterality Date   KNEE SURGERY  04/2020    Family History  Problem Relation Age of Onset   Emphysema Father    Coronary artery disease Mother        CABG   Diabetes Mellitus II Mother    Heart disease Mother    Stroke Brother    Heart attack Sister    Crohn's disease Sister    Liver disease Sister    Heart disease Brother    Colitis Brother    Heart disease Brother    Irritable bowel syndrome Daughter    Bipolar disorder Daughter    Breast cancer Neg Hx     Social History:  reports that she has never smoked. She has never used smokeless tobacco. She reports that she does not currently use alcohol. No history on file for drug use.  Allergies:  Allergies  Allergen Reactions   Atenolol     FATIGUE/ HYPOTENSION   Lisinopril     COUGH/ WHEEZING   Paxil [Paroxetine Hcl]      INCREASED APPETITE FOR SWEETS   Zoloft [Sertraline Hcl]     TINNITUS   Elemental Sulfur Rash    FEVER    Medications: I have reviewed the patient's current medications.  Results for orders placed or performed during the hospital encounter of 12/25/21 (from the past 48 hour(s))  CBC     Status: None   Collection Time: 12/25/21  3:45 PM  Result Value Ref Range   WBC 8.4 4.0 - 10.5 K/uL   RBC 4.73 3.87 - 5.11 MIL/uL   Hemoglobin 13.9 12.0 - 15.0 g/dL   HCT 41.4 36.0 - 46.0 %   MCV 87.5 80.0 - 100.0 fL   MCH 29.4 26.0 - 34.0 pg   MCHC 33.6 30.0 - 36.0 g/dL   RDW 13.3 11.5 - 15.5 %   Platelets 338 150 - 400 K/uL   nRBC 0.0 0.0 - 0.2 %    Comment: Performed at [facility], [place] [address] [place], [place] [address]  Basic metabolic panel     Status: Abnormal   Collection Time: 12/25/21  3:45 PM  Result Value Ref Range   Sodium 142 135 - 145 mmol/L   Potassium 2.7 (LL) 3.5 - 5.1 mmol/L    Comment: CRITICAL RESULT CALLED TO, READ  BACK BY AND VERIFIED WITH SAVOIE,B. RN AT 1631 12/25/21 MULLINS,T    Chloride 99 98 - 111 mmol/L   CO2 30 22 - 32 mmol/L   Glucose, Bld 136 (H) 70 - 99 mg/dL    Comment: Glucose reference range applies only to samples taken after fasting for at least 8 hours.   BUN 29 (H) 8 - 23 mg/dL   Creatinine, Ser 0.87 0.44 - 1.00 mg/dL   Calcium 9.5 8.9 - 10.3 mg/dL   GFR, Estimated >60 >60 mL/min    Comment: (NOTE) Calculated using the CKD-EPI Creatinine Equation (2021)    Anion gap 13 5 - 15    Comment: Performed at [facility], [place] [address], [place], [place] [address]  Sedimentation rate     Status: Abnormal   Collection Time: 12/25/21  3:45 PM  Result Value Ref Range   Sed Rate 23 (H) 0 - 22 mm/hr    Comment: Performed at [facility], [place] [address], [place], [place] [address]  Lactic acid, plasma     Status: Abnormal   Collection Time: 12/25/21  3:47 PM  Result Value Ref Range   Lactic Acid,  Venous 2.7 (HH) 0.5 - 1.9 mmol/L    Comment: CRITICAL RESULT CALLED TO, READ BACK BY AND VERIFIED WITH SAVOIE,B. RN AT 0102 12/25/21 MULLINS,T Performed at [facility] [place] [address], [place], [place] [address]   Blood culture (routine x 2)     Status: None (Preliminary result)   Collection Time: 12/25/21  4:10 PM   Specimen: BLOOD  Result Value Ref Range   Specimen Description      BLOOD LEFT ANTECUBITAL Performed at [facility], [place] [address] [place], [place] [address]    Special Requests      BOTTLES DRAWN AEROBIC AND ANAEROBIC Blood Culture results may not be optimal due to an excessive volume of blood received in culture bottles Performed at [place] [address] [place], [place] [address]    Culture      NO GROWTH < 24 HOURS Performed at [place] [address] [place], [place] [address]    Report Status PENDING   Lactic acid, plasma     Status: Abnormal   Collection Time: 12/25/21  5:31 PM  Result Value Ref Range   Lactic Acid, Venous 2.2 (HH) 0.5 - 1.9 mmol/L    Comment: CRITICAL VALUE NOTED. VALUE IS CONSISTENT WITH PREVIOUSLY REPORTED/CALLED VALUE Performed at [place] [address], [place], [place] [address]   C-reactive protein     Status: Abnormal   Collection Time: 12/25/21  7:49 PM  Result Value Ref Range   CRP 1.6 (H) <1.0 mg/dL    Comment: Performed at [place] [address], [place], [place] [address]  Blood culture (routine x 2)     Status: None (Preliminary result)   Collection Time: 12/25/21  7:49 PM   Specimen: BLOOD  Result Value Ref Range   Specimen Description      BLOOD RIGHT ANTECUBITAL Performed at [place] [address] [place], [place] [address]    Special Requests      BOTTLES DRAWN AEROBIC ONLY Blood Culture results may not be optimal due to an inadequate volume of blood received in culture  bottles Performed at [place] [address] [place], [place] [address]    Culture  NO GROWTH < 12 HOURS Performed at [place] [address], [place], [place] [address]    Report Status PENDING   HIV Antibody (routine testing w rflx)     Status: None   Collection Time: 12/25/21  7:49 PM  Result Value Ref Range   HIV Screen 4th Generation wRfx Non Reactive Non Reactive    Comment: Performed at [facility], [place] [address], [place], [place] [address]  CBC     Status: None   Collection Time: 12/25/21  7:49 PM  Result Value Ref Range   WBC 9.3 4.0 - 10.5 K/uL   RBC 4.51 3.87 - 5.11 MIL/uL   Hemoglobin 13.2 12.0 - 15.0 g/dL   HCT 39.7 36.0 - 46.0 %   MCV 88.0 80.0 - 100.0 fL   MCH 29.3 26.0 - 34.0 pg   MCHC 33.2 30.0 - 36.0 g/dL   RDW 13.5 11.5 - 15.5 %   Platelets 300 150 - 400 K/uL   nRBC 0.0 0.0 - 0.2 %    Comment: Performed at [facility], [place] [address], [place], [place] [address]  Creatinine, serum     Status: None   Collection Time: 12/25/21  7:49 PM  Result Value Ref Range   Creatinine, Ser 0.75 0.44 - 1.00 mg/dL   GFR, Estimated >60 >60 mL/min    Comment: (NOTE) Calculated using the CKD-EPI Creatinine Equation (2021) Performed at [facility], [place] [address], [place], [place] [address]   Basic metabolic panel     Status: Abnormal   Collection Time: 12/26/21  3:21 AM  Result Value Ref Range   Sodium 144 135 - 145 mmol/L   Potassium 3.0 (L) 3.5 - 5.1 mmol/L   Chloride 106 98 - 111 mmol/L   CO2 28 22 - 32 mmol/L   Glucose, Bld 131 (H) 70 - 99 mg/dL    Comment: Glucose reference range applies only to samples taken after fasting for at least 8 hours.   BUN 21 8 - 23 mg/dL   Creatinine, Ser 0.70 0.44 - 1.00 mg/dL   Calcium 9.2 8.9 - 10.3 mg/dL   GFR, Estimated >60 >60 mL/min    Comment: (NOTE) Calculated using the CKD-EPI Creatinine Equation (2021)    Anion gap 10 5 - 15     Comment: Performed at [facility], [place] [address], [place], [place] [address]  CBC     Status: None   Collection Time: 12/26/21  3:21 AM  Result Value Ref Range   WBC 8.3 4.0 - 10.5 K/uL   RBC 4.30 3.87 - 5.11 MIL/uL   Hemoglobin 12.6 12.0 - 15.0 g/dL   HCT 38.3 36.0 - 46.0 %   MCV 89.1 80.0 - 100.0 fL   MCH 29.3 26.0 - 34.0 pg   MCHC 32.9 30.0 - 36.0 g/dL   RDW 13.6 11.5 - 15.5 %   Platelets 287 150 - 400 K/uL   nRBC 0.0 0.0 - 0.2 %    Comment: Performed at [facility], [place] [address], [place], [place] [address]    DG Ankle Complete Left  Result Date: 12/25/2021 CLINICAL DATA:  Wound infection. EXAM: LEFT ANKLE COMPLETE - 3+ VIEW COMPARISON:  December 21, 2021. FINDINGS: Surgical staples are again noted posteriorly in the ankle and foot. Moderate posterior calcaneal spurring is noted. No fracture or dislocation is noted. No lytic destruction is noted to suggest osteomyelitis. IMPRESSION: Postsurgical changes as described  above. No definite acute abnormality is noted. Electronically Signed   By: Marijo Conception M.D.   On: 12/25/2021 15:58    Review of Systems Blood pressure 131/82, pulse 79, temperature 97.6 F (36.4 C), resp. rate 16, height '5\' 4"'$  (1.626 m), weight 102.9 kg, SpO2 100 %. Physical Exam General: AAO x3, NAD  Dermatological: Gastrocnemius recession site incision intact with staples but there is localized edema erythema.  Distally along the area of the Achilles tendon repair site there is dehiscence of the wound centrally with a granular base but there is no exposed tendon or bone.  There is still edema and erythema around the wound but appears to be improved compared to the pictures.  There is no frank purulence noted.  There is no fluctuation or crepitation.          Vascular: Dorsalis Pedis artery and Posterior Tibial artery pedal pulses are palpable bilateral with immedate capillary fill time. There is no pain with calf  compression, swelling, warmth, erythema.   Neruologic: Grossly intact via light touch bilateral.   Musculoskeletal: Mild discomfort on exam.     Assessment/Plan:  -X-rays reviewed. -White blood cell count normal 8.3 today.  CRP very minimally elevated at 1.6 as well as sed rate 23. -A1c pending -Blood cultures pending -Dressing change today appears more motivated is applied followed by dressing.  Encouraged elevation -Nonweightbearing -Continue broad-spectrum antibiotics for now.  I clinically are not able to appreciate any abscess.  We will hold off on MRI for further imaging at this time however if there is clinical concern we will get MRI. -Also will have the ortho tech apply a posterior splint instead of the CAM boot to see if that will help decrease pressure to make sure is not causing any irritation. -Venous duplex pending at the time of evaluation -Podiatry will continue to follow  Trula Slade 12/26/2021, 11:39 AM

## 2021-12-26 NOTE — Progress Notes (Signed)
Orthopedic Tech Progress Note Patient Details:  [name] 04/20/1956 [id number]  Patient ID: [name], female   DOB: 1955/05/22, 66 y.o.   MRN: [id number] Spoke with Doctor Jacqualyn Posey, and we will keep the cam boot for now instead of doing a short leg splint due to the patients infection and the possibility of continuous wound care. [name] 12/26/2021, 4:20 PM

## 2021-12-26 NOTE — Progress Notes (Signed)
VASCULAR LAB    Bilateral lower extremity venous duplex has been performed.  See CV proc for preliminary results.   Joann Jorge, RVT 12/26/2021, 12:15 PM

## 2021-12-26 NOTE — Progress Notes (Signed)
Progress Note    [name]   HKV:[id number]  DOB: 1956/02/29  DOA: 12/25/2021     1 PCP: [name] Amel, MD  Initial CC: left achilles wound  Hospital Course: [name] is a 66 yo female with PMH asthma, HCV, HTN, depression, obesity who presented with a worsening left Achilles wound.  Patient underwent left achilles tendon repair with gasatrocnemius recession with Haglund's resection on 12/12/21 per Dr. Posey [name] from podiatry.  Patient had a follow up on 08/30. At that time she had persistent pain and erythema at the incision site. She had doxycyline prescribed and instructed to continue local wound care, along with oral analgesics.  Unfortunately her left ankle wound pain was persistent in nature, moderate to severe in intensity, worse with movement and associated with worsening wound erythema, drainage and edema. She was transitioned to Levaquin after wound continued to worsen.  Due to still nonimprovement, she presented to the ER for further evaluation.    Interval History:  No events overnight.  Resting in bed comfortably when seen.  Main concern was wound care for the left Achilles.  Still has some ongoing soreness and tenderness.  She remains nonweightbearing to the left lower extremity. Also spoke with her daughter on the phone while in the room for an update today.  Assessment and Plan: * Infected surgical wound - Patient has failed outpatient antibiotic therapy with doxycycline and levofloxacin - started on vanc and cefepime on admission - podiatry also consulted as well  - continue pain control - further wound management TBD - follow up cultures - follow up LE duplex   Asthma, chronic No clinical signs of exacerbation.   Essential hypertension Continue blood pressure control with metoprolol and chlorthalidone.  Hypokalemia - replete as needed  Depression Continue with venlafaxine   Obesity (BMI 30-39.9) - will resume PT/OT once able  - Body mass index is  38.93 kg/m.    Old records reviewed in assessment of this patient  Antimicrobials: Rocephin 12/25/2021 x 1  Vancomycin 12/25/2021 >> current Cefepime 12/25/2021 >> current  DVT prophylaxis:  enoxaparin (LOVENOX) injection 40 mg Start: 12/25/21 2200 SCDs Start: 12/25/21 1939   Code Status:   Code Status: Full Code  Mobility Assessment (last 72 hours)     Mobility Assessment     Row Name 12/26/21 1000 12/25/21 1944         Does patient have an order for bedrest or is patient medically unstable No - Continue assessment No - Continue assessment      What is the highest level of mobility based on the progressive mobility assessment? Level 4 (Walks with assist in room) - Balance while marching in place and cannot step forward and back - Complete Level 4 (Walks with assist in room) - Balance while marching in place and cannot step forward and back - Complete               Barriers to discharge:  Disposition Plan:  Home 1-2 days Status is: Inpt  Objective: Blood pressure 131/82, pulse 79, temperature 97.6 F (36.4 C), resp. rate 16, height '5\' 4"'$  (1.626 m), weight 102.9 kg, SpO2 100 %.  Examination:  Physical Exam Constitutional:      Appearance: Normal appearance.  HENT:     Head: Normocephalic and atraumatic.     Mouth/Throat:     Mouth: Mucous membranes are moist.  Eyes:     Extraocular Movements: Extraocular movements intact.  Cardiovascular:     Rate and Rhythm:  Normal rate and regular rhythm.  Pulmonary:     Effort: Pulmonary effort is normal.     Breath sounds: Normal breath sounds.  Abdominal:     General: Bowel sounds are normal. There is no distension.     Palpations: Abdomen is soft.     Tenderness: There is no abdominal tenderness.  Musculoskeletal:     Cervical back: Normal range of motion and neck supple.     Comments: Left achilles wound noted with milky appearance with loose staples in place and partial sloughing of skin around wound bed; calf staples  in place with darkened skin appearance but no tenderness nor true erythema  Neurological:     General: No focal deficit present.     Mental Status: She is alert.  Psychiatric:        Mood and Affect: Mood normal.   Pic taken 12/26/21:    Consultants:  Podiatry  Procedures:    Data Reviewed: Results for orders placed or performed during the hospital encounter of 12/25/21 (from the past 24 hour(s))  CBC     Status: None   Collection Time: 12/25/21  3:45 PM  Result Value Ref Range   WBC 8.4 4.0 - 10.5 K/uL   RBC 4.73 3.87 - 5.11 MIL/uL   Hemoglobin 13.9 12.0 - 15.0 g/dL   HCT 41.4 36.0 - 46.0 %   MCV 87.5 80.0 - 100.0 fL   MCH 29.4 26.0 - 34.0 pg   MCHC 33.6 30.0 - 36.0 g/dL   RDW 13.3 11.5 - 15.5 %   Platelets 338 150 - 400 K/uL   nRBC 0.0 0.0 - 0.2 %  Basic metabolic panel     Status: Abnormal   Collection Time: 12/25/21  3:45 PM  Result Value Ref Range   Sodium 142 135 - 145 mmol/L   Potassium 2.7 (LL) 3.5 - 5.1 mmol/L   Chloride 99 98 - 111 mmol/L   CO2 30 22 - 32 mmol/L   Glucose, Bld 136 (H) 70 - 99 mg/dL   BUN 29 (H) 8 - 23 mg/dL   Creatinine, Ser 0.87 0.44 - 1.00 mg/dL   Calcium 9.5 8.9 - 10.3 mg/dL   GFR, Estimated >60 >60 mL/min   Anion gap 13 5 - 15  Sedimentation rate     Status: Abnormal   Collection Time: 12/25/21  3:45 PM  Result Value Ref Range   Sed Rate 23 (H) 0 - 22 mm/hr  Lactic acid, plasma     Status: Abnormal   Collection Time: 12/25/21  3:47 PM  Result Value Ref Range   Lactic Acid, Venous 2.7 (HH) 0.5 - 1.9 mmol/L  Blood culture (routine x 2)     Status: None (Preliminary result)   Collection Time: 12/25/21  4:10 PM   Specimen: BLOOD  Result Value Ref Range   Specimen Description      BLOOD LEFT ANTECUBITAL Performed at [facility], [place] [address] [place], [place] [address]    Special Requests      BOTTLES DRAWN AEROBIC AND ANAEROBIC Blood Culture results may not be optimal due to an excessive volume of blood  received in culture bottles Performed at [place] [address], [place], [place] [address]    Culture      NO GROWTH < 24 HOURS Performed at [facility] [address] [place], [place] [address]    Report Status PENDING   Lactic acid, plasma  Status: Abnormal   Collection Time: 12/25/21  5:31 PM  Result Value Ref Range   Lactic Acid, Venous 2.2 (HH) 0.5 - 1.9 mmol/L  C-reactive protein     Status: Abnormal   Collection Time: 12/25/21  7:49 PM  Result Value Ref Range   CRP 1.6 (H) <1.0 mg/dL  Blood culture (routine x 2)     Status: None (Preliminary result)   Collection Time: 12/25/21  7:49 PM   Specimen: BLOOD  Result Value Ref Range   Specimen Description      BLOOD RIGHT ANTECUBITAL Performed at [facility], [place] [address] [place], [place] [address]    Special Requests      BOTTLES DRAWN AEROBIC ONLY Blood Culture results may not be optimal due to an inadequate volume of blood received in culture bottles Performed at [place] [address], [place], [place] [address]    Culture      NO GROWTH < 12 HOURS Performed at [facility] [place] [address], [place],  [address]    Report Status PENDING   HIV Antibody (routine testing w rflx)     Status: None   Collection Time: 12/25/21  7:49 PM  Result Value Ref Range   HIV Screen 4th Generation wRfx Non Reactive Non Reactive  CBC     Status: None   Collection Time: 12/25/21  7:49 PM  Result Value Ref Range   WBC 9.3 4.0 - 10.5 K/uL   RBC 4.51 3.87 - 5.11 MIL/uL   Hemoglobin 13.2 12.0 - 15.0 g/dL   HCT 39.7 36.0 - 46.0 %   MCV 88.0 80.0 - 100.0 fL   MCH 29.3 26.0 - 34.0 pg   MCHC 33.2 30.0 - 36.0 g/dL   RDW 13.5 11.5 - 15.5 %   Platelets 300 150 - 400 K/uL   nRBC 0.0 0.0 - 0.2 %  Creatinine, serum     Status: None   Collection Time: 12/25/21  7:49 PM  Result Value Ref Range   Creatinine, Ser 0.75 0.44 - 1.00 mg/dL   GFR,  Estimated >60 >60 mL/min  Basic metabolic panel     Status: Abnormal   Collection Time: 12/26/21  3:21 AM  Result Value Ref Range   Sodium 144 135 - 145 mmol/L   Potassium 3.0 (L) 3.5 - 5.1 mmol/L   Chloride 106 98 - 111 mmol/L   CO2 28 22 - 32 mmol/L   Glucose, Bld 131 (H) 70 - 99 mg/dL   BUN 21 8 - 23 mg/dL   Creatinine, Ser 0.70 0.44 - 1.00 mg/dL   Calcium 9.2 8.9 - 10.3 mg/dL   GFR, Estimated >60 >60 mL/min   Anion gap 10 5 - 15  CBC     Status: None   Collection Time: 12/26/21  3:21 AM  Result Value Ref Range   WBC 8.3 4.0 - 10.5 K/uL   RBC 4.30 3.87 - 5.11 MIL/uL   Hemoglobin 12.6 12.0 - 15.0 g/dL   HCT 38.3 36.0 - 46.0 %   MCV 89.1 80.0 - 100.0 fL   MCH 29.3 26.0 - 34.0 pg   MCHC 32.9 30.0 - 36.0 g/dL   RDW 13.6 11.5 - 15.5 %   Platelets 287 150 - 400 K/uL   nRBC 0.0 0.0 - 0.2 %    I have Reviewed nursing notes, Vitals, and Lab results since pt's last encounter. Pertinent lab results : see above I have ordered test  including BMP, CBC, Mg I have reviewed the last note from staff over past 24 hours I have discussed pt's care plan and test results with nursing staff, case manager   LOS: 1 day   Dwyane Dee, MD [facility] 12/26/2021, 12:20 PM

## 2021-12-27 ENCOUNTER — Inpatient Hospital Stay (HOSPITAL_COMMUNITY): Payer: PPO

## 2021-12-27 DIAGNOSIS — T8149XA Infection following a procedure, other surgical site, initial encounter: Secondary | ICD-10-CM | POA: Diagnosis not present

## 2021-12-27 DIAGNOSIS — R7303 Prediabetes: Secondary | ICD-10-CM

## 2021-12-27 DIAGNOSIS — I739 Peripheral vascular disease, unspecified: Secondary | ICD-10-CM | POA: Diagnosis not present

## 2021-12-27 DIAGNOSIS — L03119 Cellulitis of unspecified part of limb: Secondary | ICD-10-CM

## 2021-12-27 LAB — BASIC METABOLIC PANEL
Anion gap: 10 (ref 5–15)
BUN: 20 mg/dL (ref 8–23)
CO2: 28 mmol/L (ref 22–32)
Calcium: 9.4 mg/dL (ref 8.9–10.3)
Chloride: 104 mmol/L (ref 98–111)
Creatinine, Ser: 0.6 mg/dL (ref 0.44–1.00)
GFR, Estimated: >60 mL/min (ref >60)
Glucose, Bld: 112 mg/dL — ABNORMAL HIGH (ref 70–99)
Potassium: 2.9 mmol/L — ABNORMAL LOW (ref 3.5–5.1)
Sodium: 142 mmol/L (ref 135–145)

## 2021-12-27 LAB — CBC WITH DIFFERENTIAL/PLATELET
Abs Immature Granulocytes: 0.03 10*3/uL (ref 0.00–0.07)
Basophils Absolute: 0 10*3/uL (ref 0.0–0.1)
Basophils Relative: 1 %
Eosinophils Absolute: 0.3 10*3/uL (ref 0.0–0.5)
Eosinophils Relative: 4 %
HCT: 38.7 % (ref 36.0–46.0)
Hemoglobin: 12.9 g/dL (ref 12.0–15.0)
Immature Granulocytes: 0 %
Lymphocytes Relative: 47 %
Lymphs Abs: 3.1 10*3/uL (ref 0.7–4.0)
MCH: 29.7 pg (ref 26.0–34.0)
MCHC: 33.3 g/dL (ref 30.0–36.0)
MCV: 89.2 fL (ref 80.0–100.0)
Monocytes Absolute: 0.6 10*3/uL (ref 0.1–1.0)
Monocytes Relative: 9 %
Neutro Abs: 2.6 10*3/uL (ref 1.7–7.7)
Neutrophils Relative %: 39 %
Platelets: 267 10*3/uL (ref 150–400)
RBC: 4.34 MIL/uL (ref 3.87–5.11)
RDW: 13.5 % (ref 11.5–15.5)
WBC: 6.7 10*3/uL (ref 4.0–10.5)
nRBC: 0 % (ref 0.0–0.2)

## 2021-12-27 LAB — MAGNESIUM: Magnesium: 1.8 mg/dL (ref 1.7–2.4)

## 2021-12-27 MED ORDER — POTASSIUM CHLORIDE CRYS ER 20 MEQ PO TBCR
40.0000 meq | EXTENDED_RELEASE_TABLET | Freq: Once | ORAL | Status: AC
  Administered 2021-12-27: 40 meq via ORAL
  Filled 2021-12-27: qty 2

## 2021-12-27 NOTE — Plan of Care (Signed)
  Problem: Activity: Goal: Risk for activity intolerance will decrease Outcome: Progressing   Problem: Pain Managment: Goal: General experience of comfort will improve Outcome: Progressing   Problem: Safety: Goal: Ability to remain free from injury will improve Outcome: Progressing   

## 2021-12-27 NOTE — Progress Notes (Signed)
ABI's have been completed. Preliminary results can be found in CV Proc through chart review.   12/27/21 2:33 PM [name] RVT

## 2021-12-27 NOTE — Progress Notes (Signed)
Progress Note    [name]   XBM:[id number]  DOB: [birth year]  DOA: 12/25/2021     2 PCP: [name] Amel, MD  Initial CC: left achilles wound  Hospital Course: [name] is a 66 yo female with PMH asthma, HCV, HTN, depression, obesity who presented with a worsening left Achilles wound.  Patient underwent left achilles tendon repair with gasatrocnemius recession with Haglund's resection on 12/12/21 per Dr. Posey Pronto from podiatry.  Patient had a follow up on 08/30. At that time she had persistent pain and erythema at the incision site. She had doxycyline prescribed and instructed to continue local wound care, along with oral analgesics.  Unfortunately her left ankle wound pain was persistent in nature, moderate to severe in intensity, worse with movement and associated with worsening wound erythema, drainage and edema. She was transitioned to Levaquin after wound continued to worsen.  Due to still nonimprovement, she presented to the ER for further evaluation.  Interval History:  No events overnight, though she did have some worsening pain flare which was treated. Spoke with daughter on phone today in room.  Plan is to continue abx for now and continue boot. The wound may simply need more time to heal at this point.   Assessment and Plan: * Infected surgical wound - Patient has failed outpatient antibiotic therapy with doxycycline and levofloxacin - started on vanc and cefepime on admission - podiatry also consulted as well  - continue pain control - further wound management TBD; continue dressing for now - follow up cultures (negative to date) - LE duplex is negative for DVT bilaterally  Asthma, chronic No clinical signs of exacerbation.   Essential hypertension Continue blood pressure control with metoprolol and chlorthalidone.  Hypokalemia - replete as needed  Depression Continue with venlafaxine   Obesity (BMI 30-39.9) - will resume PT/OT once able  - Body mass  index is 38.93 kg/m.  Prediabetes - A1c 5.7% - discussed with patient and daughter; ideally have recommended that patient target weight loss, but given mobility status this will be difficult for a while to do much physical activity; next best option recommended was trying to change diet some. I reassured that A1c is not concerning but their hope is to get her out of "prediabetic" range which they understand she is right on the border as is    Old records reviewed in assessment of this patient  Antimicrobials: Rocephin 12/25/2021 x 1  Vancomycin 12/25/2021 >> current Cefepime 12/25/2021 >> current  DVT prophylaxis:  enoxaparin (LOVENOX) injection 40 mg Start: 12/25/21 2200 SCDs Start: 12/25/21 1939   Code Status:   Code Status: Full Code  Mobility Assessment (last 72 hours)     Mobility Assessment     Row Name 12/27/21 0726 12/26/21 2117 12/26/21 1000 12/25/21 1944     Does patient have an order for bedrest or is patient medically unstable No - Continue assessment No - Continue assessment No - Continue assessment No - Continue assessment    What is the highest level of mobility based on the progressive mobility assessment? Level 4 (Walks with assist in room) - Balance while marching in place and cannot step forward and back - Complete Level 4 (Walks with assist in room) - Balance while marching in place and cannot step forward and back - Complete Level 4 (Walks with assist in room) - Balance while marching in place and cannot step forward and back - Complete Level 4 (Walks with assist in room) - Balance while  marching in place and cannot step forward and back - Complete             Barriers to discharge:  Disposition Plan:  Home 1-2 days Status is: Inpt  Objective: Blood pressure (!) 153/93, pulse 77, temperature 98.1 F (36.7 C), temperature source Oral, resp. rate 16, height '5\' 4"'$  (1.626 m), weight 102.9 kg, SpO2 99 %.  Examination:  Physical Exam Constitutional:       Appearance: Normal appearance.  HENT:     Head: Normocephalic and atraumatic.     Mouth/Throat:     Mouth: Mucous membranes are moist.  Eyes:     Extraocular Movements: Extraocular movements intact.  Cardiovascular:     Rate and Rhythm: Normal rate and regular rhythm.  Pulmonary:     Effort: Pulmonary effort is normal.     Breath sounds: Normal breath sounds.  Abdominal:     General: Bowel sounds are normal. There is no distension.     Palpations: Abdomen is soft.     Tenderness: There is no abdominal tenderness.  Musculoskeletal:     Cervical back: Normal range of motion and neck supple.     Comments: Left achilles wound noted with milky appearance with loose staples in place and partial sloughing of skin around wound bed which is partially dehisced in middle; calf staples in place with darkened skin appearance but no tenderness, drainage, nor true erythema  Neurological:     General: No focal deficit present.     Mental Status: She is alert.  Psychiatric:        Mood and Affect: Mood normal.   Pic taken 12/26/21:    Consultants:  Podiatry  Procedures:    Data Reviewed: Results for orders placed or performed during the hospital encounter of 12/25/21 (from the past 24 hour(s))  Basic metabolic panel     Status: Abnormal   Collection Time: 12/27/21  3:23 AM  Result Value Ref Range   Sodium 142 135 - 145 mmol/L   Potassium 2.9 (L) 3.5 - 5.1 mmol/L   Chloride 104 98 - 111 mmol/L   CO2 28 22 - 32 mmol/L   Glucose, Bld 112 (H) 70 - 99 mg/dL   BUN 20 8 - 23 mg/dL   Creatinine, Ser 0.60 0.44 - 1.00 mg/dL   Calcium 9.4 8.9 - 10.3 mg/dL   GFR, Estimated >60 >60 mL/min   Anion gap 10 5 - 15  CBC with Differential/Platelet     Status: None   Collection Time: 12/27/21  3:23 AM  Result Value Ref Range   WBC 6.7 4.0 - 10.5 K/uL   RBC 4.34 3.87 - 5.11 MIL/uL   Hemoglobin 12.9 12.0 - 15.0 g/dL   HCT 38.7 36.0 - 46.0 %   MCV 89.2 80.0 - 100.0 fL   MCH 29.7 26.0 - 34.0 pg    MCHC 33.3 30.0 - 36.0 g/dL   RDW 13.5 11.5 - 15.5 %   Platelets 267 150 - 400 K/uL   nRBC 0.0 0.0 - 0.2 %   Neutrophils Relative % 39 %   Neutro Abs 2.6 1.7 - 7.7 K/uL   Lymphocytes Relative 47 %   Lymphs Abs 3.1 0.7 - 4.0 K/uL   Monocytes Relative 9 %   Monocytes Absolute 0.6 0.1 - 1.0 K/uL   Eosinophils Relative 4 %   Eosinophils Absolute 0.3 0.0 - 0.5 K/uL   Basophils Relative 1 %   Basophils Absolute 0.0 0.0 - 0.1 K/uL  Immature Granulocytes 0 %   Abs Immature Granulocytes 0.03 0.00 - 0.07 K/uL  Magnesium     Status: None   Collection Time: 12/27/21  3:23 AM  Result Value Ref Range   Magnesium 1.8 1.7 - 2.4 mg/dL    I have Reviewed nursing notes, Vitals, and Lab results since pt's last encounter. Pertinent lab results : see above I have ordered test including BMP, CBC, Mg I have reviewed the last note from staff over past 24 hours I have discussed pt's care plan and test results with nursing staff, case manager   LOS: 2 days   Dwyane Dee, MD [facility] 12/27/2021, 2:11 PM

## 2021-12-27 NOTE — Assessment & Plan Note (Signed)
-   A1c 5.7% - discussed with patient and daughter; ideally have recommended that patient target weight loss, but given mobility status this will be difficult for a while to do much physical activity; next best option recommended was trying to change diet some. I reassured that A1c is not concerning but their hope is to get her out of "prediabetic" range which they understand she is right on the border as is

## 2021-12-27 NOTE — Progress Notes (Signed)
PODIATRY PROGRESS NOTE  NAME [name] MRN [id number] DOB [birth year] DOA 12/25/2021   Reason for consult:  Chief Complaint  Patient presents with   Post-op Problem     History of present illness: 66 y.o. female admitted to the hospital for wound infection, dehiscence status post Achilles tendon rupture repair and gastrocnemius recession with Dr. Boneta Lucks.  Still having pain but denies any fevers or chills.  Family is at bedside today.  Vitals:   12/26/21 2128 12/27/21 0541  BP: (!) 143/91 (!) 153/93  Pulse: 76 77  Resp: 16 16  Temp: 97.9 F (36.6 C) 98.1 F (36.7 C)  SpO2: 99% 99%       Latest Ref Rng & Units 12/27/2021    3:23 AM 12/26/2021    3:21 AM 12/25/2021    7:49 PM  CBC  WBC 4.0 - 10.5 K/uL 6.7  8.3  9.3   Hemoglobin 12.0 - 15.0 g/dL 12.9  12.6  13.2   Hematocrit 36.0 - 46.0 % 38.7  38.3  39.7   Platelets 150 - 400 K/uL 267  287  300        Latest Ref Rng & Units 12/27/2021    3:23 AM 12/26/2021    3:21 AM 12/25/2021    7:49 PM  BMP  Glucose 70 - 99 mg/dL 112  131    BUN 8 - 23 mg/dL 20  21    Creatinine 0.44 - 1.00 mg/dL 0.60  0.70  0.75   Sodium 135 - 145 mmol/L 142  144    Potassium 3.5 - 5.1 mmol/L 2.9  3.0    Chloride 98 - 111 mmol/L 104  106    CO2 22 - 32 mmol/L 28  28    Calcium 8.9 - 10.3 mg/dL 9.4  9.2        Physical Exam: General: AOx3, NAD  Dermatology: Wound dehiscence noted along the distal portion along the Achilles tendon repair site.  The wound does not probe down to the Achilles tendon but did appear to be somewhat deeper today.  There is still some localized edema and erythema.  The cellulitis appears to be improving.  There is no fluctuance or crepitation but there is no moderate.  Vascular: DP, PT pulses palpable  Neurological: Sensation intact  Musculoskeletal Exam: Pain to the surgical site    ASSESSMENT/PLAN OF CARE  Wound dehiscence, cellulitis left side status post Achilles tendon repair.  Dressing was changed  today.  Clean the wound with saline and saline wet-to-dry was applied.  The wound is somewhat deeper today and will order arterial studies to ensure adequate circulation.  I also ordered MRI.  Later in the day I did receive a message from the tech stating that the radiologist advised against the MRI due to the artifact from the staples.  I do not feel comfortable removing the staples at this time given her recent surgery.  I tried contacting the radiology department and was not able to reach an [facility] radiologist.  Discussed with the radiologist at [facility] possible ultrasound but I will we discussed with radiology in the morning about further imaging.  For now continue nonweightbearing and would recommend wearing the cam boot at all times.  She states that she slept without the boot last night but I would wear the boot for immobilization given her recent surgery.  Continue current antibiotics including vancomycin, cefepime.  Podiatry will continue to follow.     Please contact me  directly with any questions or concerns.     Celesta Gentile, DPM [facility]  [address] Bonna Gains. [address], [address], [place] [address]                Office [phone]  Fax [phone]

## 2021-12-27 NOTE — Progress Notes (Signed)
Transition of Care [facility]) Screening Note  Patient Details  Name: [name] Date of Birth: 01/15/1956  Transition of Care [facility]) CM/SW Contact:    Sherie Don, LCSW Phone Number: 12/27/2021, 9:53 AM  Transition of Care Department [facility]) has reviewed patient and no TOC needs have been identified at this time. We will continue to monitor patient advancement through interdisciplinary progression rounds. If new patient transition needs arise, please place a TOC consult.

## 2021-12-28 ENCOUNTER — Inpatient Hospital Stay (HOSPITAL_COMMUNITY): Payer: PPO

## 2021-12-28 DIAGNOSIS — L03119 Cellulitis of unspecified part of limb: Secondary | ICD-10-CM

## 2021-12-28 DIAGNOSIS — T8149XA Infection following a procedure, other surgical site, initial encounter: Secondary | ICD-10-CM | POA: Diagnosis not present

## 2021-12-28 LAB — BASIC METABOLIC PANEL
Anion gap: 10 (ref 5–15)
BUN: 19 mg/dL (ref 8–23)
CO2: 29 mmol/L (ref 22–32)
Calcium: 9.8 mg/dL (ref 8.9–10.3)
Chloride: 107 mmol/L (ref 98–111)
Creatinine, Ser: 0.73 mg/dL (ref 0.44–1.00)
GFR, Estimated: >60 mL/min (ref >60)
Glucose, Bld: 113 mg/dL — ABNORMAL HIGH (ref 70–99)
Potassium: 3.1 mmol/L — ABNORMAL LOW (ref 3.5–5.1)
Sodium: 146 mmol/L — ABNORMAL HIGH (ref 135–145)

## 2021-12-28 LAB — CBC WITH DIFFERENTIAL/PLATELET
Abs Immature Granulocytes: 0.04 10*3/uL (ref 0.00–0.07)
Basophils Absolute: 0 10*3/uL (ref 0.0–0.1)
Basophils Relative: 1 %
Eosinophils Absolute: 0.3 10*3/uL (ref 0.0–0.5)
Eosinophils Relative: 4 %
HCT: 40.5 % (ref 36.0–46.0)
Hemoglobin: 13.3 g/dL (ref 12.0–15.0)
Immature Granulocytes: 1 %
Lymphocytes Relative: 41 %
Lymphs Abs: 3.2 10*3/uL (ref 0.7–4.0)
MCH: 29.3 pg (ref 26.0–34.0)
MCHC: 32.8 g/dL (ref 30.0–36.0)
MCV: 89.2 fL (ref 80.0–100.0)
Monocytes Absolute: 0.7 10*3/uL (ref 0.1–1.0)
Monocytes Relative: 10 %
Neutro Abs: 3.2 10*3/uL (ref 1.7–7.7)
Neutrophils Relative %: 43 %
Platelets: 290 10*3/uL (ref 150–400)
RBC: 4.54 MIL/uL (ref 3.87–5.11)
RDW: 13.6 % (ref 11.5–15.5)
WBC: 7.4 10*3/uL (ref 4.0–10.5)
nRBC: 0 % (ref 0.0–0.2)

## 2021-12-28 LAB — MAGNESIUM: Magnesium: 1.9 mg/dL (ref 1.7–2.4)

## 2021-12-28 LAB — C-REACTIVE PROTEIN: CRP: 0.7 mg/dL (ref <1.0)

## 2021-12-28 LAB — SEDIMENTATION RATE: Sed Rate: 17 mm/hr (ref 0–22)

## 2021-12-28 MED ORDER — POTASSIUM CHLORIDE CRYS ER 20 MEQ PO TBCR
40.0000 meq | EXTENDED_RELEASE_TABLET | Freq: Once | ORAL | Status: AC
  Administered 2021-12-28: 40 meq via ORAL
  Filled 2021-12-28: qty 2

## 2021-12-28 MED ORDER — DIPHENHYDRAMINE HCL 12.5 MG/5ML PO ELIX
12.5000 mg | ORAL_SOLUTION | Freq: Four times a day (QID) | ORAL | Status: DC | PRN
  Administered 2021-12-28 – 2021-12-29 (×2): 12.5 mg via ORAL
  Filled 2021-12-28 (×2): qty 5

## 2021-12-28 MED ORDER — IOHEXOL 300 MG/ML  SOLN
100.0000 mL | Freq: Once | INTRAMUSCULAR | Status: AC | PRN
  Administered 2021-12-28: 100 mL via INTRAVENOUS

## 2021-12-28 NOTE — Plan of Care (Signed)
Pt alert and oriented x 4. Pt received 2 doses of oxy this shift. 1 dose of benadryl and 1 dose of morphine. New iv placed by IV team. Cefepime currently infusing. [place] in place. Leg elevated. Pt requested to use purewick during the overnight due to possible incontinence with benadryl  Problem: Education: Goal: Knowledge of General Education information will improve Description: Including pain rating scale, medication(s)/side effects and non-pharmacologic comfort measures Outcome: Progressing   Problem: Health Behavior/Discharge Planning: Goal: Ability to manage health-related needs will improve Outcome: Progressing   Problem: Clinical Measurements: Goal: Ability to maintain clinical measurements within normal limits will improve Outcome: Progressing Goal: Will remain free from infection Outcome: Progressing Goal: Diagnostic test results will improve Outcome: Progressing Goal: Respiratory complications will improve Outcome: Progressing Goal: Cardiovascular complication will be avoided Outcome: Progressing   Problem: Activity: Goal: Risk for activity intolerance will decrease Outcome: Progressing   Problem: Nutrition: Goal: Adequate nutrition will be maintained Outcome: Progressing   Problem: Coping: Goal: Level of anxiety will decrease Outcome: Progressing   Problem: Elimination: Goal: Will not experience complications related to bowel motility Outcome: Progressing Goal: Will not experience complications related to urinary retention Outcome: Progressing   Problem: Pain Managment: Goal: General experience of comfort will improve Outcome: Progressing   Problem: Safety: Goal: Ability to remain free from injury will improve Outcome: Progressing   Problem: Skin Integrity: Goal: Risk for impaired skin integrity will decrease Outcome: Progressing

## 2021-12-28 NOTE — Progress Notes (Signed)
PROGRESS NOTE  [name]  GQQ:[id number] DOB: Oct 27, 1955 DOA: 12/25/2021 PCP: Lujean Amel, MD   Brief Narrative: Patient is a 66 year old female with history of asthma, hepatitis C , hypertension, depression, obesity who presented with complaints of worsening left Achilles wound.  She recently underwent left Achilles tendon repair with gastrocnemius recession with Haglund's resection on 12/12/2021 by podiatry.  Postop, she had persistent pain, erythema at incision site and was treated with doxycycline.  Her left ankle wound pain became persistent, pain was worse with movement, development of wound erythema, drainage , edema despite being transitioned to Levaquin.  Podiatry following here.  Planning for MRI of the left foot.  Currently on broad spectrum antibiotics.   Assessment & Plan:  Principal Problem:   Infected surgical wound Active Problems:   Asthma, chronic   Essential hypertension   Hypokalemia   Depression   Obesity (BMI 30-39.9)   Prediabetes  Infected surgical wound: Information as above.  Failed multiple treatment with antibiotics.  Continue pain management, current IV antibiotics.  Podiatry following.  Plan for MRI.  Left lower extremity duplex negative for DVT.  Follow-up cultures, no growth till date.  Asthma: No signs of exacerbation  Hypertension: Currently blood pressure stable.  On metoprolol, chlorthalidone  Hypokalemia: Continue monitoring and supplement as needed.Magnesium is normal  Depression: On naloxone  Prediabetes: Hemoglobin A1c 5.7  Obesity: BMI of 38.9         DVT prophylaxis:enoxaparin (LOVENOX) injection 40 mg Start: 12/25/21 2200 SCDs Start: 12/25/21 1939     Code Status: Full Code  Family Communication:None at bedside   Patient status:Inpatient  Patient is from :Home  Anticipated discharge IZ:TIWP  Estimated DC date:1-2 days   Consultants: Podiatry  Procedures:None  Antimicrobials:  Anti-infectives (From  admission, onward)    Start     Dose/Rate Route Frequency Ordered Stop   12/26/21 2000  vancomycin (VANCOREADY) IVPB 1250 mg/250 mL        1,250 mg 166.7 mL/hr over 90 Minutes Intravenous Every 24 hours 12/25/21 1847     12/25/21 2200  ceFEPIme (MAXIPIME) 2 g in sodium chloride 0.9 % 100 mL IVPB        2 g 200 mL/hr over 30 Minutes Intravenous Every 8 hours 12/25/21 1847     12/25/21 1900  vancomycin (VANCOREADY) IVPB 2000 mg/400 mL        2,000 mg 200 mL/hr over 120 Minutes Intravenous  Once 12/25/21 1847 12/25/21 2204   12/25/21 1700  cefTRIAXone (ROCEPHIN) 2 g in sodium chloride 0.9 % 100 mL IVPB        2 g 200 mL/hr over 30 Minutes Intravenous  Once 12/25/21 1646 12/25/21 1723       Subjective: Patient seen and examined at bedside this morning.  Hemodynamically stable.  Lying on bed.  Left lower extremity in brace.  Denies any worsening pain or discomfort.  Objective: Vitals:   12/26/21 2128 12/27/21 0541 12/27/21 2102 12/28/21 0540  BP: (!) 143/91 (!) 153/93 (!) 157/80 (!) 147/82  Pulse: 76 77 93 79  Resp: '16 16 17 17  '$ Temp: 97.9 F (36.6 C) 98.1 F (36.7 C) 98 F (36.7 C) 98 F (36.7 C)  TempSrc: Oral Oral Oral Oral  SpO2: 99% 99% 95% 98%  Weight:      Height:        Intake/Output Summary (Last 24 hours) at 12/28/2021 0952 Last data filed at 12/28/2021 0534 Gross per 24 hour  Intake --  Output 700 ml  Net -700 ml   Filed Weights   12/25/21 1934  Weight: 102.9 kg    Examination:  General exam: Overall comfortable, not in distress, obese HEENT: PERRL Respiratory system:  no wheezes or crackles  Cardiovascular system: S1 & S2 heard, RRR.  Gastrointestinal system: Abdomen is nondistended, soft and nontender. Central nervous system: Alert and oriented Extremities: No edema, no clubbing ,no cyanosis, left lower extremity brace Skin: No rashes, no ulcers,no icterus     Data Reviewed: I have personally reviewed following labs and imaging  studies  CBC: Recent Labs  Lab 12/25/21 1545 12/25/21 1949 12/26/21 0321 12/27/21 0323 12/28/21 0330  WBC 8.4 9.3 8.3 6.7 7.4  NEUTROABS  --   --   --  2.6 3.2  HGB 13.9 13.2 12.6 12.9 13.3  HCT 41.4 39.7 38.3 38.7 40.5  MCV 87.5 88.0 89.1 89.2 89.2  PLT 338 300 287 267 161   Basic Metabolic Panel: Recent Labs  Lab 12/25/21 1545 12/25/21 1949 12/26/21 0321 12/27/21 0323 12/28/21 0330  NA 142  --  144 142 146*  K 2.7*  --  3.0* 2.9* 3.1*  CL 99  --  106 104 107  CO2 30  --  '28 28 29  '$ GLUCOSE 136*  --  131* 112* 113*  BUN 29*  --  '21 20 19  '$ CREATININE 0.87 0.75 0.70 0.60 0.73  CALCIUM 9.5  --  9.2 9.4 9.8  MG  --   --   --  1.8 1.9     Recent Results (from the past 240 hour(s))  Blood culture (routine x 2)     Status: None (Preliminary result)   Collection Time: 12/25/21  4:10 PM   Specimen: BLOOD  Result Value Ref Range Status   Specimen Description   Final    BLOOD LEFT ANTECUBITAL Performed at [facility], [place] [address], [place], [place] [address]    Special Requests   Final    BOTTLES DRAWN AEROBIC AND ANAEROBIC Blood Culture results may not be optimal due to an excessive volume of blood received in culture bottles Performed at [facility] [address], [place], [place] [address]    Culture   Final    NO GROWTH 3 DAYS Performed at [facility], [place] [address], [place], [place] [address]    Report Status PENDING  Incomplete  Blood culture (routine x 2)     Status: None (Preliminary result)   Collection Time: 12/25/21  7:49 PM   Specimen: BLOOD  Result Value Ref Range Status   Specimen Description   Final    BLOOD RIGHT ANTECUBITAL Performed at [facility] [address], [place], [place] [address]    Special Requests   Final    BOTTLES DRAWN AEROBIC ONLY Blood Culture results may not be optimal due to an inadequate volume of blood received in culture bottles Performed at [facility] [address] [place], [place] [address]    Culture   Final    NO GROWTH 3 DAYS Performed at [facility], [place] [address], [place], [place] [address]    Report Status PENDING  Incomplete     Radiology Studies: VAS [place] ABI WITH/WO TBI  Result Date: 12/27/2021  LOWER EXTREMITY DOPPLER STUDY Patient Name:  [name]  Date of Exam:   12/27/2021 Medical Rec #: [id number]         Accession #:  [id number] Date of Birth: 03/22/1956         Patient Gender: F Patient Age:   16 years Exam Location:  [facility] Procedure:      VAS [place] ABI WITH/WO TBI Referring Phys: Celesta Gentile --------------------------------------------------------------------------------  Indications: Peripheral artery disease. High Risk Factors: Hypertension.  Comparison Study: No prior studies. Performing Technologist: Carlos Levering RVT  Examination Guidelines: A complete evaluation includes at minimum, Doppler waveform signals and systolic blood pressure reading at the level of bilateral brachial, anterior tibial, and posterior tibial arteries, when vessel segments are accessible. Bilateral testing is considered an integral part of a complete examination. Photoelectric Plethysmograph (PPG) waveforms and toe systolic pressure readings are included as required and additional duplex testing as needed. Limited examinations for reoccurring indications may be performed as noted.  ABI Findings: +--------+------------------+-----+-----------+--------+ Right   Rt Pressure (mmHg)IndexWaveform   Comment  +--------+------------------+-----+-----------+--------+ [id number]                    triphasic           +--------+------------------+-----+-----------+--------+ PTA     147               1.04 triphasic           +--------+------------------+-----+-----------+--------+ DP      131               0.92 multiphasic         +--------+------------------+-----+-----------+--------+  +--------+------------------+-----+---------+-------+ Left    Lt Pressure (mmHg)IndexWaveform Comment +--------+------------------+-----+---------+-------+ [id number]                    triphasic        +--------+------------------+-----+---------+-------+ PTA     157               1.11 triphasic        +--------+------------------+-----+---------+-------+ DP      146               1.03 triphasic        +--------+------------------+-----+---------+-------+ +-------+-----------+-----------+------------+------------+ ABI/TBIToday's ABIToday's TBIPrevious ABIPrevious TBI +-------+-----------+-----------+------------+------------+ Right  1.04                                           +-------+-----------+-----------+------------+------------+ Left   1.11                                           +-------+-----------+-----------+------------+------------+  Summary: Right: Resting right ankle-brachial index is within normal range. Left: Resting left ankle-brachial index is within normal range. *See table(s) above for measurements and observations.  Electronically signed by Deitra Mayo MD on 12/27/2021 at 5:58:25 PM.    Final    VAS [place] LOWER EXTREMITY VENOUS (DVT)  Addendum Date: 12/26/2021   There is no DVT noted in the visualized portions of the left posterior tibial and peroneal veins. Sharion Dove RVS Electronically Amended 12/26/2021, 7:10 PM   Final Loreli Dollar)    Addendum Date: 12/26/2021   There is no DVT noted in the visualized portions of the left posterior tibial and peroneal veins. Sharion Dove RVS Electronically Amended 12/26/2021, 7:10 PM   Final Loreli Dollar)    Addendum Date: 12/26/2021   There is no DVT noted in the visualized portions of the left posterior tibial and peroneal veins.  Sharion Dove RVS Electronically Amended 12/26/2021, 7:10 PM   Final Loreli Dollar)    Result Date: 12/26/2021  Lower Venous DVT Study Patient Name:  [name]  Date of Exam:    12/26/2021 Medical Rec #: [id number]         Accession #:    [id number] Date of Birth: 23-May-1955         Patient Gender: F Patient Age:   26 years Exam Location:  [facility] Procedure:      VAS [place] LOWER EXTREMITY VENOUS (DVT) Referring Phys: Sander Radon --------------------------------------------------------------------------------  Indications: Swelling, and Infection status post Achilles tendon repair.  Limitations: Bandages and orthopaedic appliance. Comparison Study: Prior negative Bilateral LEV done 05/30/20 Performing Technologist: Sharion Dove RVS  Examination Guidelines: A complete evaluation includes B-mode imaging, spectral Doppler, color Doppler, and power Doppler as needed of all accessible portions of each vessel. Bilateral testing is considered an integral part of a complete examination. Limited examinations for reoccurring indications may be performed as noted. The reflux portion of the exam is performed with the patient in reverse Trendelenburg.  +---------+---------------+---------+-----------+----------+--------------+ RIGHT    CompressibilityPhasicitySpontaneityPropertiesThrombus Aging +---------+---------------+---------+-----------+----------+--------------+ CFV      Full           Yes      Yes                                 +---------+---------------+---------+-----------+----------+--------------+ SFJ      Full                                                        +---------+---------------+---------+-----------+----------+--------------+ FV Prox  Full                                                        +---------+---------------+---------+-----------+----------+--------------+ FV Mid   Full                                                        +---------+---------------+---------+-----------+----------+--------------+ FV DistalFull                                                         +---------+---------------+---------+-----------+----------+--------------+ PFV      Full                                                        +---------+---------------+---------+-----------+----------+--------------+ POP      Full           Yes      Yes                                 +---------+---------------+---------+-----------+----------+--------------+  PTV      Full                                                        +---------+---------------+---------+-----------+----------+--------------+ PERO     Full                                                        +---------+---------------+---------+-----------+----------+--------------+   +---------+---------------+---------+-----------+----------+--------------+ LEFT     CompressibilityPhasicitySpontaneityPropertiesThrombus Aging +---------+---------------+---------+-----------+----------+--------------+ CFV      Full           Yes      Yes                                 +---------+---------------+---------+-----------+----------+--------------+ SFJ      Full                                                        +---------+---------------+---------+-----------+----------+--------------+ FV Prox  Full                                                        +---------+---------------+---------+-----------+----------+--------------+ FV Mid   Full                                                        +---------+---------------+---------+-----------+----------+--------------+ FV DistalFull                                                        +---------+---------------+---------+-----------+----------+--------------+ PFV      Full                                                        +---------+---------------+---------+-----------+----------+--------------+ POP      Full           Yes      Yes                                  +---------+---------------+---------+-----------+----------+--------------+ PTV      Full                                                        +---------+---------------+---------+-----------+----------+--------------+  PERO     Full                                                        +---------+---------------+---------+-----------+----------+--------------+ Gastroc  Full                                                        +---------+---------------+---------+-----------+----------+--------------+ Posterior tibial and peroneal veins appear DVT to mid calf. New Bandages and boot in place from lower mid calf down.    Summary: RIGHT: - There is no evidence of deep vein thrombosis in the lower extremity.  LEFT: - There is no evidence of deep vein thrombosis in the lower extremity. However, portions of this examination were limited- see technologist comments above.  - No cystic structure found in the popliteal fossa.  *See table(s) above for measurements and observations. Electronically signed by [name] Martinez MD on 12/26/2021 at 1:31:07 PM.    Final (Amended)     Scheduled Meds:  acetaminophen  650 mg Oral Q6H   calcium-vitamin D  1 tablet Oral Daily   celecoxib  200 mg Oral BID   chlorthalidone  25 mg Oral Daily   enoxaparin (LOVENOX) injection  40 mg Subcutaneous Q24H   gabapentin  100 mg Oral TID   metoprolol tartrate  75 mg Oral BID   multivitamin  15 mL Oral Daily   pantoprazole  40 mg Oral Daily   venlafaxine XR  37.5 mg Oral Q breakfast   Continuous Infusions:  sodium chloride     ceFEPime (MAXIPIME) IV 2 g (12/28/21 0534)   vancomycin 1,250 mg (12/27/21 1949)     LOS: 3 days   Shelly Coss, MD [facility] P9/09/2021, 9:52 AM

## 2021-12-28 NOTE — Plan of Care (Signed)
  Problem: Activity: Goal: Risk for activity intolerance will decrease Outcome: Progressing   Problem: Pain Managment: Goal: General experience of comfort will improve Outcome: Progressing   

## 2021-12-28 NOTE — Progress Notes (Addendum)
PODIATRY PROGRESS NOTE  NAME [name] MRN [id number] DOB October 22, 1955 DOA 12/25/2021   Reason for consult:  Chief Complaint  Patient presents with   Post-op Problem     History of present illness: 66 y.o. female admitted to the hospital for wound infection, dehiscence status post Achilles tendon rupture repair and gastrocnemius recession with Dr. Nicholes Rough.  Still having some pain.  Denies any fevers or chills.  Has questions about her potassium as well as her other medications  Vitals:   12/26/21 2128 12/27/21 0541  BP: (!) 143/91 (!) 153/93  Pulse: 76 77  Resp: 16 16  Temp: 97.9 F (36.6 C) 98.1 F (36.7 C)  SpO2: 99% 99%       Latest Ref Rng & Units 12/27/2021    3:23 AM 12/26/2021    3:21 AM 12/25/2021    7:49 PM  CBC  WBC 4.0 - 10.5 K/uL 6.7  8.3  9.3   Hemoglobin 12.0 - 15.0 g/dL 14.6  27.4  02.5   Hematocrit 36.0 - 46.0 % 38.7  38.3  39.7   Platelets 150 - 400 K/uL 267  287  300        Latest Ref Rng & Units 12/27/2021    3:23 AM 12/26/2021    3:21 AM 12/25/2021    7:49 PM  BMP  Glucose 70 - 99 mg/dL 367  245    BUN 8 - 23 mg/dL 20  21    Creatinine 8.00 - 1.00 mg/dL 4.50  0.40  6.21   Sodium 135 - 145 mmol/L 142  144    Potassium 3.5 - 5.1 mmol/L 2.9  3.0    Chloride 98 - 111 mmol/L 104  106    CO2 22 - 32 mmol/L 28  28    Calcium 8.9 - 10.3 mg/dL 9.4  9.2        Physical Exam: General: AOx3, NAD  Dermatology: See pictures below.  Gastrocnemius resection site with staples intact with some scabbing and some localized edema erythema to this area.  Distally on the area of Achilles tendon repair there is a wound present along the distal portion which is full-thickness but not able to x-ray the Achilles tendon.  There is macerated tissue along the incision.  There is still some localized erythema but there is no ascending cellulitis.  There is no purulence noted.       Vascular: DP, PT pulses palpable  Neurological: Sensation  intact  Musculoskeletal Exam: Pain to the surgical site    ASSESSMENT/PLAN OF CARE  Wound dehiscence, cellulitis left side status post Achilles tendon repair.  -Recheck CRP, ESR -I spoke with radiology, no able to do the MRI. CT w contrast ordered to help rule out any deeper infection, hematoma, and the integrity of the Achilles tendon.  -Continue NWB -Ice/elevation  -Again discussed with Dr. Allena Katz.  He also contacted the patient today. -I have not obtained a wound culture as there is no drainage and I feel this would be a superficial culture.  -Saline wet to dry applied to the wound. Will likely go back to betadine wet to dry dressing tomorrow.  -Podiatry will continue to follow  Dispo: pending CT scan at the time of evaluation.  If no clinical signs of abscess or fluid collection likely will discharge home with oral antibiotics, local wound care and follow-up in the office with Dr. Allena Katz.  I discussed this case with several members of our group as well.  Please contact me directly with any questions or concerns.     Celesta Gentile, DPM [facility]  [address], [place] [address] [place], [place] [address]                Office [phone]  Fax [phone]

## 2021-12-28 NOTE — Progress Notes (Signed)
Pharmacy Antibiotic Note  [name] is a 66 y.o. female admitted on 12/25/2021 with  wound infection .  Pharmacy has been consulted for vanc/cefepime dosing. 12/28/2021 Day #4 vancomycin & cefepime WBC WNL, Afebrile, SCr WNL  Plan: Continue Vancomycin  '1250mg'$  IV q24 - goal AUC 400-550 Continue Cefepime 2g IV q8 F/u for transition to oral antibiotics  Height: '5\' 4"'$  (162.6 cm) Weight: 102.9 kg (226 lb 12.8 oz) IBW/kg (Calculated) : 54.7  Temp (24hrs), Avg:98 F (36.7 C), Min:98 F (36.7 C), Max:98 F (36.7 C)  Recent Labs  Lab 12/25/21 1545 12/25/21 1547 12/25/21 1731 12/25/21 1949 12/26/21 0321 12/27/21 0323 12/28/21 0330  WBC 8.4  --   --  9.3 8.3 6.7 7.4  CREATININE 0.87  --   --  0.75 0.70 0.60 0.73  LATICACIDVEN  --  2.7* 2.2*  --   --   --   --      Estimated Creatinine Clearance: 80.8 mL/min (by C-G formula based on SCr of 0.73 mg/dL).    Allergies  Allergen Reactions   Atenolol     FATIGUE/ HYPOTENSION   Lisinopril     COUGH/ WHEEZING   Paxil [Paroxetine Hcl]     INCREASED APPETITE FOR SWEETS   Zoloft [Sertraline Hcl]     TINNITUS   Elemental Sulfur Rash    FEVER    Antimicrobials this admission:  9/3 cefepime>> 9/3 vanc>> Dose adjustments this admission:   Microbiology results:  9/3 BCx2 : ngtd   Thank you for allowing pharmacy to be a part of this patient's care.   Eudelia Bunch, Pharm.D 12/28/2021 8:01 AM

## 2021-12-29 DIAGNOSIS — T8149XA Infection following a procedure, other surgical site, initial encounter: Secondary | ICD-10-CM | POA: Diagnosis not present

## 2021-12-29 LAB — BASIC METABOLIC PANEL
Anion gap: 11 (ref 5–15)
BUN: 17 mg/dL (ref 8–23)
CO2: 29 mmol/L (ref 22–32)
Calcium: 9.6 mg/dL (ref 8.9–10.3)
Chloride: 105 mmol/L (ref 98–111)
Creatinine, Ser: 0.8 mg/dL (ref 0.44–1.00)
GFR, Estimated: >60 mL/min (ref >60)
Glucose, Bld: 105 mg/dL — ABNORMAL HIGH (ref 70–99)
Potassium: 3.3 mmol/L — ABNORMAL LOW (ref 3.5–5.1)
Sodium: 145 mmol/L (ref 135–145)

## 2021-12-29 MED ORDER — DOXYCYCLINE HYCLATE 100 MG PO TABS: 100.0000 mg | ORAL_TABLET | Freq: Two times a day (BID) | ORAL | 0 refills | Status: AC

## 2021-12-29 MED ORDER — METOPROLOL TARTRATE 75 MG PO TABS: 75.0000 mg | ORAL_TABLET | Freq: Two times a day (BID) | ORAL | 0 refills | Status: AC

## 2021-12-29 MED ORDER — POTASSIUM CHLORIDE CRYS ER 20 MEQ PO TBCR: 40.0000 meq | EXTENDED_RELEASE_TABLET | Freq: Every day | ORAL | 0 refills | Status: DC

## 2021-12-29 MED ORDER — AMLODIPINE BESYLATE 5 MG PO TABS: 5.0000 mg | ORAL_TABLET | Freq: Every day | ORAL | Status: DC

## 2021-12-29 MED ORDER — AMLODIPINE BESYLATE 10 MG PO TABS
10.0000 mg | ORAL_TABLET | Freq: Every day | ORAL | Status: DC
  Administered 2021-12-29: 10 mg via ORAL
  Filled 2021-12-29: qty 1

## 2021-12-29 MED ORDER — AMOXICILLIN-POT CLAVULANATE 875-125 MG PO TABS
1.0000 | ORAL_TABLET | Freq: Two times a day (BID) | ORAL | Status: DC
  Administered 2021-12-29: 1 via ORAL
  Filled 2021-12-29: qty 1

## 2021-12-29 MED ORDER — VENLAFAXINE HCL ER 75 MG PO CP24: 75.0000 mg | ORAL_CAPSULE | Freq: Every day | ORAL | 0 refills | Status: AC

## 2021-12-29 MED ORDER — AMLODIPINE BESYLATE 5 MG PO TABS: 5.0000 mg | ORAL_TABLET | Freq: Every day | ORAL | 0 refills | Status: DC

## 2021-12-29 MED ORDER — DOXYCYCLINE HYCLATE 100 MG PO TABS
100.0000 mg | ORAL_TABLET | Freq: Two times a day (BID) | ORAL | Status: DC
  Administered 2021-12-29: 100 mg via ORAL
  Filled 2021-12-29: qty 1

## 2021-12-29 MED ORDER — POTASSIUM CHLORIDE CRYS ER 20 MEQ PO TBCR
40.0000 meq | EXTENDED_RELEASE_TABLET | Freq: Every day | ORAL | Status: DC
  Administered 2021-12-29: 40 meq via ORAL
  Filled 2021-12-29: qty 2

## 2021-12-29 MED ORDER — AMOXICILLIN-POT CLAVULANATE 875-125 MG PO TABS: 1.0000 | ORAL_TABLET | Freq: Two times a day (BID) | ORAL | 0 refills | Status: DC

## 2021-12-29 MED ORDER — VENLAFAXINE HCL ER 75 MG PO CP24: 75.0000 mg | ORAL_CAPSULE | Freq: Every day | ORAL | Status: DC

## 2021-12-29 NOTE — Progress Notes (Signed)
PODIATRY PROGRESS NOTE  NAME [name] MRN [id number] DOB 1955/10/16 DOA 12/25/2021   Reason for consult:  Chief Complaint  Patient presents with   Post-op Problem     History of present illness: 66 y.o. female admitted to the hospital for wound infection, dehiscence status post Achilles tendon rupture repair and gastrocnemius recession with Dr. Boneta Lucks.  Overall stable from prior visit yesterday, wanted to discuss results of blood test and CT scan. Pain controlled.    Vitals:   12/26/21 2128 12/27/21 0541  BP: (!) 143/91 (!) 153/93  Pulse: 76 77  Resp: 16 16  Temp: 97.9 F (36.6 C) 98.1 F (36.7 C)  SpO2: 99% 99%       Latest Ref Rng & Units 12/27/2021    3:23 AM 12/26/2021    3:21 AM 12/25/2021    7:49 PM  CBC  WBC 4.0 - 10.5 K/uL 6.7  8.3  9.3   Hemoglobin 12.0 - 15.0 g/dL 12.9  12.6  13.2   Hematocrit 36.0 - 46.0 % 38.7  38.3  39.7   Platelets 150 - 400 K/uL 267  287  300        Latest Ref Rng & Units 12/27/2021    3:23 AM 12/26/2021    3:21 AM 12/25/2021    7:49 PM  BMP  Glucose 70 - 99 mg/dL 112  131    BUN 8 - 23 mg/dL 20  21    Creatinine 0.44 - 1.00 mg/dL 0.60  0.70  0.75   Sodium 135 - 145 mmol/L 142  144    Potassium 3.5 - 5.1 mmol/L 2.9  3.0    Chloride 98 - 111 mmol/L 104  106    CO2 22 - 32 mmol/L 28  28    Calcium 8.9 - 10.3 mg/dL 9.4  9.2        Physical Exam: General: AOx3, NAD  Dermatology: See pictures below.  Gastrocnemius resection site with staples intact with some scabbing and some localized edema erythema to this area.  Distally on the area of Achilles tendon repair there is a wound present along the distal portion which is full-thickness but not able to x-ray the Achilles tendon.  There is macerated tissue along the incision.  There is still some localized erythema but there is no ascending cellulitis.  There is no purulence noted.        Vascular: DP, PT pulses palpable  Neurological: Sensation intact  Musculoskeletal  Exam: Pain to the surgical site    ASSESSMENT/PLAN OF CARE  Wound dehiscence, cellulitis left side status post Achilles tendon repair.  -Discussed findings of CT scan with the patient, no abscess or deep infection identified. Post surgical changes.Repeat ESR 17 (from 23 4 days ago) and CRP 0.7 (from 1.6) downtrend. - Dressing changed and wound evaluated, will switch to betadine dressings for wound maceration -Stable for discharge home with oral antibiotics, local wound care and follow-up in the office with Dr. Posey Pronto next week.  - Recommend 10 days course of Bactrim DS BID and Ciprofloxacin for broad spectrum coverage - Placed order for home healthcare dressing changes MWF with betadine wet to dry dressings. Will need this arranged prior to DC -Continue NWB, CAM boot to LLE at all times, elevate extremity -Podiatry will continue to follow, all patient questions answered    Altamese Cabal DPM    [address]  [place], [place] [address]                Office [phone]  Fax [phone]

## 2021-12-29 NOTE — TOC Transition Note (Signed)
Transition of Care [facility]) - CM/SW Discharge Note   Patient Details  Name: [name] MRN: [id number] Date of Birth: 1955-05-06  Transition of Care [facility]) CM/SW Contact:  Servando Snare, LCSW Phone Number: 12/29/2021, 1:26 PM   Clinical Narrative:   Home health RN arranged with [facility].     Final next level of care: [place] Barriers to Discharge: No Barriers Identified   Patient Goals and CMS Choice     Choice offered to / list presented to : NA  Discharge Placement                       Discharge Plan and Services                DME Arranged: N/A DME Agency: NA       HH Arranged: NA HH Agency: [place] Date [place]: 12/29/21 Time [place]: 1025 Representative spoke with at [place]: [place] Determinants of Health ([place]) Interventions     Readmission Risk Interventions     No data to display

## 2021-12-29 NOTE — Discharge Summary (Signed)
Physician Discharge Summary  [name] SNK:[id number] DOB: 1955-09-12 DOA: 12/25/2021  PCP: Lujean Amel, MD  Admit date: 12/25/2021 Discharge date: 12/29/2021  Admitted From: Home Disposition:  Home  Discharge Condition:Stable CODE STATUS:FULL Diet recommendation: Heart Healthy   Brief/Interim Summary:  Patient is a 66 year old female with history of asthma, hepatitis C , hypertension, depression, obesity who presented with complaints of worsening left Achilles wound.  She recently underwent left Achilles tendon repair with gastrocnemius recession with Haglund's resection on 12/12/2021 by podiatry.  Postop, she had persistent pain, erythema at incision site and was treated with doxycycline.  Her left ankle wound pain became persistent, pain was worse with movement, development of wound erythema, drainage , edema despite being transitioned to Levaquin.  Podiatry following here.  CT of the left foot showed complete tear of distal Achilles tendon with no evidence of deep abscess or infection.  Podiatry cleared her for discharge with oral antibiotics, plan for follow-up next week in the office for dressing changes.   Following problems were addressed during her hospitalization:  Achillis tendon tear/infected surgical wound: Information as above.  Failed multiple treatment with antibiotics.  Left lower extremity duplex negative for DVT.  Cultures without growth till date.CT of the left foot showed complete tear of distal Achilles tendon with no evidence of deep abscess or infection.  Podiatry cleared her for discharge with oral antibiotics, plan for follow-up next week in the office for dressing changes.  Nonweightbearing recommended for now.   Asthma: No signs of exacerbation   Hypertension: Currently blood pressure stable.  On metoprolol, chlorthalidone at home.  She is persistently hypokalemic chlorthalidone will be discontinued and she will be started on amlodipine 5 mg daily.  She has been  recommended to monitor her blood pressure at home.   Hypokalemia: We will continue supplementation for 5 more days.  Check BMP in a week to check potassium level.   Depression: On venlafaxine, dose doubled to 75 mg daily because patient complains of persistent depressed mood   Prediabetes: Hemoglobin A1c 5.7   Obesity: BMI of 38.9  Discharge Diagnoses:  Principal Problem:   Infected surgical wound Active Problems:   Asthma, chronic   Essential hypertension   Hypokalemia   Depression   Obesity (BMI 30-39.9)   Prediabetes    Discharge Instructions  Discharge Instructions     Diet - low sodium heart healthy   Complete by: As directed    Discharge instructions   Complete by: As directed    1)Please take prescribed medications as instructed 2)Follow up with your podiatrist next week for wound check and dressing changes 3)Follow PCP in a week.  Do a BMP test during the follow-up to check your potassium level 4)Monitor your blood pressure at home 5)Continue non weight bearing , CAM boot to left lower extremity at all times, elevate extremity   Discharge wound care:   Complete by: As directed    As per podiatry   Increase activity slowly   Complete by: As directed       Allergies as of 12/29/2021       Reactions   Atenolol    FATIGUE/ HYPOTENSION   Lisinopril    COUGH/ WHEEZING   Paxil [paroxetine Hcl]    INCREASED APPETITE FOR SWEETS   Zoloft [sertraline Hcl]    TINNITUS   Elemental Sulfur Rash   FEVER        Medication List     STOP taking these medications    chlorthalidone  25 MG tablet Commonly known as: HYGROTON   levofloxacin 500 MG tablet Commonly known as: LEVAQUIN       TAKE these medications    acetaminophen-codeine 300-30 MG tablet Commonly known as: TYLENOL #3 Take 1-2 tablets by mouth every 4 (four) hours as needed for moderate pain. What changed: Another medication with the same name was removed. Continue taking this medication, and  follow the directions you see here.   amLODipine 5 MG tablet Commonly known as: NORVASC Take 1 tablet (5 mg total) by mouth daily. Start taking on: December 30, 2021   amoxicillin-clavulanate 875-125 MG tablet Commonly known as: AUGMENTIN Take 1 tablet by mouth every 12 (twelve) hours for 10 days.   Calcium-Vitamin D3 600-400 MG-UNIT Tabs Take 1 tablet by mouth daily.   doxycycline 100 MG tablet Commonly known as: VIBRA-TABS Take 1 tablet (100 mg total) by mouth every 12 (twelve) hours for 10 days. What changed: when to take this   EPINEPHrine 0.3 mg/0.3 mL Soaj injection Commonly known as: EPI-PEN Inject 0.3 mg into the muscle as needed for anaphylaxis.   gabapentin 100 MG capsule Commonly known as: NEURONTIN Take 1 capsule (100 mg total) by mouth 3 (three) times daily.   ibuprofen 800 MG tablet Commonly known as: ADVIL Take 1 tablet (800 mg total) by mouth every 6 (six) hours as needed.   Metoprolol Tartrate 75 MG Tabs Take 75 mg by mouth 2 (two) times daily. What changed:  medication strength See the new instructions.   MULTIVITAMIN PO Take 1 tablet by mouth daily.   omeprazole 40 MG capsule Commonly known as: PRILOSEC Take 40 mg by mouth daily.   oxyCODONE-acetaminophen 10-325 MG tablet Commonly known as: PERCOCET Take 1 tablet by mouth every 4 (four) hours as needed for pain.   potassium chloride SA 20 MEQ tablet Commonly known as: KLOR-CON M Take 2 tablets (40 mEq total) by mouth daily for 5 days. Start taking on: December 30, 2021   rivaroxaban 10 MG Tabs tablet Commonly known as: XARELTO Take 1 tablet (10 mg total) by mouth daily.   venlafaxine XR 75 MG 24 hr capsule Commonly known as: EFFEXOR-XR Take 1 capsule (75 mg total) by mouth daily with breakfast. Start taking on: December 30, 2021 What changed:  medication strength how much to take               Discharge Care Instructions  (From admission, onward)           Start      Ordered   12/29/21 0000  Discharge wound care:       Comments: As per podiatry   12/29/21 1049            Follow-up Information     Koirala, [name], MD. Schedule an appointment as soon as possible for a visit in 1 week(s).   Specialty: Family Medicine Contact information: [address] [place] [address] [phone]                Allergies  Allergen Reactions   Atenolol     FATIGUE/ HYPOTENSION   Lisinopril     COUGH/ WHEEZING   Paxil [Paroxetine Hcl]     INCREASED APPETITE FOR SWEETS   Zoloft [Sertraline Hcl]     TINNITUS   Elemental Sulfur Rash    FEVER    Consultations: Podiatry   Procedures/Studies: CT ANKLE LEFT W CONTRAST  Result Date: 12/28/2021 CLINICAL DATA:  ACHILLES TENDON TRAUMA. EXAM:  CT OF THE LEFT ANKLE WITH CONTRAST TECHNIQUE: Multidetector CT imaging of the left ankle was performed following the standard protocol during bolus administration of intravenous contrast. RADIATION DOSE REDUCTION: This exam was performed according to the departmental dose-optimization program which includes automated exposure control, adjustment of the mA and/or kV according to patient size and/or use of iterative reconstruction technique. CONTRAST:  162m OMNIPAQUE IOHEXOL 300 MG/ML  SOLN COMPARISON:  MR ANKLE 10/29/2021 FINDINGS: Bones/Joint/Cartilage No fracture or dislocation. Normal alignment. No joint effusion. Postsurgical changes in the posterior calcaneus from prior Achilles repair. Small plantar calcaneal spur. Mild osteoarthritis of the talonavicular joint. Mild osteoarthritis of the second and third TMT joints. Mild osteoarthritis of the tibiotalar joint. Ligaments Ligaments are suboptimally evaluated by CT. Muscles and Tendons Muscles are normal. No muscle atrophy. No intramuscular fluid collection or hematoma. Complete tear of the distal Achilles tendon. Soft tissue No fluid collection or hematoma.  No soft tissue mass. IMPRESSION: 1.  Complete tear of the distal Achilles tendon. 2. Postsurgical changes in the posterior calcaneus from prior Achilles repair. 3. Mild osteoarthritis of the talonavicular joint, second and third TMT joints, and tibiotalar joint. Electronically Signed   By: HKathreen DevoidM.D.   On: 12/28/2021 13:04   VAS [place] WITH/WO TBI  Result Date: 12/27/2021  LOWER EXTREMITY DOPPLER STUDY Patient Name:  DRAYLEN KEN Date of Exam:   12/27/2021 Medical Rec #: [id number]        Accession #:    [id number] of Birth: [birth year]        Patient Gender: F Patient Age:   89years [facility]      [facility] [place] WITH/WO TBI Referring Phys: MCelesta Gentile--------------------------------------------------------------------------------  Indications: Peripheral artery disease. High Risk Factors: Hypertension.  Comparison Study: No prior studies. Performing Technologist: CCarlos LeveringRVT  Examination Guidelines: A complete evaluation includes at minimum, Doppler waveform signals and systolic blood pressure reading at the level of bilateral brachial, anterior tibial, and posterior tibial arteries, when vessel segments are accessible. Bilateral testing is considered an integral part of a complete examination. Photoelectric Plethysmograph (PPG) waveforms and toe systolic pressure readings are included as required and additional duplex testing as needed. Limited examinations for reoccurring indications may be performed as noted.  ABI Findings: +--------+------------------+-----+-----------+--------+ Right   Rt Pressure (mmHg)IndexWaveform   Comment  +--------+------------------+-----+-----------+--------+ [id number]                   triphasic           +--------+------------------+-----+-----------+--------+ PTA     147               1.04 triphasic           +--------+------------------+-----+-----------+--------+ DP      131               0.92 multiphasic          +--------+------------------+-----+-----------+--------+ +--------+------------------+-----+---------+-------+ Left    Lt Pressure (mmHg)IndexWaveform Comment +--------+------------------+-----+---------+-------+ [id number]                   triphasic        +--------+------------------+-----+---------+-------+ PTA     157               1.11 triphasic        +--------+------------------+-----+---------+-------+ DP      146               1.03  triphasic        +--------+------------------+-----+---------+-------+ +-------+-----------+-----------+------------+------------+ ABI/TBIToday's ABIToday's TBIPrevious ABIPrevious TBI +-------+-----------+-----------+------------+------------+ Right  1.04                                           +-------+-----------+-----------+------------+------------+ Left   1.11                                           +-------+-----------+-----------+------------+------------+  Summary: Right: Resting right ankle-brachial index is within normal range. Left: Resting left ankle-brachial index is within normal range. *See table(s) above for measurements and observations.  Electronically signed by Deitra Mayo MD on 12/27/2021 at 5:58:25 PM.    Final    VAS [place] LOWER EXTREMITY VENOUS (DVT)  Addendum Date: 12/26/2021   There is no DVT noted in the visualized portions of the left posterior tibial and peroneal veins. Sharion Dove RVS Electronically Amended 12/26/2021, 7:10 PM   Final Loreli Dollar)    Addendum Date: 12/26/2021   There is no DVT noted in the visualized portions of the left posterior tibial and peroneal veins. Sharion Dove RVS Electronically Amended 12/26/2021, 7:10 PM   Final Loreli Dollar)    Addendum Date: 12/26/2021   There is no DVT noted in the visualized portions of the left posterior tibial and peroneal veins. Sharion Dove RVS Electronically Amended 12/26/2021, 7:10 PM   Final Loreli Dollar)    Result Date: 12/26/2021  Lower Venous  DVT Study Patient Name:  FREDDIE NGHIEM  Date of Exam:   12/26/2021 Medical Rec #: [id number]         Accession #:    [id number] Date of Birth: 1955/07/12         Patient Gender: F Patient Age:   66 years Exam Location:  [facility] Procedure:      VAS [place] LOWER EXTREMITY VENOUS (DVT) Referring Phys: Sander Radon --------------------------------------------------------------------------------  Indications: Swelling, and Infection status post Achilles tendon repair.  Limitations: Bandages and orthopaedic appliance. Comparison Study: Prior negative Bilateral LEV done 05/30/20 Performing Technologist: Sharion Dove RVS  Examination Guidelines: A complete evaluation includes B-mode imaging, spectral Doppler, color Doppler, and power Doppler as needed of all accessible portions of each vessel. Bilateral testing is considered an integral part of a complete examination. Limited examinations for reoccurring indications may be performed as noted. The reflux portion of the exam is performed with the patient in reverse Trendelenburg.  +---------+---------------+---------+-----------+----------+--------------+ RIGHT    CompressibilityPhasicitySpontaneityPropertiesThrombus Aging +---------+---------------+---------+-----------+----------+--------------+ CFV      Full           Yes      Yes                                 +---------+---------------+---------+-----------+----------+--------------+ SFJ      Full                                                        +---------+---------------+---------+-----------+----------+--------------+ FV Prox  Full                                                        +---------+---------------+---------+-----------+----------+--------------+  FV Mid   Full                                                        +---------+---------------+---------+-----------+----------+--------------+ FV DistalFull                                                         +---------+---------------+---------+-----------+----------+--------------+ PFV      Full                                                        +---------+---------------+---------+-----------+----------+--------------+ POP      Full           Yes      Yes                                 +---------+---------------+---------+-----------+----------+--------------+ PTV      Full                                                        +---------+---------------+---------+-----------+----------+--------------+ PERO     Full                                                        +---------+---------------+---------+-----------+----------+--------------+   +---------+---------------+---------+-----------+----------+--------------+ LEFT     CompressibilityPhasicitySpontaneityPropertiesThrombus Aging +---------+---------------+---------+-----------+----------+--------------+ CFV      Full           Yes      Yes                                 +---------+---------------+---------+-----------+----------+--------------+ SFJ      Full                                                        +---------+---------------+---------+-----------+----------+--------------+ FV Prox  Full                                                        +---------+---------------+---------+-----------+----------+--------------+ FV Mid   Full                                                        +---------+---------------+---------+-----------+----------+--------------+  FV DistalFull                                                        +---------+---------------+---------+-----------+----------+--------------+ PFV      Full                                                        +---------+---------------+---------+-----------+----------+--------------+ POP      Full           Yes      Yes                                  +---------+---------------+---------+-----------+----------+--------------+ PTV      Full                                                        +---------+---------------+---------+-----------+----------+--------------+ PERO     Full                                                        +---------+---------------+---------+-----------+----------+--------------+ Gastroc  Full                                                        +---------+---------------+---------+-----------+----------+--------------+ Posterior tibial and peroneal veins appear DVT to mid calf. New Bandages and boot in place from lower mid calf down.    Summary: RIGHT: - There is no evidence of deep vein thrombosis in the lower extremity.  LEFT: - There is no evidence of deep vein thrombosis in the lower extremity. However, portions of this examination were limited- see technologist comments above.  - No cystic structure found in the popliteal fossa.  *See table(s) above for measurements and observations. Electronically signed by [name] Martinez MD on 12/26/2021 at 1:31:07 PM.    Final (Amended)    DG Ankle Complete Left  Result Date: 12/25/2021 CLINICAL DATA:  Wound infection. EXAM: LEFT ANKLE COMPLETE - 3+ VIEW COMPARISON:  December 21, 2021. FINDINGS: Surgical staples are again noted posteriorly in the ankle and foot. Moderate posterior calcaneal spurring is noted. No fracture or dislocation is noted. No lytic destruction is noted to suggest osteomyelitis. IMPRESSION: Postsurgical changes as described above. No definite acute abnormality is noted. Electronically Signed   By: Marijo Conception M.D.   On: 12/25/2021 15:58   DG Ankle Complete Left  Result Date: 12/21/2021 Please see detailed radiograph report in office note.     Subjective: Patient seen and examined at the bedside this morning.  Hemodynamically stable for discharge today.  I called the daughter and updated about the discharge plan.  Discharge  Exam: Vitals:   12/28/21 2117 12/29/21 0614  BP: 139/83 132/77  Pulse: 88 83  Resp: 17 17  Temp: 98 F (36.7 C) 98.1 F (36.7 C)  SpO2: 100% 96%   Vitals:   12/28/21 0540 12/28/21 1339 12/28/21 2117 12/29/21 0614  BP: (!) 147/82 135/77 139/83 132/77  Pulse: 79 81 88 83  Resp: '17 18 17 17  '$ Temp: 98 F (36.7 C) 98.7 F (37.1 C) 98 F (36.7 C) 98.1 F (36.7 C)  TempSrc: Oral  Oral Oral  SpO2: 98% 98% 100% 96%  Weight:      Height:        General: Pt is alert, awake, not in acute distress Cardiovascular: RRR, S1/S2 +, no rubs, no gallops Respiratory: CTA bilaterally, no wheezing, no rhonchi Abdominal: Soft, NT, ND, bowel sounds + Extremities: no edema, no cyanosis, boot on the left foot    The results of significant diagnostics from this hospitalization (including imaging, microbiology, ancillary and laboratory) are listed below for reference.     Microbiology: Recent Results (from the past 240 hour(s))  Blood culture (routine x 2)     Status: None (Preliminary result)   Collection Time: 12/25/21  4:10 PM   Specimen: BLOOD  Result Value Ref Range Status   Specimen Description   Final    BLOOD LEFT ANTECUBITAL Performed at [facility] [address], [place], [place] [address]    Special Requests   Final    BOTTLES DRAWN AEROBIC AND ANAEROBIC Blood Culture results may not be optimal due to an excessive volume of blood received in culture bottles Performed at [facility] [address], [place], [place] [address]    Culture   Final    NO GROWTH 4 DAYS Performed at [facility], [place] [address] [place], [place] [address]    Report Status PENDING  Incomplete  Blood culture (routine x 2)     Status: None (Preliminary result)   Collection Time: 12/25/21  7:49 PM   Specimen: BLOOD  Result Value Ref Range Status   Specimen Description   Final    BLOOD RIGHT ANTECUBITAL Performed at [facility] [address], [place], [place] [address]    Special Requests   Final    BOTTLES DRAWN AEROBIC ONLY Blood Culture results may not be optimal due to an inadequate volume of blood received in culture bottles Performed at [facility] [address], [place], [place] [address]    Culture   Final    NO GROWTH 4 DAYS Performed at [facility], [place] [address], [place], [place] [address]    Report Status PENDING  Incomplete     Labs: BNP (last 3 results) No results for input(s): "BNP" in the last 8760 hours. Basic Metabolic Panel: Recent Labs  Lab 12/25/21 1545 12/25/21 1949 12/26/21 0321 12/27/21 0323 12/28/21 0330 12/29/21 0405  NA 142  --  144 142 146* 145  K 2.7*  --  3.0* 2.9* 3.1* 3.3*  CL 99  --  106 104 107 105  CO2 30  --  '28 28 29 29  '$ GLUCOSE 136*  --  131* 112* 113* 105*  BUN 29*  --  '21 20 19 17  '$ CREATININE 0.87 0.75 0.70 0.60 0.73 0.80  CALCIUM 9.5  --  9.2 9.4 9.8 9.6  MG  --   --   --  1.8 1.9  --  Liver Function Tests: No results for input(s): "AST", "ALT", "ALKPHOS", "BILITOT", "PROT", "ALBUMIN" in the last 168 hours. No results for input(s): "LIPASE", "AMYLASE" in the last 168 hours. No results for input(s): "AMMONIA" in the last 168 hours. CBC: Recent Labs  Lab 12/25/21 1545 12/25/21 1949 12/26/21 0321 12/27/21 0323 12/28/21 0330  WBC 8.4 9.3 8.3 6.7 7.4  NEUTROABS  --   --   --  2.6 3.2  HGB 13.9 13.2 12.6 12.9 13.3  HCT 41.4 39.7 38.3 38.7 40.5  MCV 87.5 88.0 89.1 89.2 89.2  PLT 338 300 287 267 290   Cardiac Enzymes: No results for input(s): "CKTOTAL", "CKMB", "CKMBINDEX", "TROPONINI" in the last 168 hours. BNP: Invalid input(s): "POCBNP" CBG: No results for input(s): "GLUCAP" in the last 168 hours. D-Dimer No results for input(s): "DDIMER" in the last 72 hours. Hgb A1c No results for input(s): "HGBA1C" in the last 72 hours. Lipid Profile No results for input(s): "CHOL", "HDL", "LDLCALC", "TRIG", "CHOLHDL",  "LDLDIRECT" in the last 72 hours. Thyroid function studies No results for input(s): "TSH", "T4TOTAL", "T3FREE", "THYROIDAB" in the last 72 hours.  Invalid input(s): "FREET3" Anemia work up No results for input(s): "VITAMINB12", "FOLATE", "FERRITIN", "TIBC", "IRON", "RETICCTPCT" in the last 72 hours. Urinalysis    Component Value Date/Time   COLORURINE YELLOW 06/01/2020 1609   APPEARANCEUR CLEAR 06/01/2020 1609   LABSPEC 1.029 06/01/2020 1609   PHURINE 5.0 06/01/2020 1609   GLUCOSEU NEGATIVE 06/01/2020 1609   HGBUR NEGATIVE 06/01/2020 1609   BILIRUBINUR NEGATIVE 06/01/2020 1609   KETONESUR NEGATIVE 06/01/2020 1609   PROTEINUR NEGATIVE 06/01/2020 1609   NITRITE NEGATIVE 06/01/2020 1609   LEUKOCYTESUR NEGATIVE 06/01/2020 1609   Sepsis Labs Recent Labs  Lab 12/25/21 1949 12/26/21 0321 12/27/21 0323 12/28/21 0330  WBC 9.3 8.3 6.7 7.4   Microbiology Recent Results (from the past 240 hour(s))  Blood culture (routine x 2)     Status: None (Preliminary result)   Collection Time: 12/25/21  4:10 PM   Specimen: BLOOD  Result Value Ref Range Status   Specimen Description   Final    BLOOD LEFT ANTECUBITAL Performed at [facility], [place] [address], [place], [place] [address]    Special Requests   Final    BOTTLES DRAWN AEROBIC AND ANAEROBIC Blood Culture results may not be optimal due to an excessive volume of blood received in culture bottles Performed at [facility] [address], [place], [place] [address]    Culture   Final    NO GROWTH 4 DAYS Performed at [facility], [place] [address] [place], [place] [address]    Report Status PENDING  Incomplete  Blood culture (routine x 2)     Status: None (Preliminary result)   Collection Time: 12/25/21  7:49 PM   Specimen: BLOOD  Result Value Ref Range Status   Specimen Description   Final    BLOOD RIGHT ANTECUBITAL Performed at [facility] [address],  [place], [place] [address]    Special Requests   Final    BOTTLES DRAWN AEROBIC ONLY Blood Culture results may not be optimal due to an inadequate volume of blood received in culture bottles Performed at [facility] [address] [place], [place] [address]    Culture   Final    NO GROWTH 4 DAYS Performed at [facility], [place] [address], [place], [place] [address]    Report Status PENDING  Incomplete  Please note: You were cared for by a hospitalist during your hospital stay. Once you are discharged, your primary care physician will handle any further medical issues. Please note that NO REFILLS for any discharge medications will be authorized once you are discharged, as it is imperative that you return to your primary care physician (or establish a relationship with a primary care physician if you do not have one) for your post hospital discharge needs so that they can reassess your need for medications and monitor your lab values.    Time coordinating discharge: 40 minutes  SIGNED:   Shelly Coss, MD  [facility] 12/29/2021, 10:49 AM Pager [phone]  If 7PM-7AM, please contact night-coverage [email or web address] Password [email or web address]

## 2021-12-29 NOTE — TOC Progression Note (Signed)
Transition of Care [facility]) - Progression Note    Patient Details  Name: [name] MRN: [id number] Date of Birth: 09-07-1955  Transition of Care [facility]) CM/SW Contact  Servando Snare, [place] Phone Number: 12/29/2021, 12:22 PM  Clinical Narrative:     [facility] faxed clinicals to [facility]. Anderson Malta out and backup Lattie Haw, is driving. Called main number who stated to fax info ATTN: Thornell Sartorius. Awaiting response.       Barriers to Discharge: Continued Medical Work up  Expected Discharge Plan and Services           Expected Discharge Date: 12/29/21                                     Social Determinants of Health (SDOH) Interventions    Readmission Risk Interventions     No data to display

## 2021-12-30 ENCOUNTER — Telehealth: Payer: Self-pay | Admitting: *Deleted

## 2021-12-30 DIAGNOSIS — K209 Esophagitis, unspecified without bleeding: Secondary | ICD-10-CM | POA: Diagnosis not present

## 2021-12-30 DIAGNOSIS — J45909 Unspecified asthma, uncomplicated: Secondary | ICD-10-CM | POA: Diagnosis not present

## 2021-12-30 DIAGNOSIS — B1920 Unspecified viral hepatitis C without hepatic coma: Secondary | ICD-10-CM | POA: Diagnosis not present

## 2021-12-30 DIAGNOSIS — E669 Obesity, unspecified: Secondary | ICD-10-CM | POA: Diagnosis not present

## 2021-12-30 DIAGNOSIS — T8149XD Infection following a procedure, other surgical site, subsequent encounter: Secondary | ICD-10-CM | POA: Diagnosis not present

## 2021-12-30 DIAGNOSIS — I10 Essential (primary) hypertension: Secondary | ICD-10-CM | POA: Diagnosis not present

## 2021-12-30 DIAGNOSIS — Z6837 Body mass index (BMI) 37.0-37.9, adult: Secondary | ICD-10-CM | POA: Diagnosis not present

## 2021-12-30 DIAGNOSIS — Z792 Long term (current) use of antibiotics: Secondary | ICD-10-CM | POA: Diagnosis not present

## 2021-12-30 DIAGNOSIS — F32A Depression, unspecified: Secondary | ICD-10-CM | POA: Diagnosis not present

## 2021-12-30 DIAGNOSIS — Z7901 Long term (current) use of anticoagulants: Secondary | ICD-10-CM | POA: Diagnosis not present

## 2021-12-30 LAB — CULTURE, BLOOD (ROUTINE X 2)
Culture: NO GROWTH
Culture: NO GROWTH

## 2021-12-30 MED ORDER — OXYCODONE-ACETAMINOPHEN 10-325 MG PO TABS: 1.0000 | ORAL_TABLET | ORAL | 0 refills | Status: DC | PRN

## 2021-12-30 NOTE — Telephone Encounter (Signed)
Called and spoke with Kathlee Nations [phone]) giving information per physician, verbalized understanding.

## 2021-12-30 NOTE — Telephone Encounter (Signed)
Patient was recently discharged from hospital for an infected heel, requesting a pain medicine refill(oxy-ace, 10-325 mg). She is also calling for status of a wound vac that was supposed to be order,wound care nurse is coming today @ 11:30,

## 2021-12-30 NOTE — Telephone Encounter (Signed)
Would like the order for wound vac to go thru [facility] for insurance purposes.

## 2022-01-02 ENCOUNTER — Telehealth: Payer: Self-pay | Admitting: *Deleted

## 2022-01-02 DIAGNOSIS — T8130XA Disruption of wound, unspecified, initial encounter: Secondary | ICD-10-CM

## 2022-01-02 NOTE — Telephone Encounter (Signed)
[name] w/ [facility] is calling to request that a prescription for the wound vac & H&P be sent. Please advise.

## 2022-01-03 NOTE — Telephone Encounter (Signed)
[phone]) order to [facility] -01/03/22,confirmation received,

## 2022-01-03 NOTE — Telephone Encounter (Signed)
Spoke with Nira Conn @ [facility] to informing that order has been faxed,verbalized understanding.

## 2022-01-04 ENCOUNTER — Ambulatory Visit (INDEPENDENT_AMBULATORY_CARE_PROVIDER_SITE_OTHER): Payer: PPO | Admitting: Podiatry

## 2022-01-04 DIAGNOSIS — S91002A Unspecified open wound, left ankle, initial encounter: Secondary | ICD-10-CM | POA: Diagnosis not present

## 2022-01-04 DIAGNOSIS — T8131XA Disruption of external operation (surgical) wound, not elsewhere classified, initial encounter: Secondary | ICD-10-CM | POA: Diagnosis not present

## 2022-01-04 DIAGNOSIS — Z9889 Other specified postprocedural states: Secondary | ICD-10-CM

## 2022-01-04 DIAGNOSIS — T8130XA Disruption of wound, unspecified, initial encounter: Secondary | ICD-10-CM

## 2022-01-04 DIAGNOSIS — T8189XA Other complications of procedures, not elsewhere classified, initial encounter: Secondary | ICD-10-CM | POA: Diagnosis not present

## 2022-01-04 MED ORDER — AMOXICILLIN-POT CLAVULANATE 875-125 MG PO TABS: 1.0000 | ORAL_TABLET | Freq: Two times a day (BID) | ORAL | 0 refills | Status: AC

## 2022-01-04 MED ORDER — OXYCODONE-ACETAMINOPHEN 10-325 MG PO TABS: 1.0000 | ORAL_TABLET | ORAL | 0 refills | Status: DC | PRN

## 2022-01-06 ENCOUNTER — Other Ambulatory Visit: Payer: Self-pay | Admitting: Podiatry

## 2022-01-06 DIAGNOSIS — F321 Major depressive disorder, single episode, moderate: Secondary | ICD-10-CM | POA: Diagnosis not present

## 2022-01-06 DIAGNOSIS — S91302A Unspecified open wound, left foot, initial encounter: Secondary | ICD-10-CM | POA: Diagnosis not present

## 2022-01-06 DIAGNOSIS — T8149XD Infection following a procedure, other surgical site, subsequent encounter: Secondary | ICD-10-CM | POA: Diagnosis not present

## 2022-01-06 DIAGNOSIS — T8130XA Disruption of wound, unspecified, initial encounter: Secondary | ICD-10-CM

## 2022-01-06 DIAGNOSIS — S86002D Unspecified injury of left Achilles tendon, subsequent encounter: Secondary | ICD-10-CM | POA: Diagnosis not present

## 2022-01-06 DIAGNOSIS — E876 Hypokalemia: Secondary | ICD-10-CM | POA: Diagnosis not present

## 2022-01-07 ENCOUNTER — Other Ambulatory Visit: Payer: Self-pay

## 2022-01-07 ENCOUNTER — Emergency Department (HOSPITAL_BASED_OUTPATIENT_CLINIC_OR_DEPARTMENT_OTHER)
Admission: EM | Admit: 2022-01-07 | Discharge: 2022-01-07 | Disposition: A | Discharge status: Home / Self Care | Payer: PPO | Attending: Emergency Medicine | Admitting: Emergency Medicine

## 2022-01-07 ENCOUNTER — Encounter (HOSPITAL_BASED_OUTPATIENT_CLINIC_OR_DEPARTMENT_OTHER): Payer: Self-pay

## 2022-01-07 DIAGNOSIS — L509 Urticaria, unspecified: Secondary | ICD-10-CM | POA: Diagnosis present

## 2022-01-07 DIAGNOSIS — L500 Allergic urticaria: Secondary | ICD-10-CM | POA: Insufficient documentation

## 2022-01-07 DIAGNOSIS — Z7901 Long term (current) use of anticoagulants: Secondary | ICD-10-CM | POA: Insufficient documentation

## 2022-01-07 MED ORDER — METHYLPREDNISOLONE SODIUM SUCC 125 MG IJ SOLR
125.0000 mg | Freq: Once | INTRAMUSCULAR | Status: AC
  Administered 2022-01-07: 125 mg via INTRAVENOUS
  Filled 2022-01-07: qty 2

## 2022-01-07 MED ORDER — METHYLPREDNISOLONE 4 MG PO TBPK: ORAL_TABLET | ORAL | 0 refills | Status: DC

## 2022-01-07 MED ORDER — HYDROXYZINE HCL 25 MG PO TABS: 25.0000 mg | ORAL_TABLET | Freq: Four times a day (QID) | ORAL | 0 refills | Status: AC | PRN

## 2022-01-07 MED ORDER — EPINEPHRINE 0.3 MG/0.3ML IJ SOAJ: 0.3000 mg | INTRAMUSCULAR | 2 refills | Status: AC | PRN

## 2022-01-07 MED ORDER — FAMOTIDINE 20 MG PO TABS
40.0000 mg | ORAL_TABLET | Freq: Once | ORAL | Status: AC
  Administered 2022-01-07: 40 mg via ORAL
  Filled 2022-01-07: qty 2

## 2022-01-07 MED ORDER — DIPHENHYDRAMINE HCL 50 MG/ML IJ SOLN
25.0000 mg | Freq: Once | INTRAMUSCULAR | Status: AC
  Administered 2022-01-07: 25 mg via INTRAVENOUS
  Filled 2022-01-07: qty 1

## 2022-01-07 MED ORDER — LACTATED RINGERS IV BOLUS
1000.0000 mL | Freq: Once | INTRAVENOUS | Status: AC
  Administered 2022-01-07: 1000 mL via INTRAVENOUS

## 2022-01-07 MED ORDER — PREDNISONE 50 MG PO TABS
60.0000 mg | ORAL_TABLET | Freq: Once | ORAL | Status: AC
  Administered 2022-01-07: 60 mg via ORAL
  Filled 2022-01-07: qty 1

## 2022-01-07 MED ORDER — DIPHENHYDRAMINE HCL 25 MG PO CAPS
25.0000 mg | ORAL_CAPSULE | Freq: Once | ORAL | Status: AC
  Administered 2022-01-07: 25 mg via ORAL
  Filled 2022-01-07: qty 1

## 2022-01-07 NOTE — ED Provider Notes (Signed)
  Physical Exam  BP 125/72   Pulse 87   Temp 98 F (36.7 C) (Oral)   Resp 18   Ht '5\' 4"'$  (1.626 m)   Wt 98.4 kg   SpO2 97%   BMI 37.25 kg/m   Physical Exam  Procedures  Procedures  ED Course / MDM    Medical Decision Making Risk Prescription drug management.   Patient received as signout from earlier ED provider.  Concern for urticaria and some lip tingling and swelling earlier, likely spontaneous.  She is not on lisinopril or any other known irritants or allergies.  She was given steroids and additional medications on arrival.  On my reassessment she appears significantly improved, does not have any visible lip swelling, no tongue or uvula swelling, denies any throat tightness, has no stridor.  Denies any evidence of airway compromise.  I do not see any indication for emergent hospitalization.  She has been observed for nearly 6 hours and stable in the ED.  I think she is stable at this point for discharge home.  I will prescribe her a Medrol Dosepak, Atarax for itching, she can continue Benadryl at home.  She verbalized understanding.       Wyvonnia Dusky, MD 01/07/22 [phone]

## 2022-01-07 NOTE — ED Provider Notes (Signed)
[place] EMERGENCY DEPT Provider Note   CSN: [id number] Arrival date & time: 01/07/22  1047     History {Add pertinent medical, surgical, social history, OB history to HPI:1} Chief Complaint  Patient presents with   Urticaria    [name] is a 66 y.o. female.   Urticaria       Home Medications Prior to Admission medications   Medication Sig Start Date End Date Taking? Authorizing Provider  acetaminophen-codeine (TYLENOL #3) 300-30 MG tablet Take 1-2 tablets by mouth every 4 (four) hours as needed for moderate pain. 11/01/21   Felipa Furnace, DPM  amLODipine (NORVASC) 5 MG tablet Take 1 tablet (5 mg total) by mouth daily. 12/30/21   Shelly Coss, MD  amoxicillin-clavulanate (AUGMENTIN) 875-125 MG tablet Take 1 tablet by mouth every 12 (twelve) hours. 01/04/22 02/03/22  Felipa Furnace, DPM  Calcium Carb-Cholecalciferol (CALCIUM-VITAMIN D3) 600-400 MG-UNIT TABS Take 1 tablet by mouth daily.    [provider]  doxycycline (VIBRA-TABS) 100 MG tablet Take 1 tablet (100 mg total) by mouth every 12 (twelve) hours for 10 days. 12/29/21 01/08/22  Shelly Coss, MD  EPINEPHrine 0.3 mg/0.3 mL IJ SOAJ injection Inject 0.3 mg into the muscle as needed for anaphylaxis. 11/19/20   Horton, Alvin Critchley, DO  gabapentin (NEURONTIN) 100 MG capsule Take 1 capsule (100 mg total) by mouth 3 (three) times daily. 12/21/21   Felipa Furnace, DPM  ibuprofen (ADVIL) 800 MG tablet Take 1 tablet (800 mg total) by mouth every 6 (six) hours as needed. 12/12/21   Felipa Furnace, DPM  metoprolol tartrate 75 MG TABS Take 75 mg by mouth 2 (two) times daily. 12/29/21   Shelly Coss, MD  Multiple Vitamins-Minerals (MULTIVITAMIN PO) Take 1 tablet by mouth daily.    [provider]  omeprazole (PRILOSEC) 40 MG capsule Take 40 mg by mouth daily. 10/14/15   [provider]  oxyCODONE-acetaminophen (PERCOCET) 10-325 MG tablet Take 1 tablet by mouth every 4 (four) hours as needed  for pain. 12/21/21   Felipa Furnace, DPM  oxyCODONE-acetaminophen (PERCOCET) 10-325 MG tablet Take 1 tablet by mouth every 4 (four) hours as needed for pain. 12/30/21   Felipa Furnace, DPM  oxyCODONE-acetaminophen (PERCOCET) 10-325 MG tablet Take 1 tablet by mouth every 4 (four) hours as needed for pain. 01/04/22   Felipa Furnace, DPM  potassium chloride SA (KLOR-CON M) 20 MEQ tablet Take 2 tablets (40 mEq total) by mouth daily for 5 days. 12/30/21 01/04/22  Shelly Coss, MD  rivaroxaban (XARELTO) 10 MG TABS tablet Take 1 tablet (10 mg total) by mouth daily. 12/14/21   Felipa Furnace, DPM  venlafaxine XR (EFFEXOR-XR) 75 MG 24 hr capsule Take 1 capsule (75 mg total) by mouth daily with breakfast. 12/30/21   Shelly Coss, MD      Allergies    Atenolol, Lisinopril, Paxil [paroxetine hcl], Zoloft [sertraline hcl], and Elemental sulfur    Review of Systems   Review of Systems  Physical Exam Updated Vital Signs BP 124/73 (BP Location: Right Arm)   Pulse 88   Temp 98.4 F (36.9 C) (Oral)   Resp 16   Ht '5\' 4"'$  (1.626 m)   Wt 98.4 kg   SpO2 99%   BMI 37.25 kg/m  Physical Exam  ED Results / Procedures / Treatments   Labs (all labs ordered are listed, but only abnormal results are displayed) Labs Reviewed - No data to display  EKG None  Radiology  No results found.  Procedures Procedures  {Document cardiac monitor, telemetry assessment procedure when appropriate:1}  Medications Ordered in ED Medications - No data to display  ED Course/ Medical Decision Making/ A&P                           Medical Decision Making  ***  {Document critical care time when appropriate:1} {Document review of labs and clinical decision tools ie heart score, Chads2Vasc2 etc:1}  {Document your independent review of radiology images, and any outside records:1} {Document your discussion with family members, caretakers, and with consultants:1} {Document social determinants of health affecting pt's  care:1} {Document your decision making why or why not admission, treatments were needed:1} Final Clinical Impression(s) / ED Diagnoses Final diagnoses:  None    Rx / DC Orders ED Discharge Orders     None

## 2022-01-07 NOTE — ED Notes (Signed)
ED Provider at bedside. 

## 2022-01-07 NOTE — Discharge Instructions (Addendum)
For itching you can also continue to take Zyrtec in the daytime and Benadryl as needed at night.

## 2022-01-07 NOTE — ED Triage Notes (Addendum)
Pt has hives throughout her body. Pt denies any new medications. Pt states that this happens on and off every now and then. Pt states this has occurred before, took benadryl. Pt took '50mg'$  of benadryl at 10a.

## 2022-01-10 NOTE — Progress Notes (Signed)
Subjective:  Patient ID: [name], female    DOB: 14-Feb-1956,  MRN: [id number]  Chief Complaint  Patient presents with   Routine Post Op    POV #2 DOS 12/12/2021 LT RESECTION OF HAGLUNDS W/ACHILLES TENDON REPAIR W/GASTROCNEMIUS RECESSION W/BONE ANCHOR    DOS: 12/12/2021 Procedure: Left Achilles tendon repair with gastrocnemius recession with Haglund's resection  66 y.o. female returns for post-op check.  patient states she is doing okay.  She states she is doing okay.  She is here to go over the wound issues.  She brought part of her VAC supplies with her not all of it.  Review of Systems: Negative except as noted in the HPI. Denies N/V/F/Ch.  Past Medical History:  Diagnosis Date   Asthma    Asthma    Depression    Depression    Depression    Erosive esophagitis    Headaches, cluster    Hepatitis C    HTN (hypertension)    HTN (hypertension)    HTN (hypertension)    Hypokalemia 05/29/2020    Current Outpatient Medications:    oxyCODONE-acetaminophen (PERCOCET) 10-325 MG tablet, Take 1 tablet by mouth every 4 (four) hours as needed for pain., Disp: 30 tablet, Rfl: 0   acetaminophen-codeine (TYLENOL #3) 300-30 MG tablet, Take 1-2 tablets by mouth every 4 (four) hours as needed for moderate pain., Disp: 30 tablet, Rfl: 0   amLODipine (NORVASC) 5 MG tablet, Take 1 tablet (5 mg total) by mouth daily., Disp: 30 tablet, Rfl: 0   amoxicillin-clavulanate (AUGMENTIN) 875-125 MG tablet, Take 1 tablet by mouth every 12 (twelve) hours., Disp: 60 tablet, Rfl: 0   Calcium Carb-Cholecalciferol (CALCIUM-VITAMIN D3) 600-400 MG-UNIT TABS, Take 1 tablet by mouth daily., Disp: , Rfl:    EPINEPHrine 0.3 mg/0.3 mL IJ SOAJ injection, Inject 0.3 mg into the muscle as needed for anaphylaxis., Disp: 1 each, Rfl: 0   EPINEPHrine 0.3 mg/0.3 mL IJ SOAJ injection, Inject 0.3 mg into the muscle as needed for anaphylaxis., Disp: 1 each, Rfl: 2   gabapentin (NEURONTIN) 100 MG capsule, Take 1 capsule (100  mg total) by mouth 3 (three) times daily., Disp: 90 capsule, Rfl: 3   hydrOXYzine (ATARAX) 25 MG tablet, Take 1 tablet (25 mg total) by mouth every 6 (six) hours as needed for up to 21 doses for itching., Disp: 21 tablet, Rfl: 0   ibuprofen (ADVIL) 800 MG tablet, Take 1 tablet (800 mg total) by mouth every 6 (six) hours as needed., Disp: 60 tablet, Rfl: 1   methylPREDNISolone (MEDROL DOSEPAK) 4 MG TBPK tablet, Use as directed on package, Disp: 21 tablet, Rfl: 0   metoprolol tartrate 75 MG TABS, Take 75 mg by mouth 2 (two) times daily., Disp: 60 tablet, Rfl: 0   Multiple Vitamins-Minerals (MULTIVITAMIN PO), Take 1 tablet by mouth daily., Disp: , Rfl:    omeprazole (PRILOSEC) 40 MG capsule, Take 40 mg by mouth daily., Disp: , Rfl: 10   oxyCODONE-acetaminophen (PERCOCET) 10-325 MG tablet, Take 1 tablet by mouth every 4 (four) hours as needed for pain., Disp: 30 tablet, Rfl: 0   oxyCODONE-acetaminophen (PERCOCET) 10-325 MG tablet, Take 1 tablet by mouth every 4 (four) hours as needed for pain., Disp: 30 tablet, Rfl: 0   potassium chloride SA (KLOR-CON M) 20 MEQ tablet, Take 2 tablets (40 mEq total) by mouth daily for 5 days., Disp: 10 tablet, Rfl: 0   rivaroxaban (XARELTO) 10 MG TABS tablet, Take 1 tablet (10 mg total) by mouth  daily., Disp: 30 tablet, Rfl: 0   venlafaxine XR (EFFEXOR-XR) 75 MG 24 hr capsule, Take 1 capsule (75 mg total) by mouth daily with breakfast., Disp: 30 capsule, Rfl: 0  Social History   Tobacco Use  Smoking Status Never  Smokeless Tobacco Never    Allergies  Allergen Reactions   Atenolol     FATIGUE/ HYPOTENSION   Lisinopril     COUGH/ WHEEZING   Paxil [Paroxetine Hcl]     INCREASED APPETITE FOR SWEETS   Zoloft [Sertraline Hcl]     TINNITUS   Elemental Sulfur Rash    FEVER   Objective:  There were no vitals filed for this visit. There is no height or weight on file to calculate BMI. Constitutional Well developed. Well nourished.  Vascular Foot warm and well  perfused. Capillary refill normal to all digits.   Neurologic Normal speech. Oriented to person, place, and time. Epicritic sensation to light touch grossly present bilaterally.  Dermatologic Wound dehiscence noted at the incision site of the Achilles.  The measurements are below.  No exposure of tendon noted.  No complication noted.  Manual muscle strength is intact 4 out of 5.  Plantar flexion of the Achilles tendon noted.  Good range of motion noted of the ankle joint active and passive  Orthopedic: Tenderness to palpation noted about the surgical site.   Radiographs: 3 views of skeletally mature adult left foot: Resection of Haglund's noted  Assessment:   1. Wound dehiscence   2. Status post foot surgery     Plan:  Patient was evaluated and treated and all questions answered.  S/p foot surgery left -Progressing as expected post-operatively. -XR: See above -WB Status: Nonweightbearing in left lower extremity -Sutures: None -Medications: Augmentin and Percocet -Patient will do Betadine wet-to-dry dressing change daily.  Patient will put the [facility] back on on Friday as she forgot to bring the black sponge.  Home care nursing Monday Wednesday Friday for wound VAC changes -I will keep her on antibiotics until resolve meant -The wound measurement is 10 cm x 2 cm x 0.2 cm  No follow-ups on file.

## 2022-01-11 ENCOUNTER — Telehealth: Payer: Self-pay | Admitting: *Deleted

## 2022-01-11 NOTE — Telephone Encounter (Signed)
-----   Message from Shirley Muscat, [place] sent at 01/09/2022 11:31 AM EDT ----- Regarding: FW: [facility] wound VAC  ----- Message ----- From: Felipa Furnace, DPM Sent: 01/06/2022  11:46 AM EDT To: Shirley Muscat, CMA Subject: [facility] wound VAC                         Hi Caryl Pina,  Can you order [facility] wound VAC.  Just like we discussed.  Her insurance would not cover the [facility]

## 2022-01-11 NOTE — Telephone Encounter (Signed)
Faxed order to [facility] on 01/09/22,confirmation received.

## 2022-01-13 ENCOUNTER — Ambulatory Visit (INDEPENDENT_AMBULATORY_CARE_PROVIDER_SITE_OTHER): Payer: PPO | Admitting: Podiatry

## 2022-01-13 DIAGNOSIS — Z9889 Other specified postprocedural states: Secondary | ICD-10-CM

## 2022-01-13 DIAGNOSIS — T8130XA Disruption of wound, unspecified, initial encounter: Secondary | ICD-10-CM | POA: Diagnosis not present

## 2022-01-13 MED ORDER — DOXYCYCLINE HYCLATE 100 MG PO TABS: 100.0000 mg | ORAL_TABLET | Freq: Two times a day (BID) | ORAL | 0 refills | Status: AC

## 2022-01-13 NOTE — Progress Notes (Unsigned)
Subjective:  Patient ID: [name], female    DOB: February 03, 1956,  MRN: [id number]  Chief Complaint  Patient presents with   Routine Post Op    DOS: 12/12/2021 Procedure: Left Achilles tendon repair with gastrocnemius recession with Haglund's resection  66 y.o. female returns for post-op check.  patient states she is doing okay.  She states she is doing okay.  She is here to go over the wound issues.  She has a wound VAC supplies.  Review of Systems: Negative except as noted in the HPI. Denies N/V/F/Ch.  Past Medical History:  Diagnosis Date   Asthma    Asthma    Depression    Depression    Depression    Erosive esophagitis    Headaches, cluster    Hepatitis C    HTN (hypertension)    HTN (hypertension)    HTN (hypertension)    Hypokalemia 05/29/2020    Current Outpatient Medications:    doxycycline (VIBRA-TABS) 100 MG tablet, Take 1 tablet (100 mg total) by mouth 2 (two) times daily., Disp: 60 tablet, Rfl: 0   acetaminophen-codeine (TYLENOL #3) 300-30 MG tablet, Take 1-2 tablets by mouth every 4 (four) hours as needed for moderate pain., Disp: 30 tablet, Rfl: 0   amLODipine (NORVASC) 5 MG tablet, Take 1 tablet (5 mg total) by mouth daily., Disp: 30 tablet, Rfl: 0   amoxicillin-clavulanate (AUGMENTIN) 875-125 MG tablet, Take 1 tablet by mouth every 12 (twelve) hours., Disp: 60 tablet, Rfl: 0   Calcium Carb-Cholecalciferol (CALCIUM-VITAMIN D3) 600-400 MG-UNIT TABS, Take 1 tablet by mouth daily., Disp: , Rfl:    EPINEPHrine 0.3 mg/0.3 mL IJ SOAJ injection, Inject 0.3 mg into the muscle as needed for anaphylaxis., Disp: 1 each, Rfl: 0   EPINEPHrine 0.3 mg/0.3 mL IJ SOAJ injection, Inject 0.3 mg into the muscle as needed for anaphylaxis., Disp: 1 each, Rfl: 2   gabapentin (NEURONTIN) 100 MG capsule, Take 1 capsule (100 mg total) by mouth 3 (three) times daily., Disp: 90 capsule, Rfl: 3   hydrOXYzine (ATARAX) 25 MG tablet, Take 1 tablet (25 mg total) by mouth every 6 (six) hours  as needed for up to 21 doses for itching., Disp: 21 tablet, Rfl: 0   ibuprofen (ADVIL) 800 MG tablet, Take 1 tablet (800 mg total) by mouth every 6 (six) hours as needed., Disp: 60 tablet, Rfl: 1   methylPREDNISolone (MEDROL DOSEPAK) 4 MG TBPK tablet, Use as directed on package, Disp: 21 tablet, Rfl: 0   metoprolol tartrate 75 MG TABS, Take 75 mg by mouth 2 (two) times daily., Disp: 60 tablet, Rfl: 0   Multiple Vitamins-Minerals (MULTIVITAMIN PO), Take 1 tablet by mouth daily., Disp: , Rfl:    omeprazole (PRILOSEC) 40 MG capsule, Take 40 mg by mouth daily., Disp: , Rfl: 10   oxyCODONE-acetaminophen (PERCOCET) 10-325 MG tablet, Take 1 tablet by mouth every 4 (four) hours as needed for pain., Disp: 30 tablet, Rfl: 0   oxyCODONE-acetaminophen (PERCOCET) 10-325 MG tablet, Take 1 tablet by mouth every 4 (four) hours as needed for pain., Disp: 30 tablet, Rfl: 0   oxyCODONE-acetaminophen (PERCOCET) 10-325 MG tablet, Take 1 tablet by mouth every 4 (four) hours as needed for pain., Disp: 30 tablet, Rfl: 0   oxyCODONE-acetaminophen (PERCOCET) 10-325 MG tablet, Take 1 tablet by mouth every 4 (four) hours as needed for pain., Disp: 30 tablet, Rfl: 0   potassium chloride SA (KLOR-CON M) 20 MEQ tablet, Take 2 tablets (40 mEq total) by mouth daily  for 5 days., Disp: 10 tablet, Rfl: 0   rivaroxaban (XARELTO) 10 MG TABS tablet, Take 1 tablet (10 mg total) by mouth daily., Disp: 30 tablet, Rfl: 0   venlafaxine XR (EFFEXOR-XR) 75 MG 24 hr capsule, Take 1 capsule (75 mg total) by mouth daily with breakfast., Disp: 30 capsule, Rfl: 0  Social History   Tobacco Use  Smoking Status Never  Smokeless Tobacco Never    Allergies  Allergen Reactions   Atenolol     FATIGUE/ HYPOTENSION   Lisinopril     COUGH/ WHEEZING   Paxil [Paroxetine Hcl]     INCREASED APPETITE FOR SWEETS   Zoloft [Sertraline Hcl]     TINNITUS   Elemental Sulfur Rash    FEVER   Objective:  There were no vitals filed for this visit. There  is no height or weight on file to calculate BMI. Constitutional Well developed. Well nourished.  Vascular Foot warm and well perfused. Capillary refill normal to all digits.   Neurologic Normal speech. Oriented to person, place, and time. Epicritic sensation to light touch grossly present bilaterally.  Dermatologic Wound dehiscence noted at the incision site of the Achilles.  The measurements are below.  No exposure of tendon noted.  No complication noted.  Manual muscle strength is intact 4 out of 5.  Plantar flexion of the Achilles tendon noted.  Good range of motion noted of the ankle joint active and passive  Orthopedic: Tenderness to palpation noted about the surgical site.   Radiographs: 3 views of skeletally mature adult left foot: Resection of Haglund's noted  Assessment:   1. Wound dehiscence   2. Status post foot surgery      Plan:  Patient was evaluated and treated and all questions answered.  S/p foot surgery left -Progressing as expected post-operatively. -XR: See above -WB Status: Nonweightbearing in left lower extremity -Sutures: None -Medications: Augmentin and Percocet -VAC was reapplied and is functioning well.  There is improvement in granulation tissue noted -I will keep her on antibiotics until resolve meant -The wound measurement is 10 cm x 2 cm x 0.2 cm  No follow-ups on file.

## 2022-01-14 ENCOUNTER — Other Ambulatory Visit: Payer: Self-pay | Admitting: Podiatry

## 2022-01-14 MED ORDER — OXYCODONE-ACETAMINOPHEN 10-325 MG PO TABS: 1.0000 | ORAL_TABLET | ORAL | 0 refills | Status: DC | PRN

## 2022-01-18 ENCOUNTER — Telehealth: Payer: Self-pay

## 2022-01-18 NOTE — Telephone Encounter (Signed)
No further evaluation is needed. 

## 2022-01-20 ENCOUNTER — Telehealth: Payer: Self-pay | Admitting: Podiatry

## 2022-01-20 MED ORDER — OXYCODONE-ACETAMINOPHEN 10-325 MG PO TABS: 1.0000 | ORAL_TABLET | ORAL | 0 refills | Status: DC | PRN

## 2022-01-20 NOTE — Telephone Encounter (Signed)
Pharmacy :  [facility] [address]    Medication : oxyCODONE-acetaminophen (PERCOCET) 10-325 MG tablet

## 2022-01-20 NOTE — Telephone Encounter (Signed)
Patient notified

## 2022-01-23 DIAGNOSIS — B1920 Unspecified viral hepatitis C without hepatic coma: Secondary | ICD-10-CM | POA: Diagnosis not present

## 2022-01-23 DIAGNOSIS — Z792 Long term (current) use of antibiotics: Secondary | ICD-10-CM | POA: Diagnosis not present

## 2022-01-23 DIAGNOSIS — K209 Esophagitis, unspecified without bleeding: Secondary | ICD-10-CM | POA: Diagnosis not present

## 2022-01-23 DIAGNOSIS — T8149XD Infection following a procedure, other surgical site, subsequent encounter: Secondary | ICD-10-CM | POA: Diagnosis not present

## 2022-01-23 DIAGNOSIS — I10 Essential (primary) hypertension: Secondary | ICD-10-CM | POA: Diagnosis not present

## 2022-01-23 DIAGNOSIS — Z7901 Long term (current) use of anticoagulants: Secondary | ICD-10-CM | POA: Diagnosis not present

## 2022-01-23 DIAGNOSIS — E669 Obesity, unspecified: Secondary | ICD-10-CM | POA: Diagnosis not present

## 2022-01-23 DIAGNOSIS — J45909 Unspecified asthma, uncomplicated: Secondary | ICD-10-CM | POA: Diagnosis not present

## 2022-01-23 DIAGNOSIS — Z6837 Body mass index (BMI) 37.0-37.9, adult: Secondary | ICD-10-CM | POA: Diagnosis not present

## 2022-01-23 DIAGNOSIS — F32A Depression, unspecified: Secondary | ICD-10-CM | POA: Diagnosis not present

## 2022-01-27 ENCOUNTER — Telehealth: Payer: Self-pay | Admitting: Podiatry

## 2022-01-27 DIAGNOSIS — T8189XA Other complications of procedures, not elsewhere classified, initial encounter: Secondary | ICD-10-CM | POA: Diagnosis not present

## 2022-01-27 MED ORDER — OXYCODONE-ACETAMINOPHEN 10-325 MG PO TABS: 1.0000 | ORAL_TABLET | ORAL | 0 refills | Status: DC | PRN

## 2022-01-27 NOTE — Telephone Encounter (Signed)
Patient is requesting a refill on oxyCODONE-acetaminophen (PERCOCET) 10-325 MG tablet  Please advise

## 2022-01-27 NOTE — Telephone Encounter (Signed)
Please advise 

## 2022-01-27 NOTE — Telephone Encounter (Signed)
Patient notified

## 2022-01-31 DIAGNOSIS — T8131XA Disruption of external operation (surgical) wound, not elsewhere classified, initial encounter: Secondary | ICD-10-CM | POA: Diagnosis not present

## 2022-01-31 DIAGNOSIS — S91002A Unspecified open wound, left ankle, initial encounter: Secondary | ICD-10-CM | POA: Diagnosis not present

## 2022-02-03 ENCOUNTER — Ambulatory Visit (INDEPENDENT_AMBULATORY_CARE_PROVIDER_SITE_OTHER): Payer: PPO | Admitting: Podiatry

## 2022-02-03 DIAGNOSIS — T8130XA Disruption of wound, unspecified, initial encounter: Secondary | ICD-10-CM

## 2022-02-03 MED ORDER — DOXYCYCLINE HYCLATE 100 MG PO TABS: 100.0000 mg | ORAL_TABLET | Freq: Two times a day (BID) | ORAL | 0 refills | Status: DC

## 2022-02-03 MED ORDER — OXYCODONE-ACETAMINOPHEN 5-325 MG PO TABS: 1.0000 | ORAL_TABLET | ORAL | 0 refills | Status: DC | PRN

## 2022-02-03 MED ORDER — AMOXICILLIN-POT CLAVULANATE 875-125 MG PO TABS: 1.0000 | ORAL_TABLET | Freq: Two times a day (BID) | ORAL | 0 refills | Status: DC

## 2022-02-03 NOTE — Progress Notes (Unsigned)
Subjective:  Patient ID: [name], female    DOB: 02-15-1956,  MRN: [id number]  Chief Complaint  Patient presents with   Wound Check    DOS: 12/12/2021 Procedure: Left Achilles tendon repair with gastrocnemius recession with Haglund's resection  66 y.o. female returns for post-op check.  patient states she is doing okay.  She states she is doing okay.  She is here to go over the wound issues.  She has a wound VAC supplies.  Review of Systems: Negative except as noted in the HPI. Denies N/V/F/Ch.  Past Medical History:  Diagnosis Date   Asthma    Asthma    Depression    Depression    Depression    Erosive esophagitis    Headaches, cluster    Hepatitis C    HTN (hypertension)    HTN (hypertension)    HTN (hypertension)    Hypokalemia 05/29/2020    Current Outpatient Medications:    amoxicillin-clavulanate (AUGMENTIN) 875-125 MG tablet, Take 1 tablet by mouth 2 (two) times daily., Disp: 20 tablet, Rfl: 0   amLODipine (NORVASC) 5 MG tablet, Take 1 tablet (5 mg total) by mouth daily., Disp: 30 tablet, Rfl: 0   Calcium Carb-Cholecalciferol (CALCIUM-VITAMIN D3) 600-400 MG-UNIT TABS, Take 1 tablet by mouth daily., Disp: , Rfl:    doxycycline (VIBRA-TABS) 100 MG tablet, Take 1 tablet (100 mg total) by mouth 2 (two) times daily., Disp: 60 tablet, Rfl: 0   EPINEPHrine 0.3 mg/0.3 mL IJ SOAJ injection, Inject 0.3 mg into the muscle as needed for anaphylaxis., Disp: 1 each, Rfl: 2   furosemide (LASIX) 20 MG tablet, Take one tablet by mouth daily for the next 3 days then take daily as needed thereafter for lower extremity swelling, Disp: 30 tablet, Rfl: 0   gabapentin (NEURONTIN) 100 MG capsule, Take 1 capsule (100 mg total) by mouth 3 (three) times daily., Disp: 90 capsule, Rfl: 3   hydrOXYzine (ATARAX) 25 MG tablet, Take 1 tablet (25 mg total) by mouth every 6 (six) hours as needed for up to 21 doses for itching., Disp: 21 tablet, Rfl: 0   ibuprofen (ADVIL) 800 MG tablet, Take 1  tablet (800 mg total) by mouth every 6 (six) hours as needed., Disp: 60 tablet, Rfl: 1   losartan (COZAAR) 25 MG tablet, Take 1 tablet (25 mg total) by mouth daily., Disp: 90 tablet, Rfl: 3   metoprolol tartrate 75 MG TABS, Take 75 mg by mouth 2 (two) times daily., Disp: 60 tablet, Rfl: 0   Multiple Vitamins-Minerals (MULTIVITAMIN PO), Take 1 tablet by mouth daily., Disp: , Rfl:    omeprazole (PRILOSEC) 40 MG capsule, Take 40 mg by mouth daily., Disp: , Rfl: 10   oxyCODONE-acetaminophen (PERCOCET) 10-325 MG tablet, Take 1 tablet by mouth every 4 (four) hours as needed for pain., Disp: 30 tablet, Rfl: 0   potassium chloride (KLOR-CON) 10 MEQ tablet, Take one tablet by mouth daily for the next 3 days then take daily as needed thereafter for lower extremity swelling, Disp: 30 tablet, Rfl: 0   venlafaxine XR (EFFEXOR-XR) 75 MG 24 hr capsule, Take 1 capsule (75 mg total) by mouth daily with breakfast., Disp: 30 capsule, Rfl: 0  Social History   Tobacco Use  Smoking Status Never  Smokeless Tobacco Never    Allergies  Allergen Reactions   Atenolol     FATIGUE/ HYPOTENSION   Lisinopril     COUGH/ WHEEZING   Paxil [Paroxetine Hcl]     INCREASED APPETITE  FOR SWEETS   Zoloft [Sertraline Hcl]     TINNITUS   Elemental Sulfur Rash    FEVER   Objective:  There were no vitals filed for this visit. There is no height or weight on file to calculate BMI. Constitutional Well developed. Well nourished.  Vascular Foot warm and well perfused. Capillary refill normal to all digits.   Neurologic Normal speech. Oriented to person, place, and time. Epicritic sensation to light touch grossly present bilaterally.  Dermatologic Wound dehiscence noted at the incision site of the Achilles.  The measurements are below.  No exposure of tendon noted.  No complication noted.  Manual muscle strength is intact 4 out of 5.  Plantar flexion of the Achilles tendon noted.  Good range of motion noted of the ankle joint  active and passive  Orthopedic: Tenderness to palpation noted about the surgical site.   Radiographs: 3 views of skeletally mature adult left foot: Resection of Haglund's noted  Assessment:   No diagnosis found.    Plan:  Patient was evaluated and treated and all questions answered.  S/p foot surgery left -Progressing as expected post-operatively. -XR: See above -WB Status: Begin weightbearing as tolerated to the left lower extremity with the boot. -Sutures: None -Medications: Augmentin and Percocet -VAC was reapplied and is functioning well.  There is improvement in granulation tissue noted -I will keep her on antibiotics until resolve meant -The wound measurement is 8 cm x 2 cm x 0.2 cm more granulation tissue noted -I will reach out to vascular for possible consideration of angiogram given the findings of slow healing wound and so for superficial dehiscence of gastroc recession as well.  No follow-ups on file.    Wound size same. Gastroc wound. Continue vac  Reach out to vascular doctor

## 2022-02-06 ENCOUNTER — Telehealth: Payer: Self-pay | Admitting: Podiatry

## 2022-02-06 NOTE — Telephone Encounter (Signed)
Pt states she was given oxycodone 5 mg but her last RX was for 10 mg. She states she is having to double up at times for the pain but dont want to run out too soon. Wants to know if she can be prescribed the 10 mg instead.  Also she wants to know if she can have weight barring on her foot?  [employer] [phone] - [place], [place]

## 2022-02-07 ENCOUNTER — Other Ambulatory Visit: Payer: Self-pay | Admitting: Podiatry

## 2022-02-07 ENCOUNTER — Encounter: Payer: Self-pay | Admitting: Podiatry

## 2022-02-07 DIAGNOSIS — I739 Peripheral vascular disease, unspecified: Secondary | ICD-10-CM

## 2022-02-07 MED ORDER — OXYCODONE-ACETAMINOPHEN 10-325 MG PO TABS: 1.0000 | ORAL_TABLET | ORAL | 0 refills | Status: DC | PRN

## 2022-02-07 NOTE — Addendum Note (Signed)
Addended by: Boneta Lucks on: 02/07/2022 08:20 AM   Modules accepted: Orders

## 2022-02-07 NOTE — Progress Notes (Unsigned)
Office Visit    Patient Name: [name] Date of Encounter: 02/08/2022  PCP:  Lujean Amel, [place]  Cardiologist:  Jenkins Rouge, MD  Advanced Practice Provider:  No care team member to display Electrophysiologist:  None   HPI    [name] is a 66 y.o. female with a past medical history significant for hypertension, hypokalemia, depression, asthma, tachycardia during hospitalization for COVID/bacterial pneumonia presents today for annual follow-up appointment.  During that admission, heart rate initially 190 bpm given Cardizem in route to ED.  Heart rate 150s.  Converted with adenosine.  Discharged on Lopressor 75 twice daily for sinus tachycardia.  She then was at physical therapy and they noted an elevated heart rate.  No chest pain, dyspnea, palpitations, or syncope.  Was treated with antibiotics and amoxicillin for pneumococcal bacteremia.  CT negative for PE.  Left pleural effusion that could not be tapped.  TTE done 06/01/2020 with EF 60 to 65%, no significant valvular disease.  Follow-up chest x-ray 06/09/2020 with persistent left basilar consolidation and effusion unchanged.  EKG was done 06/09/2020 which showed narrow complex tachycardia, rate 150 bpm post adenosine her rate was 102 and she had clear P waves.  History of hepatitis C and asthma.  Non-smoker.  She had a partial knee replacement 04/29/2020 DOAC stopped with negative LE venous duplex and CTA for PE.  She was last seen 01/24/2021 and was doing well at that time.  She was doing some intermittent fasting for weight loss.  She was concerned about some scarring on the left lung from COVID.  Today, she states she recently had surgery back in August on her left foot.  She then had an infection and has a wound VAC in place.  The foot is wrapped with an Ace wrap and she has a boot on.  She is having some lower extremity edema especially in the left leg.  Blood pressure is elevated today  at 152/80.  She brought a log on her phone for me to review and it seems like its been elevated the last several weeks.  She also mentioned that her potassium was low when she was in the hospital and she had to get it replaced via IV.  She was recently started on amlodipine for her blood pressure.  Reports no shortness of breath nor dyspnea on exertion. Reports no chest pain, pressure, or tightness. No orthopnea, PND. Reports no palpitations.    Past Medical History    Past Medical History:  Diagnosis Date   Asthma    Asthma    Depression    Depression    Depression    Erosive esophagitis    Headaches, cluster    Hepatitis C    HTN (hypertension)    HTN (hypertension)    HTN (hypertension)    Hypokalemia 05/29/2020   Past Surgical History:  Procedure Laterality Date   KNEE SURGERY  04/2020    Allergies  Allergies  Allergen Reactions   Atenolol     FATIGUE/ HYPOTENSION   Lisinopril     COUGH/ WHEEZING   Paxil [Paroxetine Hcl]     INCREASED APPETITE FOR SWEETS   Zoloft [Sertraline Hcl]     TINNITUS   Elemental Sulfur Rash    FEVER     EKGs/Labs/Other Studies Reviewed:   The following studies were reviewed today:  Echocardiogram 06/01/2020 IMPRESSIONS     1. Left ventricular ejection fraction, by estimation, is 60  to 65%. The  left ventricle has normal function. The left ventricle has no regional  wall motion abnormalities. Left ventricular diastolic parameters are  consistent with Grade I diastolic  dysfunction (impaired relaxation). Elevated left atrial pressure.   2. Right ventricular systolic function is normal. The right ventricular  size is normal.   3. The mitral valve is normal in structure. No evidence of mitral valve  regurgitation. No evidence of mitral stenosis.   4. The aortic valve is tricuspid. Aortic valve regurgitation is not  visualized. Mild aortic valve sclerosis is present, with no evidence of  aortic valve stenosis.   FINDINGS   Left  Ventricle: Left ventricular ejection fraction, by estimation, is 60  to 65%. The left ventricle has normal function. The left ventricle has no  regional wall motion abnormalities. Definity contrast agent was given IV  to delineate the left ventricular   endocardial borders. The left ventricular internal cavity size was normal  in size. There is no left ventricular hypertrophy. Left ventricular  diastolic parameters are consistent with Grade I diastolic dysfunction  (impaired relaxation). Elevated left  atrial pressure.   Right Ventricle: The right ventricular size is normal.Right ventricular  systolic function is normal.   Left Atrium: Left atrial size was normal in size.   Right Atrium: Right atrial size was normal in size.   Pericardium: There is no evidence of pericardial effusion.   Mitral Valve: The mitral valve is normal in structure. No evidence of  mitral valve stenosis.   Tricuspid Valve: The tricuspid valve is normal in structure. Tricuspid  valve regurgitation is trivial. No evidence of tricuspid stenosis.   Aortic Valve: The aortic valve is tricuspid. Aortic valve regurgitation is  not visualized. Mild aortic valve sclerosis is present, with no evidence  of aortic valve stenosis.   Pulmonic Valve: The pulmonic valve was not well visualized. Pulmonic valve  regurgitation is not visualized. No evidence of pulmonic stenosis.   Aorta: The aortic root is normal in size and structure.   EKG:  EKG is not ordered today.    Recent Labs: 12/28/2021: Hemoglobin 13.3; Magnesium 1.9; Platelets 290 12/29/2021: BUN 17; Creatinine, Ser 0.80; Potassium 3.3; Sodium 145  Recent Lipid Panel    Component Value Date/Time   TRIG 114 05/29/2020 1232    Home Medications   Current Meds  Medication Sig   amLODipine (NORVASC) 5 MG tablet Take 1 tablet (5 mg total) by mouth daily.   amoxicillin-clavulanate (AUGMENTIN) 875-125 MG tablet Take 1 tablet by mouth 2 (two) times daily.    Calcium Carb-Cholecalciferol (CALCIUM-VITAMIN D3) 600-400 MG-UNIT TABS Take 1 tablet by mouth daily.   doxycycline (VIBRA-TABS) 100 MG tablet Take 1 tablet (100 mg total) by mouth 2 (two) times daily.   EPINEPHrine 0.3 mg/0.3 mL IJ SOAJ injection Inject 0.3 mg into the muscle as needed for anaphylaxis.   gabapentin (NEURONTIN) 100 MG capsule Take 1 capsule (100 mg total) by mouth 3 (three) times daily.   hydrOXYzine (ATARAX) 25 MG tablet Take 1 tablet (25 mg total) by mouth every 6 (six) hours as needed for up to 21 doses for itching.   ibuprofen (ADVIL) 800 MG tablet Take 1 tablet (800 mg total) by mouth every 6 (six) hours as needed.   metoprolol tartrate 75 MG TABS Take 75 mg by mouth 2 (two) times daily.   Multiple Vitamins-Minerals (MULTIVITAMIN PO) Take 1 tablet by mouth daily.   omeprazole (PRILOSEC) 40 MG capsule Take 40 mg by mouth daily.  oxyCODONE-acetaminophen (PERCOCET) 10-325 MG tablet Take 1 tablet by mouth every 4 (four) hours as needed for pain.   venlafaxine XR (EFFEXOR-XR) 75 MG 24 hr capsule Take 1 capsule (75 mg total) by mouth daily with breakfast.     Review of Systems      All other systems reviewed and are otherwise negative except as noted above.  Physical Exam    VS:  BP (!) 152/80   Pulse 80   Ht 5' (1.524 m)   Wt 219 lb 9.6 oz (99.6 kg)   SpO2 97%   BMI 42.89 kg/m  , BMI Body mass index is 42.89 kg/m.  Wt Readings from Last 3 Encounters:  02/08/22 219 lb 9.6 oz (99.6 kg)  01/07/22 217 lb (98.4 kg)  12/25/21 226 lb 12.8 oz (102.9 kg)     GEN: Well nourished, well developed, in no acute distress. HEENT: normal. Neck: Supple, no JVD, carotid bruits, or masses. Cardiac: RRR, no murmurs, rubs, or gallops. No clubbing, cyanosis, 1-2 + non pitting LE edema L > R.  Radials/PT 2+ and equal bilaterally.  Respiratory:  Respirations regular and unlabored, clear to auscultation bilaterally. GI: Soft, nontender, nondistended. MS: No deformity or atrophy. Skin:  Warm and dry, no rash. Neuro:  Strength and sensation are intact. Psych: Normal affect.  Assessment & Plan    SVT in the setting of meningococcal pneumonia and COVID -stable on metoprolol 75 mg BID, continue -NSR in the 80s today  Lower extremity edema L > R -Recent left foot surgery with infection and wound VAC in place.  She has noticed an increase in lower extremity edema especially on the left side -We have ordered 20 mg of Lasix and potassium replacement 10 mEq x 3 days.  As needed thereafter. -Her potassium was low when she was in the hospital.  We will recheck a BMP today. -Follow-up BMP in 2 weeks.  Hypertension -Elevated in the clinic.  Reportedly has been 456-256 systolic over 90 diastolic at home -Recently started on amlodipine 5 mg daily when she was in the hospital about a month ago -Would hold off on amlodipine titration due to lower extremity edema -We have added losartan 25 mg daily -Please log your blood pressure an hour after morning medicines x2 weeks and send me those values  CAD -no chest pain or SOB -Continue current medication regimen    Disposition: Follow up 4 weeks with Jenkins Rouge, MD or APP.  Signed, Elgie Collard, PA-C 02/08/2022, 3:21 PM [facility]

## 2022-02-08 ENCOUNTER — Telehealth: Payer: Self-pay | Admitting: *Deleted

## 2022-02-08 ENCOUNTER — Encounter: Payer: Self-pay | Admitting: Physician Assistant

## 2022-02-08 ENCOUNTER — Ambulatory Visit: Payer: PPO | Attending: Physician Assistant | Admitting: Physician Assistant

## 2022-02-08 VITALS — BP 152/80 | HR 80 | Ht 60.0 in | Wt 219.6 lb

## 2022-02-08 DIAGNOSIS — I2510 Atherosclerotic heart disease of native coronary artery without angina pectoris: Secondary | ICD-10-CM

## 2022-02-08 DIAGNOSIS — T783XXD Angioneurotic edema, subsequent encounter: Secondary | ICD-10-CM | POA: Diagnosis not present

## 2022-02-08 DIAGNOSIS — I10 Essential (primary) hypertension: Secondary | ICD-10-CM | POA: Diagnosis not present

## 2022-02-08 DIAGNOSIS — I471 Supraventricular tachycardia, unspecified: Secondary | ICD-10-CM

## 2022-02-08 MED ORDER — FUROSEMIDE 20 MG PO TABS: ORAL_TABLET | ORAL | 0 refills | Status: AC

## 2022-02-08 MED ORDER — LOSARTAN POTASSIUM 25 MG PO TABS: 25.0000 mg | ORAL_TABLET | Freq: Every day | ORAL | 3 refills | Status: DC

## 2022-02-08 MED ORDER — POTASSIUM CHLORIDE ER 10 MEQ PO TBCR: EXTENDED_RELEASE_TABLET | ORAL | 0 refills | Status: AC

## 2022-02-08 NOTE — Telephone Encounter (Signed)
Nurse with in home [place] [phone] requesting weight bearing status for patient Returned the call back to Maudie Mercury, giving physician's message from [place] that patient can put pressure on the foot, verbalized understanding.

## 2022-02-08 NOTE — Patient Instructions (Signed)
Medication Instructions:  1.Start lasix 20 mg daily for the next 3 days, then as needed for lower extremity swelling thereafter  2.Start potassium 20 meq daily for the next 3 days, then as needed when you take the lasix thereafter 3.Start losartan (Cozaar) 25 mg daily *If you need a refill on your cardiac medications before your next appointment, please call your pharmacy*   Lab Work: BMP today BMP in 2 weeks If you have labs (blood work) drawn today and your tests are completely normal, you will receive your results only by: [place] (if you have MyChart) OR A paper copy in the mail If you have any lab test that is abnormal or we need to change your treatment, we will call you to review the results.   Follow-Up: At [facility], you and your health needs are our priority.  As part of our continuing mission to provide you with exceptional heart care, we have created designated Provider Care Teams.  These Care Teams include your primary Cardiologist (physician) and Advanced Practice Providers (APPs -  Physician Assistants and Nurse Practitioners) who all work together to provide you with the care you need, when you need it.   Your next appointment:   1 month(s)  The format for your next appointment:   In Person  Provider:   Jenkins Rouge, MD  or APP  Other Instructions 1.Weigh yourself every morning after using the restroom, before breakfast and keep a log. You can send the readings through MyChart or call. 2.Check your blood pressure daily for the next 2 weeks, one hour after you take your morning medications and keep a log. You can send the readings through MyChart or call.  Important Information About Sugar

## 2022-02-09 LAB — BASIC METABOLIC PANEL
BUN/Creatinine Ratio: 26 (ref 12–28)
BUN: 20 mg/dL (ref 8–27)
CO2: 24 mmol/L (ref 20–29)
Calcium: 10.4 mg/dL — ABNORMAL HIGH (ref 8.7–10.3)
Chloride: 100 mmol/L (ref 96–106)
Creatinine, Ser: 0.77 mg/dL (ref 0.57–1.00)
Glucose: 83 mg/dL (ref 70–99)
Potassium: 4.4 mmol/L (ref 3.5–5.2)
Sodium: 141 mmol/L (ref 134–144)
eGFR: 85 mL/min/{1.73_m2} (ref >59)

## 2022-02-10 ENCOUNTER — Encounter: Payer: Self-pay | Admitting: *Deleted

## 2022-02-10 ENCOUNTER — Ambulatory Visit
Admission: RE | Admit: 2022-02-10 | Discharge: 2022-02-10 | Disposition: A | Discharge status: Home / Self Care | Payer: PPO | Source: Ambulatory Visit | Attending: Podiatry | Admitting: Podiatry

## 2022-02-10 DIAGNOSIS — M1712 Unilateral primary osteoarthritis, left knee: Secondary | ICD-10-CM | POA: Diagnosis not present

## 2022-02-10 DIAGNOSIS — L97429 Non-pressure chronic ulcer of left heel and midfoot with unspecified severity: Secondary | ICD-10-CM | POA: Diagnosis not present

## 2022-02-10 DIAGNOSIS — I739 Peripheral vascular disease, unspecified: Secondary | ICD-10-CM

## 2022-02-10 HISTORY — PX: IR RADIOLOGIST EVAL & MGMT: IMG5224

## 2022-02-10 NOTE — Consult Note (Signed)
Chief Complaint: Left heel wound  Referring Physician(s): Patel,Kevin P  PCP: Lujean Amel, MD [facility]  History of Present Illness: [name] is a 66 y.o. female presenting today as a scheduled appointment to [facility], kindly referred by Dr. Posey Pronto, for evaluation of slow healing wound.   Ms Wilhelmi joins [place] today by telemedicine visit with her sister in law [place]. We confirmed her identity with 2 personal identifiers  She tells me that she had surgery August 21 on her left achilles, and that she was then treated for an infection.  Since then she has had some difficulty healing.  Currently she is receiving ABX, and has home health 3x's weekly to help.   I cannot elicit any history previously of claudication.  I cannot elicit any prior history of wound healing problems of the lower extremity.   She denies any prior MI or stroke.  She is currently being treated by cardiology for paroxysmal tachycardia.    She tells me that she was taken off of her diuretic recently, that resulted in some lower extremity edema.   This was because of hypokalemia.  She has restarted diuretics with lasix now and has seen visible improvement in the swelling.    She denies history of DVT/PE.  She states her brother has had "a couple of episodes" of DVT.   She is a never smoker.  No diabetes. No history of lipid problems.  CV risk factors: HTN.  Noninvsasive 12/27/21 Right ABI: 1.04 Left ABI: 1.11 Multiphasic waveforms on the segmental at the ankle.  No prior cross-sectional imaging  Past Medical History:  Diagnosis Date   Asthma    Asthma    Depression    Depression    Depression    Erosive esophagitis    Headaches, cluster    Hepatitis C    HTN (hypertension)    HTN (hypertension)    HTN (hypertension)    Hypokalemia 05/29/2020    Past Surgical History:  Procedure Laterality Date   KNEE SURGERY  04/2020    Allergies: Atenolol, Lisinopril, Paxil [paroxetine hcl],  Zoloft [sertraline hcl], and Elemental sulfur  Medications: Prior to Admission medications   Medication Sig Start Date End Date Taking? Authorizing Provider  amLODipine (NORVASC) 5 MG tablet Take 1 tablet (5 mg total) by mouth daily. 12/30/21   Shelly Coss, MD  amoxicillin-clavulanate (AUGMENTIN) 875-125 MG tablet Take 1 tablet by mouth 2 (two) times daily. 02/03/22   Felipa Furnace, DPM  Calcium Carb-Cholecalciferol (CALCIUM-VITAMIN D3) 600-400 MG-UNIT TABS Take 1 tablet by mouth daily.    [provider]  doxycycline (VIBRA-TABS) 100 MG tablet Take 1 tablet (100 mg total) by mouth 2 (two) times daily. 01/13/22 02/12/22  Felipa Furnace, DPM  EPINEPHrine 0.3 mg/0.3 mL IJ SOAJ injection Inject 0.3 mg into the muscle as needed for anaphylaxis. 01/07/22   Wyvonnia Dusky, MD  furosemide (LASIX) 20 MG tablet Take one tablet by mouth daily for the next 3 days then take daily as needed thereafter for lower extremity swelling 02/08/22   Elgie Collard, PA-C  gabapentin (NEURONTIN) 100 MG capsule Take 1 capsule (100 mg total) by mouth 3 (three) times daily. 12/21/21   Felipa Furnace, DPM  hydrOXYzine (ATARAX) 25 MG tablet Take 1 tablet (25 mg total) by mouth every 6 (six) hours as needed for up to 21 doses for itching. 01/07/22   Wyvonnia Dusky, MD  ibuprofen (ADVIL) 800 MG tablet Take 1 tablet (  800 mg total) by mouth every 6 (six) hours as needed. 12/12/21   Felipa Furnace, DPM  losartan (COZAAR) 25 MG tablet Take 1 tablet (25 mg total) by mouth daily. 02/08/22   Elgie Collard, PA-C  metoprolol tartrate 75 MG TABS Take 75 mg by mouth 2 (two) times daily. 12/29/21   Shelly Coss, MD  Multiple Vitamins-Minerals (MULTIVITAMIN PO) Take 1 tablet by mouth daily.    [provider]  omeprazole (PRILOSEC) 40 MG capsule Take 40 mg by mouth daily. 10/14/15   [provider]  oxyCODONE-acetaminophen (PERCOCET) 10-325 MG tablet Take 1 tablet by mouth every 4 (four) hours as needed for  pain. 02/07/22   Felipa Furnace, DPM  potassium chloride (KLOR-CON) 10 MEQ tablet Take one tablet by mouth daily for the next 3 days then take daily as needed thereafter for lower extremity swelling 02/08/22   Elgie Collard, PA-C  venlafaxine XR (EFFEXOR-XR) 75 MG 24 hr capsule Take 1 capsule (75 mg total) by mouth daily with breakfast. 12/30/21   Shelly Coss, MD     Family History  Problem Relation Age of Onset   Emphysema Father    Coronary artery disease Mother        CABG   Diabetes Mellitus II Mother    Heart disease Mother    Stroke Brother    Heart attack Sister    Crohn's disease Sister    Liver disease Sister    Heart disease Brother    Colitis Brother    Heart disease Brother    Irritable bowel syndrome Daughter    Bipolar disorder Daughter    Breast cancer Neg Hx     Social History   Socioeconomic History   Marital status: Widowed    Spouse name: Not on file   Number of children: Not on file   Years of education: Not on file   Highest education level: Not on file  Occupational History   Not on file  Tobacco Use   Smoking status: Never   Smokeless tobacco: Never  Substance and Sexual Activity   Alcohol use: Not Currently    Alcohol/week: 0.0 standard drinks of alcohol   Drug use: Not on file   Sexual activity: Not on file  Other Topics Concern   Not on file  Social History Narrative   Not on file   Social Determinants of Health   Financial Resource Strain: Not on file  Food Insecurity: No Food Insecurity (12/28/2021)   Hunger Vital Sign    Worried About Running Out of Food in the Last Year: Never true    Ran Out of Food in the Last Year: Never true  Transportation Needs: No Transportation Needs (12/28/2021)   PRAPARE - Hydrologist (Medical): No    Lack of Transportation (Non-Medical): No  Physical Activity: Not on file  Stress: Not on file  Social Connections: Not on file       Review of Systems  Review of  Systems: A 12 point ROS discussed and pertinent positives are indicated in the HPI above.  All other systems are negative.  Advance Care Plan: The advanced care plan/surrogate decision maker was discussed at the time of visit and documented in the medical record.    Physical Exam No direct physical exam was performed (except for noted visual exam findings with Video Visits).    Vital Signs: There were no vitals taken for this visit.  Imaging: No results  found.  Labs:  CBC: Recent Labs    12/25/21 1949 12/26/21 0321 12/27/21 0323 12/28/21 0330  WBC 9.3 8.3 6.7 7.4  HGB 13.2 12.6 12.9 13.3  HCT 39.7 38.3 38.7 40.5  PLT 300 287 267 290    COAGS: No results for input(s): "INR", "APTT" in the last 8760 hours.  BMP: Recent Labs    12/26/21 0321 12/27/21 0323 12/28/21 0330 12/29/21 0405 02/08/22 1538  NA 144 142 146* 145 141  K 3.0* 2.9* 3.1* 3.3* 4.4  CL 106 104 107 105 100  CO2 '28 28 29 29 24  '$ GLUCOSE 131* 112* 113* 105* 83  BUN '21 20 19 17 20  '$ CALCIUM 9.2 9.4 9.8 9.6 10.4*  CREATININE 0.70 0.60 0.73 0.80 0.77  GFRNONAA >60 >60 >60 >60  --     LIVER FUNCTION TESTS: No results for input(s): "BILITOT", "AST", "ALT", "ALKPHOS", "PROT", "ALBUMIN" in the last 8760 hours.  TUMOR MARKERS: No results for input(s): "AFPTM", "CEA", "CA199", "CHROMGRNA" in the last 8760 hours.  Assessment and Plan:  Assessment:  Ms Belk is a very pleasant 66 yo female presenting with slow healing of a post-surgical wound, left heel.   Non-invasive lower extremity exam shows normal ABI.   I had a discussion with her regarding anatomy, pathology/pathophysiology, natural history, and prognosis of PAD/CLI.  I did let her know that often atherosclerosis can contribute to poor healing, but of course there are other reasons and she does not have multiple risk factors.   We discussed further diagnostics that might be considered, including the gold standard, angiogram, as well as  cross-sectional imaging.  At this point, I think a good CTA is reasonable given the non-invasive result, as I feel there is low likelihood of significant PAD contributing, and she is clearly hesitant about angiogram.   She understands.  I have encouraged her to reach out should she have any problems before we have her back in in follow up.   Plan: - We will proceed with CTA run-off to evaluate lower extremity for any occult arterial or venous problem. - Office visit after CTA is complete to review and check wound healing status - continue current care      ___________________________________________________________________   1Morley Kos MD, et al. 2016 AHA/ACC Guideline on the Management of Patients With Lower Extremity Peripheral Artery Disease: Executive Summary: A Report of the American College of Cardiology/American Heart Association Task Force on Clinical Practice Guidelines. J Am Coll Cardiol. 2017 Mar 21;69(11):1465-1508. doi: 10.1016/j.jacc.2016.11.008.   2 - Norgren L, et al. TASC II Working Group. Inter-society consensus for the management of peripheral arterial disease. Int Tressia Miners. 2007 Jun;26(2):81-157. Review. PubMed PMID: 45625638  3 - Hingorani A, et al. The management of diabetic foot: A clinical practice guideline by the Society for Vascular Surgery in collaboration with the [place] and the Society  for Vascular Medicine. J Vasc Surg. 2016 Feb;63(2 Suppl):3S-21S. doi: 10.1016/j.jvs.2015.10.003. PubMed PMID: 93734287.  4 - Corinna Gab, Saab FA, Luberta Mutter, Grant Ruts, Ewell Poe, Driver VR, [place], Lookstein R, van den Baldemar Lenis, Jaff MR, Guadalupe Dawn, Henao S, AlMahameed A, Katzen B. Digital Subtraction Angiography Prior to an Amputation for Critical Limb Ischemia (CLI): An Expert Recommendation Statement From the CLI Global Society to Optimize Limb Salvage. J Endovasc Ther. 2020 Aug;27(4):540-546. doi: 10.1177/1526602820928590. Epub 2020 May 29.  PMID: 68115726.     Thank you for this interesting consult.  I greatly enjoyed meeting GRACIA SAGGESE and look forward  to participating in their care.  A copy of this report was sent to the requesting provider on this date.  Electronically Signed: Corrie Mckusick 02/10/2022, 4:17 PM   I spent a total of  40 Minutes   in remote  clinical consultation, greater than 50% of which was counseling/coordinating care for left foot wound, slow healing, possible arterial insufficiency possible angiogram.    Visit type: Audio only (telephone). Audio (no video) only due to patient's lack of internet/smartphone capability. Alternative for in-person consultation at [facility], [address] [place], [place], [place]. This visit type was conducted due to national recommendations for restrictions regarding the COVID-19 Pandemic (e.g. social distancing).  This format is felt to be most appropriate for this patient at this time.  All issues noted in this document were discussed and addressed.

## 2022-02-10 NOTE — Telephone Encounter (Signed)
Patient notified,verbalized understanding and stated that she is taking it easy, weight bearing as tolerated.

## 2022-02-14 ENCOUNTER — Telehealth: Payer: Self-pay | Admitting: Podiatry

## 2022-02-14 MED ORDER — DOXYCYCLINE HYCLATE 100 MG PO TABS: 100.0000 mg | ORAL_TABLET | Freq: Two times a day (BID) | ORAL | 0 refills | Status: DC

## 2022-02-14 MED ORDER — AMOXICILLIN-POT CLAVULANATE 875-125 MG PO TABS: 1.0000 | ORAL_TABLET | Freq: Two times a day (BID) | ORAL | 0 refills | Status: DC

## 2022-02-14 MED ORDER — OXYCODONE-ACETAMINOPHEN 10-325 MG PO TABS: 1.0000 | ORAL_TABLET | ORAL | 0 refills | Status: DC | PRN

## 2022-02-14 NOTE — Telephone Encounter (Signed)
Pt called requesting a refill on her 3 medications. Doxycycline, Oxycodone and Augmentin   Please advise

## 2022-02-15 ENCOUNTER — Other Ambulatory Visit: Payer: Self-pay | Admitting: Interventional Radiology

## 2022-02-15 DIAGNOSIS — I739 Peripheral vascular disease, unspecified: Secondary | ICD-10-CM

## 2022-02-16 DIAGNOSIS — I471 Supraventricular tachycardia, unspecified: Secondary | ICD-10-CM | POA: Diagnosis not present

## 2022-02-16 DIAGNOSIS — R7301 Impaired fasting glucose: Secondary | ICD-10-CM | POA: Diagnosis not present

## 2022-02-16 DIAGNOSIS — E876 Hypokalemia: Secondary | ICD-10-CM | POA: Diagnosis not present

## 2022-02-16 DIAGNOSIS — F321 Major depressive disorder, single episode, moderate: Secondary | ICD-10-CM | POA: Diagnosis not present

## 2022-02-16 DIAGNOSIS — Z79899 Other long term (current) drug therapy: Secondary | ICD-10-CM | POA: Diagnosis not present

## 2022-02-16 DIAGNOSIS — I10 Essential (primary) hypertension: Secondary | ICD-10-CM | POA: Diagnosis not present

## 2022-02-16 DIAGNOSIS — Z0001 Encounter for general adult medical examination with abnormal findings: Secondary | ICD-10-CM | POA: Diagnosis not present

## 2022-02-16 DIAGNOSIS — Z23 Encounter for immunization: Secondary | ICD-10-CM | POA: Diagnosis not present

## 2022-02-21 ENCOUNTER — Telehealth: Payer: Self-pay | Admitting: *Deleted

## 2022-02-21 MED ORDER — OXYCODONE-ACETAMINOPHEN 10-325 MG PO TABS: 1.0000 | ORAL_TABLET | ORAL | 0 refills | Status: DC | PRN

## 2022-02-21 NOTE — Telephone Encounter (Signed)
Patient is calling for a medication refill of the percocet-10-325 mg, has a very painful heel. Please advise.

## 2022-02-22 DIAGNOSIS — Z6837 Body mass index (BMI) 37.0-37.9, adult: Secondary | ICD-10-CM | POA: Diagnosis not present

## 2022-02-22 DIAGNOSIS — F32A Depression, unspecified: Secondary | ICD-10-CM | POA: Diagnosis not present

## 2022-02-22 DIAGNOSIS — E669 Obesity, unspecified: Secondary | ICD-10-CM | POA: Diagnosis not present

## 2022-02-22 DIAGNOSIS — J45909 Unspecified asthma, uncomplicated: Secondary | ICD-10-CM | POA: Diagnosis not present

## 2022-02-22 DIAGNOSIS — T8149XD Infection following a procedure, other surgical site, subsequent encounter: Secondary | ICD-10-CM | POA: Diagnosis not present

## 2022-02-22 DIAGNOSIS — B1920 Unspecified viral hepatitis C without hepatic coma: Secondary | ICD-10-CM | POA: Diagnosis not present

## 2022-02-22 DIAGNOSIS — I10 Essential (primary) hypertension: Secondary | ICD-10-CM | POA: Diagnosis not present

## 2022-02-22 DIAGNOSIS — Z792 Long term (current) use of antibiotics: Secondary | ICD-10-CM | POA: Diagnosis not present

## 2022-02-23 ENCOUNTER — Other Ambulatory Visit: Payer: PPO

## 2022-02-24 ENCOUNTER — Telehealth: Payer: Self-pay | Admitting: Podiatry

## 2022-02-24 MED ORDER — AMOXICILLIN-POT CLAVULANATE 875-125 MG PO TABS: 1.0000 | ORAL_TABLET | Freq: Two times a day (BID) | ORAL | 0 refills | Status: DC

## 2022-02-24 NOTE — Telephone Encounter (Signed)
Please advise 

## 2022-02-24 NOTE — Telephone Encounter (Signed)
Pt called and will be out of her augmentin as of Monday could you please send a refill in for her to [facility] on [address]

## 2022-02-27 NOTE — Telephone Encounter (Signed)
Appointment has been scheduled.

## 2022-02-28 DIAGNOSIS — F32A Depression, unspecified: Secondary | ICD-10-CM | POA: Diagnosis not present

## 2022-02-28 DIAGNOSIS — Z792 Long term (current) use of antibiotics: Secondary | ICD-10-CM | POA: Diagnosis not present

## 2022-02-28 DIAGNOSIS — J45909 Unspecified asthma, uncomplicated: Secondary | ICD-10-CM | POA: Diagnosis not present

## 2022-02-28 DIAGNOSIS — E669 Obesity, unspecified: Secondary | ICD-10-CM | POA: Diagnosis not present

## 2022-02-28 DIAGNOSIS — T8149XD Infection following a procedure, other surgical site, subsequent encounter: Secondary | ICD-10-CM | POA: Diagnosis not present

## 2022-02-28 DIAGNOSIS — I10 Essential (primary) hypertension: Secondary | ICD-10-CM | POA: Diagnosis not present

## 2022-02-28 DIAGNOSIS — B1920 Unspecified viral hepatitis C without hepatic coma: Secondary | ICD-10-CM | POA: Diagnosis not present

## 2022-02-28 DIAGNOSIS — Z6837 Body mass index (BMI) 37.0-37.9, adult: Secondary | ICD-10-CM | POA: Diagnosis not present

## 2022-02-28 MED ORDER — OXYCODONE-ACETAMINOPHEN 10-325 MG PO TABS: 1.0000 | ORAL_TABLET | ORAL | 0 refills | Status: DC | PRN

## 2022-02-28 NOTE — Telephone Encounter (Signed)
Pt called in to request a Rx refill on her pain medicine. Please advise

## 2022-02-28 NOTE — Telephone Encounter (Signed)
Please advise 

## 2022-03-02 ENCOUNTER — Telehealth: Payer: Self-pay | Admitting: *Deleted

## 2022-03-02 NOTE — Telephone Encounter (Addendum)
Patient is being seen tomorrow for visit and Melissa w/ 85M is needing updated notes from that visit w/ wound measurements/office notes sent to them, please call:[phone],ext:[phone].

## 2022-03-03 ENCOUNTER — Ambulatory Visit (INDEPENDENT_AMBULATORY_CARE_PROVIDER_SITE_OTHER): Payer: PPO | Admitting: Podiatry

## 2022-03-03 DIAGNOSIS — T8130XA Disruption of wound, unspecified, initial encounter: Secondary | ICD-10-CM

## 2022-03-03 DIAGNOSIS — Z9889 Other specified postprocedural states: Secondary | ICD-10-CM

## 2022-03-03 MED ORDER — SANTYL 250 UNIT/GM EX OINT: 1.0000 | TOPICAL_OINTMENT | Freq: Every day | CUTANEOUS | 0 refills | Status: DC

## 2022-03-03 NOTE — Progress Notes (Signed)
Subjective:  Patient ID: [name], female    DOB: 05-23-1955,  MRN: [id number]  Chief Complaint  Patient presents with   Routine Post Op    DOS: 12/12/2021 Procedure: Left Achilles tendon repair with gastrocnemius recession with Haglund's resection  65 y.o. female returns for post-op check.  patient states she is doing okay.  She states she is doing okay.  She is here to go over the wound issues. VAC can be discontinued.   Review of Systems: Negative except as noted in the HPI. Denies N/V/F/Ch.  Past Medical History:  Diagnosis Date   Asthma    Asthma    Depression    Depression    Depression    Erosive esophagitis    Headaches, cluster    Hepatitis C    HTN (hypertension)    HTN (hypertension)    HTN (hypertension)    Hypokalemia 05/29/2020    Current Outpatient Medications:    collagenase (SANTYL) 250 UNIT/GM ointment, Apply 1 Application topically daily., Disp: 15 g, Rfl: 0   amLODipine (NORVASC) 5 MG tablet, Take 1 tablet (5 mg total) by mouth daily., Disp: 30 tablet, Rfl: 0   amoxicillin-clavulanate (AUGMENTIN) 875-125 MG tablet, Take 1 tablet by mouth 2 (two) times daily., Disp: 20 tablet, Rfl: 0   amoxicillin-clavulanate (AUGMENTIN) 875-125 MG tablet, Take 1 tablet by mouth 2 (two) times daily., Disp: 20 tablet, Rfl: 0   Calcium Carb-Cholecalciferol (CALCIUM-VITAMIN D3) 600-400 MG-UNIT TABS, Take 1 tablet by mouth daily., Disp: , Rfl:    doxycycline (VIBRA-TABS) 100 MG tablet, Take 1 tablet (100 mg total) by mouth 2 (two) times daily., Disp: 60 tablet, Rfl: 0   EPINEPHrine 0.3 mg/0.3 mL IJ SOAJ injection, Inject 0.3 mg into the muscle as needed for anaphylaxis., Disp: 1 each, Rfl: 2   furosemide (LASIX) 20 MG tablet, Take one tablet by mouth daily for the next 3 days then take daily as needed thereafter for lower extremity swelling, Disp: 30 tablet, Rfl: 0   gabapentin (NEURONTIN) 100 MG capsule, Take 1 capsule (100 mg total) by mouth 3 (three) times daily., Disp:  90 capsule, Rfl: 3   hydrOXYzine (ATARAX) 25 MG tablet, Take 1 tablet (25 mg total) by mouth every 6 (six) hours as needed for up to 21 doses for itching., Disp: 21 tablet, Rfl: 0   ibuprofen (ADVIL) 800 MG tablet, Take 1 tablet (800 mg total) by mouth every 6 (six) hours as needed., Disp: 60 tablet, Rfl: 1   losartan (COZAAR) 25 MG tablet, Take 1 tablet (25 mg total) by mouth daily., Disp: 90 tablet, Rfl: 3   metoprolol tartrate 75 MG TABS, Take 75 mg by mouth 2 (two) times daily., Disp: 60 tablet, Rfl: 0   Multiple Vitamins-Minerals (MULTIVITAMIN PO), Take 1 tablet by mouth daily., Disp: , Rfl:    omeprazole (PRILOSEC) 40 MG capsule, Take 40 mg by mouth daily., Disp: , Rfl: 10   oxyCODONE-acetaminophen (PERCOCET) 10-325 MG tablet, Take 1 tablet by mouth every 4 (four) hours as needed for pain., Disp: 30 tablet, Rfl: 0   oxyCODONE-acetaminophen (PERCOCET) 10-325 MG tablet, Take 1 tablet by mouth every 4 (four) hours as needed for pain., Disp: 30 tablet, Rfl: 0   oxyCODONE-acetaminophen (PERCOCET) 10-325 MG tablet, Take 1 tablet by mouth every 4 (four) hours as needed for pain., Disp: 30 tablet, Rfl: 0   potassium chloride (KLOR-CON) 10 MEQ tablet, Take one tablet by mouth daily for the next 3 days then take daily as needed  thereafter for lower extremity swelling, Disp: 30 tablet, Rfl: 0   venlafaxine XR (EFFEXOR-XR) 75 MG 24 hr capsule, Take 1 capsule (75 mg total) by mouth daily with breakfast., Disp: 30 capsule, Rfl: 0  Social History   Tobacco Use  Smoking Status Never  Smokeless Tobacco Never    Allergies  Allergen Reactions   Atenolol     FATIGUE/ HYPOTENSION   Lisinopril     COUGH/ WHEEZING   Paxil [Paroxetine Hcl]     INCREASED APPETITE FOR SWEETS   Zoloft [Sertraline Hcl]     TINNITUS   Elemental Sulfur Rash    FEVER   Objective:  There were no vitals filed for this visit. There is no height or weight on file to calculate BMI. Constitutional Well developed. Well  nourished.  Vascular Foot warm and well perfused. Capillary refill normal to all digits.   Neurologic Normal speech. Oriented to person, place, and time. Epicritic sensation to light touch grossly present bilaterally.  Dermatologic Wound dehiscence noted at the incision site of the Achilles.  The measurements are below.  No exposure of tendon noted.  No complication noted.  Manual muscle strength is intact 4 out of 5.  Plantar flexion of the Achilles tendon noted.  Good range of motion noted of the ankle joint active and passive  Orthopedic: Tenderness to palpation noted about the surgical site.   Radiographs: 3 views of skeletally mature adult left foot: Resection of Haglund's noted  Assessment:   No diagnosis found.    Plan:  Patient was evaluated and treated and all questions answered.  S/p foot surgery left -Progressing as expected post-operatively. -XR: See above -WB Status: Begin weightbearing as tolerated to the left lower extremity with the boot. -Sutures: None -Medications: Augmentin and Percocet -Discountinue VAC. We will do santyl wet to dry dressings daily. -I will keep her on antibiotics until resolve meant -The wound measurement is 4 cm x 1.5 cm x 0.2 cm more granulation tissue noted -We will hold off on vascular as the wound has greatly improved. -  No follow-ups on file.

## 2022-03-06 ENCOUNTER — Other Ambulatory Visit: Payer: Self-pay | Admitting: Physician Assistant

## 2022-03-06 ENCOUNTER — Telehealth: Payer: Self-pay | Admitting: *Deleted

## 2022-03-06 DIAGNOSIS — T8189XA Other complications of procedures, not elsewhere classified, initial encounter: Secondary | ICD-10-CM | POA: Diagnosis not present

## 2022-03-06 NOTE — Telephone Encounter (Signed)
Updated Office notes have been 20M , confirmation received 03/06/22.

## 2022-03-06 NOTE — Telephone Encounter (Signed)
[name] ([facility])is calling for orders from last office visit notes to be faxed to :[phone]. Faxed 03/06/22 , confirmation received.

## 2022-03-07 ENCOUNTER — Other Ambulatory Visit: Payer: Self-pay | Admitting: Podiatry

## 2022-03-07 ENCOUNTER — Telehealth: Payer: Self-pay | Admitting: Podiatry

## 2022-03-07 MED ORDER — OXYCODONE-ACETAMINOPHEN 5-325 MG PO TABS: 1.0000 | ORAL_TABLET | ORAL | 0 refills | Status: DC | PRN

## 2022-03-07 NOTE — Telephone Encounter (Signed)
Pt was inform the the request was declined for Tylenol 3.  Pt called again this time asking for a refill for oxycodne   Please advise.

## 2022-03-09 ENCOUNTER — Other Ambulatory Visit: Payer: Self-pay | Admitting: Podiatry

## 2022-03-09 ENCOUNTER — Encounter: Payer: Self-pay | Admitting: Podiatry

## 2022-03-09 MED ORDER — AMOXICILLIN-POT CLAVULANATE 875-125 MG PO TABS: 1.0000 | ORAL_TABLET | Freq: Two times a day (BID) | ORAL | 0 refills | Status: DC

## 2022-03-09 NOTE — Telephone Encounter (Signed)
Please advise 

## 2022-03-10 ENCOUNTER — Telehealth: Payer: Self-pay | Admitting: Podiatry

## 2022-03-10 ENCOUNTER — Encounter: Payer: Self-pay | Admitting: Podiatry

## 2022-03-10 NOTE — Telephone Encounter (Addendum)
Pt sent mychart message and called and is needing the pain medication called in and would like the '10mg'$  not the '5mg'$  as the 5 mg is not working for her. She explained it in her mychart message.    She also asked about getting her nails trimmed and I told her she could have done at next appt on 12.8 but she is wanting to get it done sooner and may call a office in [place] to get it done.

## 2022-03-13 MED ORDER — OXYCODONE-ACETAMINOPHEN 10-325 MG PO TABS: 1.0000 | ORAL_TABLET | ORAL | 0 refills | Status: DC | PRN

## 2022-03-13 MED ORDER — DOXYCYCLINE HYCLATE 100 MG PO TABS: 100.0000 mg | ORAL_TABLET | Freq: Two times a day (BID) | ORAL | 0 refills | Status: DC

## 2022-03-13 MED ORDER — AMOXICILLIN-POT CLAVULANATE 875-125 MG PO TABS: 1.0000 | ORAL_TABLET | Freq: Two times a day (BID) | ORAL | 0 refills | Status: DC

## 2022-03-13 NOTE — Addendum Note (Signed)
Addended by: Boneta Lucks on: 03/13/2022 12:51 PM   Modules accepted: Orders

## 2022-03-13 NOTE — Progress Notes (Unsigned)
Office Visit    Patient Name: [name] Date of Encounter: 03/14/2022  PCP:  Lujean Amel, [place]  Cardiologist:  Jenkins Rouge, MD  Advanced Practice Provider:  No care team member to display Electrophysiologist:  None   HPI    [name] is a 66 y.o. female with a past medical history significant for hypertension, hypokalemia, depression, asthma, tachycardia during hospitalization for COVID/bacterial pneumonia presents today for annual follow-up appointment.  During that admission, heart rate initially 190 bpm given Cardizem in route to ED.  Heart rate 150s.  Converted with adenosine.  Discharged on Lopressor 75 twice daily for sinus tachycardia.  She then was at physical therapy and they noted an elevated heart rate.  No chest pain, dyspnea, palpitations, or syncope.  Was treated with antibiotics and amoxicillin for pneumococcal bacteremia.  CT negative for PE.  Left pleural effusion that could not be tapped.  TTE done 06/01/2020 with EF 60 to 65%, no significant valvular disease.  Follow-up chest x-ray 06/09/2020 with persistent left basilar consolidation and effusion unchanged.  EKG was done 06/09/2020 which showed narrow complex tachycardia, rate 150 bpm post adenosine her rate was 102 and she had clear P waves.  History of hepatitis C and asthma.  Non-smoker.  She had a partial knee replacement 04/29/2020 DOAC stopped with negative LE venous duplex and CTA for PE.  She was last seen 01/24/2021 and was doing well at that time.  She was doing some intermittent fasting for weight loss.  She was concerned about some scarring on the left lung from COVID.   She was last seen by me 10/18 and she stated she recently had surgery back in August on her left foot.  She then had an infection and has a wound VAC in place.  The foot is wrapped with an Ace wrap and she has a boot on.  She is having some lower extremity edema especially in the left leg.  Blood  pressure is elevated today at 152/80.  She brought a log on her phone for me to review and it seems like its been elevated the last several weeks.  She also mentioned that her potassium was low when she was in the hospital and she had to get it replaced via IV.  She was recently started on amlodipine for her blood pressure.  Today, she presents for follow-up. She sent me her BP log which was quite variable. We decided to continue current medications and allow her time to heal before titrating her medications.  Blood pressure well controlled today 122/82 with heart rate of 77 bpm.  She is tolerating her losartan that we started last time.  Blood pressure log has been reviewed today.  We have not made any additional adjustments.  Lower extremity edema is much improved.  Overall, doing much better.  Reports no shortness of breath nor dyspnea on exertion. Reports no chest pain, pressure, or tightness. No edema, orthopnea, PND. Reports no palpitations.  Past Medical History    Past Medical History:  Diagnosis Date   Asthma    Asthma    Depression    Depression    Depression    Erosive esophagitis    Headaches, cluster    Hepatitis C    HTN (hypertension)    HTN (hypertension)    HTN (hypertension)    Hypokalemia 05/29/2020   Past Surgical History:  Procedure Laterality Date   IR RADIOLOGIST EVAL &  MGMT  02/10/2022   KNEE SURGERY  04/2020    Allergies  Allergies  Allergen Reactions   Atenolol     FATIGUE/ HYPOTENSION   Lisinopril     COUGH/ WHEEZING   Paxil [Paroxetine Hcl]     INCREASED APPETITE FOR SWEETS   Zoloft [Sertraline Hcl]     TINNITUS   Elemental Sulfur Rash    FEVER     EKGs/Labs/Other Studies Reviewed:   The following studies were reviewed today:  Echocardiogram 06/01/2020 IMPRESSIONS     1. Left ventricular ejection fraction, by estimation, is 60 to 65%. The  left ventricle has normal function. The left ventricle has no regional  wall motion abnormalities.  Left ventricular diastolic parameters are  consistent with Grade I diastolic  dysfunction (impaired relaxation). Elevated left atrial pressure.   2. Right ventricular systolic function is normal. The right ventricular  size is normal.   3. The mitral valve is normal in structure. No evidence of mitral valve  regurgitation. No evidence of mitral stenosis.   4. The aortic valve is tricuspid. Aortic valve regurgitation is not  visualized. Mild aortic valve sclerosis is present, with no evidence of  aortic valve stenosis.   FINDINGS   Left Ventricle: Left ventricular ejection fraction, by estimation, is 60  to 65%. The left ventricle has normal function. The left ventricle has no  regional wall motion abnormalities. Definity contrast agent was given IV  to delineate the left ventricular   endocardial borders. The left ventricular internal cavity size was normal  in size. There is no left ventricular hypertrophy. Left ventricular  diastolic parameters are consistent with Grade I diastolic dysfunction  (impaired relaxation). Elevated left  atrial pressure.   Right Ventricle: The right ventricular size is normal.Right ventricular  systolic function is normal.   Left Atrium: Left atrial size was normal in size.   Right Atrium: Right atrial size was normal in size.   Pericardium: There is no evidence of pericardial effusion.   Mitral Valve: The mitral valve is normal in structure. No evidence of  mitral valve stenosis.   Tricuspid Valve: The tricuspid valve is normal in structure. Tricuspid  valve regurgitation is trivial. No evidence of tricuspid stenosis.   Aortic Valve: The aortic valve is tricuspid. Aortic valve regurgitation is  not visualized. Mild aortic valve sclerosis is present, with no evidence  of aortic valve stenosis.   Pulmonic Valve: The pulmonic valve was not well visualized. Pulmonic valve  regurgitation is not visualized. No evidence of pulmonic stenosis.    Aorta: The aortic root is normal in size and structure.   EKG:  EKG is not ordered today.    Recent Labs: 12/28/2021: Hemoglobin 13.3; Magnesium 1.9; Platelets 290 02/08/2022: BUN 20; Creatinine, Ser 0.77; Potassium 4.4; Sodium 141  Recent Lipid Panel    Component Value Date/Time   TRIG 114 05/29/2020 1232    Home Medications   Current Meds  Medication Sig   amLODipine (NORVASC) 5 MG tablet Take 1 tablet (5 mg total) by mouth daily.   amoxicillin-clavulanate (AUGMENTIN) 875-125 MG tablet TAKE 1 TABLET BY MOUTH TWICE A DAY   amoxicillin-clavulanate (AUGMENTIN) 875-125 MG tablet Take 1 tablet by mouth 2 (two) times daily.   amoxicillin-clavulanate (AUGMENTIN) 875-125 MG tablet Take 1 tablet by mouth 2 (two) times daily.   Calcium Carb-Cholecalciferol (CALCIUM-VITAMIN D3) 600-400 MG-UNIT TABS Take 1 tablet by mouth daily.   collagenase (SANTYL) 250 UNIT/GM ointment Apply 1 Application topically daily.   doxycycline (VIBRA-TABS)  100 MG tablet Take 1 tablet (100 mg total) by mouth 2 (two) times daily.   EPINEPHrine 0.3 mg/0.3 mL IJ SOAJ injection Inject 0.3 mg into the muscle as needed for anaphylaxis.   furosemide (LASIX) 20 MG tablet Take one tablet by mouth daily for the next 3 days then take daily as needed thereafter for lower extremity swelling   gabapentin (NEURONTIN) 100 MG capsule Take 1 capsule (100 mg total) by mouth 3 (three) times daily.   hydrOXYzine (ATARAX) 25 MG tablet Take 1 tablet (25 mg total) by mouth every 6 (six) hours as needed for up to 21 doses for itching.   ibuprofen (ADVIL) 800 MG tablet Take 1 tablet (800 mg total) by mouth every 6 (six) hours as needed.   losartan (COZAAR) 25 MG tablet Take 1 tablet (25 mg total) by mouth daily.   metoprolol tartrate 75 MG TABS Take 75 mg by mouth 2 (two) times daily.   Multiple Vitamins-Minerals (MULTIVITAMIN PO) Take 1 tablet by mouth daily.   omeprazole (PRILOSEC) 40 MG capsule Take 40 mg by mouth daily.    oxyCODONE-acetaminophen (PERCOCET) 10-325 MG tablet Take 1 tablet by mouth every 4 (four) hours as needed for pain.   oxyCODONE-acetaminophen (PERCOCET) 10-325 MG tablet Take 1 tablet by mouth every 4 (four) hours as needed for pain.   oxyCODONE-acetaminophen (PERCOCET) 10-325 MG tablet Take 1 tablet by mouth every 4 (four) hours as needed for pain.   oxyCODONE-acetaminophen (PERCOCET) 10-325 MG tablet Take 1 tablet by mouth every 4 (four) hours as needed for pain.   oxyCODONE-acetaminophen (PERCOCET) 5-325 MG tablet Take 1 tablet by mouth every 4 (four) hours as needed for severe pain.   potassium chloride (KLOR-CON) 10 MEQ tablet Take one tablet by mouth daily for the next 3 days then take daily as needed thereafter for lower extremity swelling   venlafaxine XR (EFFEXOR-XR) 75 MG 24 hr capsule Take 1 capsule (75 mg total) by mouth daily with breakfast.     Review of Systems      All other systems reviewed and are otherwise negative except as noted above.  Physical Exam    VS:  BP 122/82   Pulse 77   Ht '5\' 4"'$  (1.626 m)   Wt 219 lb (99.3 kg)   SpO2 98%   BMI 37.59 kg/m  , BMI Body mass index is 37.59 kg/m.  Wt Readings from Last 3 Encounters:  03/14/22 219 lb (99.3 kg)  02/08/22 219 lb 9.6 oz (99.6 kg)  01/07/22 217 lb (98.4 kg)     GEN: Well nourished, well developed, in no acute distress. HEENT: normal. Neck: Supple, no JVD, carotid bruits, or masses. Cardiac: RRR, no murmurs, rubs, or gallops. No clubbing, cyanosis, 1-2 + non pitting LE edema L > R.  Radials/PT 2+ and equal bilaterally.  Respiratory:  Respirations regular and unlabored, clear to auscultation bilaterally. GI: Soft, nontender, nondistended. MS: No deformity or atrophy. Skin: Warm and dry, no rash. Neuro:  Strength and sensation are intact. Psych: Normal affect.  Assessment & Plan    SVT in the setting of meningococcal pneumonia and COVID -stable on metoprolol 75 mg BID, continue -NSR in the 70s  today  Lower extremity edema  -resolved, PRN lasix  -Recent left foot surgery with infection, wound vac removed.  Edema improved on lasix-continue PRN -BMP reviewed and potassium recovered  Hypertension -well controlled in the clinc 122/82 -Recently started on amlodipine 5 mg daily and losartan 25 mg daily -Continue to  log your blood pressure an hour after morning medicines   CAD -no chest pain or SOB -Continue current medication regimen    Disposition: Follow up 6 month with Jenkins Rouge, MD or APP.   Signed, Elgie Collard, PA-C 03/14/2022, 5:25 PM [facility]

## 2022-03-14 ENCOUNTER — Ambulatory Visit: Payer: PPO | Attending: Physician Assistant | Admitting: Physician Assistant

## 2022-03-14 ENCOUNTER — Encounter: Payer: Self-pay | Admitting: Physician Assistant

## 2022-03-14 VITALS — BP 122/82 | HR 77 | Ht 64.0 in | Wt 219.0 lb

## 2022-03-14 DIAGNOSIS — R600 Localized edema: Secondary | ICD-10-CM

## 2022-03-14 DIAGNOSIS — S91002A Unspecified open wound, left ankle, initial encounter: Secondary | ICD-10-CM | POA: Diagnosis not present

## 2022-03-14 DIAGNOSIS — I10 Essential (primary) hypertension: Secondary | ICD-10-CM

## 2022-03-14 DIAGNOSIS — I471 Supraventricular tachycardia, unspecified: Secondary | ICD-10-CM

## 2022-03-14 DIAGNOSIS — R002 Palpitations: Secondary | ICD-10-CM | POA: Diagnosis not present

## 2022-03-14 DIAGNOSIS — I2510 Atherosclerotic heart disease of native coronary artery without angina pectoris: Secondary | ICD-10-CM

## 2022-03-14 DIAGNOSIS — T8189XA Other complications of procedures, not elsewhere classified, initial encounter: Secondary | ICD-10-CM | POA: Diagnosis not present

## 2022-03-14 DIAGNOSIS — T8131XA Disruption of external operation (surgical) wound, not elsewhere classified, initial encounter: Secondary | ICD-10-CM | POA: Diagnosis not present

## 2022-03-14 NOTE — Patient Instructions (Signed)
Medication Instructions:  Your physician recommends that you continue on your current medications as directed. Please refer to the Current Medication list given to you today.  *If you need a refill on your cardiac medications before your next appointment, please call your pharmacy*   Lab Work: None If you have labs (blood work) drawn today and your tests are completely normal, you will receive your results only by: [place] (if you have MyChart) OR A paper copy in the mail If you have any lab test that is abnormal or we need to change your treatment, we will call you to review the results.   Follow-Up: At [facility], you and your health needs are our priority.  As part of our continuing mission to provide you with exceptional heart care, we have created designated Provider Care Teams.  These Care Teams include your primary Cardiologist (physician) and Advanced Practice Providers (APPs -  Physician Assistants and Nurse Practitioners) who all work together to provide you with the care you need, when you need it.  Your next appointment:   6 month(s)  The format for your next appointment:   In Person  Provider:   Jenkins Rouge, MD    Important Information About Sugar

## 2022-03-21 ENCOUNTER — Telehealth: Payer: Self-pay | Admitting: *Deleted

## 2022-03-21 MED ORDER — OXYCODONE-ACETAMINOPHEN 10-325 MG PO TABS: 1.0000 | ORAL_TABLET | ORAL | 0 refills | Status: DC | PRN

## 2022-03-21 NOTE — Telephone Encounter (Signed)
Patient updated.

## 2022-03-21 NOTE — Telephone Encounter (Signed)
Patient is calling to request a medication refill of the oxycodone-ace, 10-325 mg. Please advise.

## 2022-03-23 ENCOUNTER — Telehealth: Payer: Self-pay

## 2022-03-27 ENCOUNTER — Telehealth: Payer: Self-pay | Admitting: Podiatry

## 2022-03-27 MED ORDER — OXYCODONE-ACETAMINOPHEN 10-325 MG PO TABS: 1.0000 | ORAL_TABLET | ORAL | 0 refills | Status: DC | PRN

## 2022-03-27 NOTE — Addendum Note (Signed)
Addended by: Boneta Lucks on: 03/27/2022 10:48 AM   Modules accepted: Orders

## 2022-03-27 NOTE — Telephone Encounter (Signed)
Patient was calling to get a refill on pain meds

## 2022-03-28 DIAGNOSIS — J45909 Unspecified asthma, uncomplicated: Secondary | ICD-10-CM | POA: Diagnosis not present

## 2022-03-28 DIAGNOSIS — Z6837 Body mass index (BMI) 37.0-37.9, adult: Secondary | ICD-10-CM | POA: Diagnosis not present

## 2022-03-28 DIAGNOSIS — F32A Depression, unspecified: Secondary | ICD-10-CM | POA: Diagnosis not present

## 2022-03-28 DIAGNOSIS — T8149XD Infection following a procedure, other surgical site, subsequent encounter: Secondary | ICD-10-CM | POA: Diagnosis not present

## 2022-03-28 DIAGNOSIS — T8131XA Disruption of external operation (surgical) wound, not elsewhere classified, initial encounter: Secondary | ICD-10-CM | POA: Diagnosis not present

## 2022-03-28 DIAGNOSIS — I10 Essential (primary) hypertension: Secondary | ICD-10-CM | POA: Diagnosis not present

## 2022-03-28 DIAGNOSIS — S91002A Unspecified open wound, left ankle, initial encounter: Secondary | ICD-10-CM | POA: Diagnosis not present

## 2022-03-28 DIAGNOSIS — Z792 Long term (current) use of antibiotics: Secondary | ICD-10-CM | POA: Diagnosis not present

## 2022-03-28 DIAGNOSIS — T8189XA Other complications of procedures, not elsewhere classified, initial encounter: Secondary | ICD-10-CM | POA: Diagnosis not present

## 2022-03-28 DIAGNOSIS — B1920 Unspecified viral hepatitis C without hepatic coma: Secondary | ICD-10-CM | POA: Diagnosis not present

## 2022-03-28 DIAGNOSIS — E669 Obesity, unspecified: Secondary | ICD-10-CM | POA: Diagnosis not present

## 2022-03-31 ENCOUNTER — Ambulatory Visit: Payer: PPO | Admitting: Podiatry

## 2022-03-31 DIAGNOSIS — T8130XA Disruption of wound, unspecified, initial encounter: Secondary | ICD-10-CM | POA: Diagnosis not present

## 2022-03-31 NOTE — Progress Notes (Signed)
Subjective:  Patient ID: [name], female    DOB: [birth year],  MRN: [id number]  Chief Complaint  Patient presents with   Routine Post Op    DOS: 12/12/2021 Procedure: Left Achilles tendon repair with gastrocnemius recession with Haglund's resection  66 y.o. female returns for post-op check.  patient states she is doing okay.  She states she is doing okay.  She states she is doing well has done much better denies any other acute complaints.  Review of Systems: Negative except as noted in the HPI. Denies N/V/F/Ch.  Past Medical History:  Diagnosis Date   Asthma    Asthma    Depression    Depression    Depression    Erosive esophagitis    Headaches, cluster    Hepatitis C    HTN (hypertension)    HTN (hypertension)    HTN (hypertension)    Hypokalemia 05/29/2020    Current Outpatient Medications:    collagenase (SANTYL) 250 UNIT/GM ointment, Apply 1 Application topically daily., Disp: 15 g, Rfl: 0   amLODipine (NORVASC) 5 MG tablet, Take 1 tablet (5 mg total) by mouth daily., Disp: 30 tablet, Rfl: 0   amoxicillin-clavulanate (AUGMENTIN) 875-125 MG tablet, Take 1 tablet by mouth 2 (two) times daily., Disp: 20 tablet, Rfl: 0   amoxicillin-clavulanate (AUGMENTIN) 875-125 MG tablet, Take 1 tablet by mouth 2 (two) times daily., Disp: 20 tablet, Rfl: 0   Calcium Carb-Cholecalciferol (CALCIUM-VITAMIN D3) 600-400 MG-UNIT TABS, Take 1 tablet by mouth daily., Disp: , Rfl:    doxycycline (VIBRA-TABS) 100 MG tablet, Take 1 tablet (100 mg total) by mouth 2 (two) times daily., Disp: 60 tablet, Rfl: 0   EPINEPHrine 0.3 mg/0.3 mL IJ SOAJ injection, Inject 0.3 mg into the muscle as needed for anaphylaxis., Disp: 1 each, Rfl: 2   furosemide (LASIX) 20 MG tablet, Take one tablet by mouth daily for the next 3 days then take daily as needed thereafter for lower extremity swelling, Disp: 30 tablet, Rfl: 0   gabapentin (NEURONTIN) 100 MG capsule, Take 1 capsule (100 mg total) by mouth 3 (three)  times daily., Disp: 90 capsule, Rfl: 3   hydrOXYzine (ATARAX) 25 MG tablet, Take 1 tablet (25 mg total) by mouth every 6 (six) hours as needed for up to 21 doses for itching., Disp: 21 tablet, Rfl: 0   ibuprofen (ADVIL) 800 MG tablet, Take 1 tablet (800 mg total) by mouth every 6 (six) hours as needed., Disp: 60 tablet, Rfl: 1   losartan (COZAAR) 25 MG tablet, Take 1 tablet (25 mg total) by mouth daily., Disp: 90 tablet, Rfl: 3   metoprolol tartrate 75 MG TABS, Take 75 mg by mouth 2 (two) times daily., Disp: 60 tablet, Rfl: 0   Multiple Vitamins-Minerals (MULTIVITAMIN PO), Take 1 tablet by mouth daily., Disp: , Rfl:    omeprazole (PRILOSEC) 40 MG capsule, Take 40 mg by mouth daily., Disp: , Rfl: 10   oxyCODONE-acetaminophen (PERCOCET) 10-325 MG tablet, Take 1 tablet by mouth every 4 (four) hours as needed for pain., Disp: 30 tablet, Rfl: 0   oxyCODONE-acetaminophen (PERCOCET) 10-325 MG tablet, Take 1 tablet by mouth every 4 (four) hours as needed for pain., Disp: 30 tablet, Rfl: 0   oxyCODONE-acetaminophen (PERCOCET) 10-325 MG tablet, Take 1 tablet by mouth every 4 (four) hours as needed for pain., Disp: 30 tablet, Rfl: 0   potassium chloride (KLOR-CON) 10 MEQ tablet, Take one tablet by mouth daily for the next 3 days then take daily as  needed thereafter for lower extremity swelling, Disp: 30 tablet, Rfl: 0   venlafaxine XR (EFFEXOR-XR) 75 MG 24 hr capsule, Take 1 capsule (75 mg total) by mouth daily with breakfast., Disp: 30 capsule, Rfl: 0  Social History   Tobacco Use  Smoking Status Never  Smokeless Tobacco Never    Allergies  Allergen Reactions   Atenolol     FATIGUE/ HYPOTENSION   Lisinopril     COUGH/ WHEEZING   Paxil [Paroxetine Hcl]     INCREASED APPETITE FOR SWEETS   Zoloft [Sertraline Hcl]     TINNITUS   Elemental Sulfur Rash    FEVER   Objective:  There were no vitals filed for this visit. There is no height or weight on file to calculate BMI. Constitutional Well  developed. Well nourished.  Vascular Foot warm and well perfused. Capillary refill normal to all digits.   Neurologic Normal speech. Oriented to person, place, and time. Epicritic sensation to light touch grossly present bilaterally.  Dermatologic Wound dehiscence noted at the incision site of the Achilles.  The measurements are below.  No exposure of tendon noted.  No complication noted.  Manual muscle strength is intact 4 out of 5.  Plantar flexion of the Achilles tendon noted.  Good range of motion noted of the ankle joint active and passive  Orthopedic: Tenderness to palpation noted about the surgical site.   Radiographs: 3 views of skeletally mature adult left foot: Resection of Haglund's noted  Assessment:   No diagnosis found.    Plan:  Patient was evaluated and treated and all questions answered.  S/p foot surgery left -Progressing as expected post-operatively. -XR: See above -WB Status: Begin weightbearing as tolerated to the left lower extremity with the boot. -Sutures: None -Medications: Continue Augmentin and Percocet -Patient can transition between central wet-to-dry and dry dressing -I will keep her on antibiotics until resolve meant -The wound measurement is 1 cm x 0.5 cm x 0.2 cm more granulation tissue noted   No follow-ups on file.

## 2022-04-04 ENCOUNTER — Other Ambulatory Visit: Payer: Self-pay | Admitting: Cardiovascular Disease

## 2022-04-04 ENCOUNTER — Telehealth: Payer: Self-pay | Admitting: *Deleted

## 2022-04-04 MED ORDER — AMOXICILLIN-POT CLAVULANATE 875-125 MG PO TABS: 1.0000 | ORAL_TABLET | Freq: Two times a day (BID) | ORAL | 0 refills | Status: AC

## 2022-04-04 MED ORDER — OXYCODONE-ACETAMINOPHEN 10-325 MG PO TABS: 1.0000 | ORAL_TABLET | ORAL | 0 refills | Status: DC | PRN

## 2022-04-04 NOTE — Telephone Encounter (Signed)
Patient has been seen for office visit on 03/31/22 and spoke with her previously,is having home health wound care as well.

## 2022-04-04 NOTE — Telephone Encounter (Signed)
Patient is calling to  request a refill of the Augmentin-(3 pills remaining), refill of pain medicine Percocet. Please advise.

## 2022-04-05 ENCOUNTER — Telehealth: Payer: Self-pay | Admitting: *Deleted

## 2022-04-05 NOTE — Telephone Encounter (Signed)
Mark w/ Latricia Heft  PT is calling to ask for the WB status for patient, explained per last office notes of  physician to therapist, verbalized understanding.

## 2022-04-07 DIAGNOSIS — T8131XA Disruption of external operation (surgical) wound, not elsewhere classified, initial encounter: Secondary | ICD-10-CM | POA: Diagnosis not present

## 2022-04-07 DIAGNOSIS — S91002A Unspecified open wound, left ankle, initial encounter: Secondary | ICD-10-CM | POA: Diagnosis not present

## 2022-04-07 DIAGNOSIS — T8189XA Other complications of procedures, not elsewhere classified, initial encounter: Secondary | ICD-10-CM | POA: Diagnosis not present

## 2022-04-10 ENCOUNTER — Encounter: Payer: Self-pay | Admitting: Podiatry

## 2022-04-10 NOTE — Telephone Encounter (Signed)
Please advise 

## 2022-04-11 ENCOUNTER — Telehealth: Payer: Self-pay | Admitting: Podiatry

## 2022-04-11 ENCOUNTER — Other Ambulatory Visit: Payer: Self-pay | Admitting: Podiatry

## 2022-04-11 MED ORDER — OXYCODONE-ACETAMINOPHEN 10-325 MG PO TABS: 1.0000 | ORAL_TABLET | ORAL | 0 refills | Status: DC | PRN

## 2022-04-11 MED ORDER — AMOXICILLIN-POT CLAVULANATE 875-125 MG PO TABS: 1.0000 | ORAL_TABLET | Freq: Two times a day (BID) | ORAL | 0 refills | Status: DC

## 2022-04-11 NOTE — Telephone Encounter (Signed)
Patient called and stated that she needed a refill on her antibiotics, she will run out Christmas day  Patient is also needing a refill on pain meds

## 2022-04-19 ENCOUNTER — Telehealth: Payer: Self-pay | Admitting: Podiatry

## 2022-04-19 MED ORDER — OXYCODONE-ACETAMINOPHEN 10-325 MG PO TABS: 1.0000 | ORAL_TABLET | Freq: Four times a day (QID) | ORAL | 0 refills | Status: AC | PRN

## 2022-04-19 NOTE — Telephone Encounter (Signed)
Rx refill was sent today.

## 2022-04-19 NOTE — Telephone Encounter (Signed)
Pt requested a RX refill on Oxycodone,10-325 for pain. Please advise.

## 2022-04-22 ENCOUNTER — Other Ambulatory Visit: Payer: Self-pay | Admitting: Podiatry

## 2022-04-25 ENCOUNTER — Telehealth: Payer: Self-pay | Admitting: Podiatry

## 2022-04-25 MED ORDER — OXYCODONE-ACETAMINOPHEN 10-325 MG PO TABS: 1.0000 | ORAL_TABLET | ORAL | 0 refills | Status: DC | PRN

## 2022-04-25 NOTE — Addendum Note (Signed)
Addended by: Boneta Lucks on: 04/25/2022 12:03 PM   Modules accepted: Orders

## 2022-04-25 NOTE — Telephone Encounter (Signed)
Pain medication refill request:  oxyCODONE-acetaminophen (PERCOCET) 10-325 MG tablet   [facility] [id number] - [place], [address]   Please advise

## 2022-04-28 ENCOUNTER — Ambulatory Visit (INDEPENDENT_AMBULATORY_CARE_PROVIDER_SITE_OTHER): Payer: PPO | Admitting: Podiatry

## 2022-04-28 DIAGNOSIS — T8130XA Disruption of wound, unspecified, initial encounter: Secondary | ICD-10-CM

## 2022-04-28 NOTE — Progress Notes (Signed)
Subjective:  Patient ID: [name], female    DOB: 12-27-1955,  MRN: [id number]  Chief Complaint  Patient presents with   Routine Post Op    DOS: 12/12/2021 Procedure: Left Achilles tendon repair with gastrocnemius recession with Haglund's resection  67 y.o. female returns for post-op check.  patient states she is doing okay.  She states she is doing okay.  The wound is improving considerably.  Only a small size left.  Denies any other acute issues  Review of Systems: Negative except as noted in the HPI. Denies N/V/F/Ch.  Past Medical History:  Diagnosis Date   Asthma    Asthma    Depression    Depression    Depression    Erosive esophagitis    Headaches, cluster    Hepatitis C    HTN (hypertension)    HTN (hypertension)    HTN (hypertension)    Hypokalemia 05/29/2020    Current Outpatient Medications:    amLODipine (NORVASC) 5 MG tablet, Take 1 tablet (5 mg total) by mouth daily., Disp: 30 tablet, Rfl: 0   amoxicillin-clavulanate (AUGMENTIN) 875-125 MG tablet, Take 1 tablet by mouth 2 (two) times daily., Disp: 20 tablet, Rfl: 0   amoxicillin-clavulanate (AUGMENTIN) 875-125 MG tablet, Take 1 tablet by mouth 2 (two) times daily., Disp: 60 tablet, Rfl: 0   Calcium Carb-Cholecalciferol (CALCIUM-VITAMIN D3) 600-400 MG-UNIT TABS, Take 1 tablet by mouth daily., Disp: , Rfl:    collagenase (SANTYL) 250 UNIT/GM ointment, Apply 1 Application topically daily., Disp: 15 g, Rfl: 0   EPINEPHrine 0.3 mg/0.3 mL IJ SOAJ injection, Inject 0.3 mg into the muscle as needed for anaphylaxis., Disp: 1 each, Rfl: 2   furosemide (LASIX) 20 MG tablet, Take one tablet by mouth daily for the next 3 days then take daily as needed thereafter for lower extremity swelling, Disp: 30 tablet, Rfl: 0   gabapentin (NEURONTIN) 100 MG capsule, Take 1 capsule (100 mg total) by mouth 3 (three) times daily., Disp: 90 capsule, Rfl: 3   hydrOXYzine (ATARAX) 25 MG tablet, Take 1 tablet (25 mg total) by mouth every  6 (six) hours as needed for up to 21 doses for itching., Disp: 21 tablet, Rfl: 0   ibuprofen (ADVIL) 800 MG tablet, Take 1 tablet (800 mg total) by mouth every 6 (six) hours as needed., Disp: 60 tablet, Rfl: 1   losartan (COZAAR) 25 MG tablet, Take 1 tablet (25 mg total) by mouth daily., Disp: 90 tablet, Rfl: 3   metoprolol tartrate (LOPRESSOR) 50 MG tablet, TAKE 1 AND 1/2 TABLET BY MOUTH TWO TIMES A DAY, Disp: 270 tablet, Rfl: 2   metoprolol tartrate 75 MG TABS, Take 75 mg by mouth 2 (two) times daily., Disp: 60 tablet, Rfl: 0   Multiple Vitamins-Minerals (MULTIVITAMIN PO), Take 1 tablet by mouth daily., Disp: , Rfl:    omeprazole (PRILOSEC) 40 MG capsule, Take 40 mg by mouth daily., Disp: , Rfl: 10   oxyCODONE-acetaminophen (PERCOCET) 10-325 MG tablet, Take 1 tablet by mouth every 4 (four) hours as needed for pain., Disp: 30 tablet, Rfl: 0   oxyCODONE-acetaminophen (PERCOCET) 10-325 MG tablet, Take 1 tablet by mouth every 4 (four) hours as needed for pain., Disp: 30 tablet, Rfl: 0   oxyCODONE-acetaminophen (PERCOCET) 10-325 MG tablet, Take 1 tablet by mouth every 4 (four) hours as needed for pain., Disp: 30 tablet, Rfl: 0   oxyCODONE-acetaminophen (PERCOCET) 10-325 MG tablet, Take 1 tablet by mouth every 4 (four) hours as needed for pain., Disp:  30 tablet, Rfl: 0   potassium chloride (KLOR-CON) 10 MEQ tablet, Take one tablet by mouth daily for the next 3 days then take daily as needed thereafter for lower extremity swelling, Disp: 30 tablet, Rfl: 0   venlafaxine XR (EFFEXOR-XR) 75 MG 24 hr capsule, Take 1 capsule (75 mg total) by mouth daily with breakfast., Disp: 30 capsule, Rfl: 0  Social History   Tobacco Use  Smoking Status Never  Smokeless Tobacco Never    Allergies  Allergen Reactions   Atenolol     FATIGUE/ HYPOTENSION   Lisinopril     COUGH/ WHEEZING   Paxil [Paroxetine Hcl]     INCREASED APPETITE FOR SWEETS   Zoloft [Sertraline Hcl]     TINNITUS   Elemental Sulfur Rash     FEVER   Objective:  There were no vitals filed for this visit. There is no height or weight on file to calculate BMI. Constitutional Well developed. Well nourished.  Vascular Foot warm and well perfused. Capillary refill normal to all digits.   Neurologic Normal speech. Oriented to person, place, and time. Epicritic sensation to light touch grossly present bilaterally.  Dermatologic Wound dehiscence noted at the incision site of the Achilles.  The measurements are below.  No exposure of tendon noted.  No complication noted.  Manual muscle strength is intact 4 out of 5.  Plantar flexion of the Achilles tendon noted.  Good range of motion noted of the ankle joint active and passive  Orthopedic: Some tenderness to palpation noted about the surgical site.   Radiographs: 3 views of skeletally mature adult left foot: Resection of Haglund's noted  Assessment:   No diagnosis found.    Plan:  Patient was evaluated and treated and all questions answered.  S/p foot surgery left -Progressing as expected post-operatively. -XR: See above -WB Status: Weightbearing as tolerated in regular shoes. -Sutures: None -Medications: Percocet was dispensed. -Patient can do triple antibiotic and Band-Aid at this point -I will keep her on antibiotics until resolve meant -The wound measurement is 0.1 cm x 1.1 cm x 0.1 cm cm more granulation tissue noted -At this time she is cleared to go back to her home city and be seen by podiatrist further management.  At this point the wound has closed mostly and may just finish closing over the next few weeks.  Patient is in agreement and will get back to me if she decides to stay in the city.   No follow-ups on file.

## 2022-05-03 ENCOUNTER — Other Ambulatory Visit: Payer: Self-pay | Admitting: Podiatry

## 2022-05-03 ENCOUNTER — Encounter: Payer: Self-pay | Admitting: Podiatry

## 2022-05-03 MED ORDER — OXYCODONE-ACETAMINOPHEN 10-325 MG PO TABS: 1.0000 | ORAL_TABLET | ORAL | 0 refills | Status: DC | PRN

## 2022-05-03 NOTE — Telephone Encounter (Signed)
Please advise 

## 2022-05-10 ENCOUNTER — Encounter: Payer: Self-pay | Admitting: Podiatry

## 2022-05-10 ENCOUNTER — Other Ambulatory Visit: Payer: Self-pay | Admitting: Podiatry

## 2022-05-10 ENCOUNTER — Other Ambulatory Visit: Payer: Self-pay | Admitting: Interventional Radiology

## 2022-05-10 DIAGNOSIS — I10 Essential (primary) hypertension: Secondary | ICD-10-CM

## 2022-05-10 DIAGNOSIS — I739 Peripheral vascular disease, unspecified: Secondary | ICD-10-CM

## 2022-05-10 MED ORDER — OXYCODONE-ACETAMINOPHEN 10-325 MG PO TABS: 1.0000 | ORAL_TABLET | ORAL | 0 refills | Status: DC | PRN

## 2022-05-11 MED ORDER — LOSARTAN POTASSIUM 50 MG PO TABS: 50.0000 mg | ORAL_TABLET | Freq: Every day | ORAL | 3 refills | Status: DC

## 2022-05-11 NOTE — Telephone Encounter (Signed)
Called pt in regards to my chart message.  Verified that losartan 50 mg is to be sent to [facility], [address].  Pt will have labs drawn in [place], [place].  Ordered placed for BMP and released for draw at any [facility] facility.  Pt advised to have labs drawn in 1-2 weeks.  Will have labs drawn around 1/31-05/26/22.  Advised pt to continue to monitor BP.  Pt had no further questions or concerns.

## 2022-05-25 LAB — BASIC METABOLIC PANEL
BUN/Creatinine Ratio: 20 (ref 12–28)
BUN: 12 mg/dL (ref 8–27)
CO2: 25 mmol/L (ref 20–29)
Calcium: 10.1 mg/dL (ref 8.7–10.3)
Chloride: 101 mmol/L (ref 96–106)
Creatinine, Ser: 0.61 mg/dL (ref 0.57–1.00)
Glucose: 103 mg/dL — ABNORMAL HIGH (ref 70–99)
Potassium: 4.4 mmol/L (ref 3.5–5.2)
Sodium: 144 mmol/L (ref 134–144)
eGFR: 99 mL/min/{1.73_m2} (ref >59)

## 2022-06-06 NOTE — Telephone Encounter (Signed)
Error

## 2022-06-14 ENCOUNTER — Encounter (HOSPITAL_BASED_OUTPATIENT_CLINIC_OR_DEPARTMENT_OTHER): Payer: Medicare PPO | Attending: General Surgery | Admitting: General Surgery

## 2022-06-14 ENCOUNTER — Encounter: Payer: Self-pay | Admitting: Podiatry

## 2022-06-14 DIAGNOSIS — T8131XA Disruption of external operation (surgical) wound, not elsewhere classified, initial encounter: Secondary | ICD-10-CM | POA: Diagnosis not present

## 2022-06-14 DIAGNOSIS — I10 Essential (primary) hypertension: Secondary | ICD-10-CM | POA: Diagnosis not present

## 2022-06-14 DIAGNOSIS — L97422 Non-pressure chronic ulcer of left heel and midfoot with fat layer exposed: Secondary | ICD-10-CM | POA: Insufficient documentation

## 2022-06-14 NOTE — Progress Notes (Signed)
[name], [name] ([id number]) [id number] Page 1 of 9 Visit Report for 06/14/2022 Chief Complaint Document Details Patient Name: Date of Service: [facility] [name], [place] [name] 06/14/2022 8:00 A M Medical Record Number: [id number] Patient Account Number: [id number] Date of Birth/Sex: Treating RN: 08-26-1955 (67 y.o. F) Primary Care Provider: Dorthy [name], [name] Other Clinician: Referring Provider: Treating Provider/Extender: [name], [name] Weeks in Treatment: 0 Information Obtained from: Patient Chief Complaint Patient presents to the wound care center with open non-healing surgical wound(s) Electronic Signature(s) Signed: 06/14/2022 8:43:37 AM By: [name] Maudlin MD FACS Entered By: [name] on 06/14/2022 08:43:37 -------------------------------------------------------------------------------- Debridement Details Patient Name: Date of Service: [name], [name] 06/14/2022 8:00 A M Medical Record Number: [id number] Patient Account Number: [id number] Date of Birth/Sex: Treating RN: Jan 09, 1956 (67 y.o. [name], [name] Primary Care Provider: Dorthy [name], [name] Other Clinician: Referring Provider: Treating Provider/Extender: [name], [name] Weeks in Treatment: 0 Debridement Performed for Assessment: Wound #1 Left Achilles Performed By: Physician [name] Maudlin, MD Debridement Type: Debridement Level of Consciousness (Pre-procedure): Awake and Alert Pre-procedure Verification/Time Out Yes - 08:36 Taken: Start Time: 08:37 Pain Control: Lidocaine 4% T opical Solution T Area Debrided (L x W): otal 0.3 (cm) x 0.3 (cm) = 0.09 (cm) Tissue and other material debrided: Non-Viable, Slough, Slough Level: Non-Viable Tissue Debridement Description: Selective/Open Wound Instrument: Curette Bleeding: Minimum Hemostasis Achieved: Pressure Response to Treatment: Procedure was tolerated well Level of Consciousness (Post- Awake and  Alert procedure): Post Debridement Measurements of Total Wound Length: (cm) 0.3 Width: (cm) 0.3 Depth: (cm) 0.1 Volume: (cm) 0.007 Character of Wound/Ulcer Post Debridement: Stable Post Procedure Diagnosis Same as Pre-procedure [name], [name] ([id number].pdf Page 2 of 9 Notes Scribed for Dr. Celine [name] by [name], RN Electronic Signature(s) Signed: 06/14/2022 10:33:46 AM By: [name] Maudlin MD FACS Signed: 06/14/2022 4:23:23 PM By: [name] RN Entered By: [name] on 06/14/2022 08:38:19 -------------------------------------------------------------------------------- HPI Details Patient Name: Date of Service: [name], [name] 06/14/2022 8:00 A M Medical Record Number: [id number] Patient Account Number: [id number] Date of Birth/Sex: Treating RN: [birth year] (67 y.o. F) Primary Care Provider: Dorthy [name], [name] Other Clinician: Referring Provider: Treating Provider/Extender: [name], [name] Weeks in Treatment: 0 History of Present Illness HPI Description: ADMISSION 06/14/2022 This is a 67 year old otherwise quite healthy woman who underwent repair of the Haglund's deformity and ruptured Achilles tendon back in August 2023. The wound subsequently became infected and dehisced. She ended up hospitalized for the infection and has since been on multiple courses of oral antibiotics along with use of a wound VAC for a time then Betadine wet-to-dry dressing changes. [place] sure that her wound was healed and she would be able to complete her move without difficulty. She has just been using triple antibiotic ointment and a Band-Aid at this point. On the back of her left heel, there is a small circular wound in the otherwise well-healed scar line. There is some dry eschar around the edges. The wound itself is just a couple of millimeters in diameter. No concern for  infection. Electronic Signature(s) Signed: 06/14/2022 8:46:11 AM By: [name] Maudlin MD FACS Entered By: [name] on 06/14/2022 08:46:10 -------------------------------------------------------------------------------- Physical Exam Details Patient Name: Date of Service: [name], [name] 06/14/2022 8:00 A M Medical Record Number: [id number] Patient Account Number: [id number] Date of Birth/Sex: Treating RN: 05/02/1955 (67 y.o. F) Primary Care Provider: Dorthy [name], [name] Other Clinician: Referring Provider: Treating  Provider/Extender: [name], [name] Weeks in Treatment: 0 Constitutional .Tachycardic, asymptomatic. . . No acute distress. Respiratory Normal work of breathing on room air. Notes 06/14/2022: On the back of her left heel, there is a small circular wound in the otherwise well-healed scar line. There is some dry eschar around the edges. The wound itself is just a couple of millimeters in diameter. No concern for infection. Electronic Signature(s) Signed: 06/14/2022 8:46:42 AM By: [name] Maudlin MD FACS Entered By: [name] on 06/14/2022 08:46:42 [name], [name] ([id number].pdf Page 3 of 9 -------------------------------------------------------------------------------- Physician Orders Details Patient Name: Date of Service: [name], [name] 06/14/2022 8:00 A M Medical Record Number: [id number] Patient Account Number: [id number] Date of Birth/Sex: Treating RN: 03-22-1956 (67 y.o. [name], [name] Primary Care Provider: Dorthy [name], [name] Other Clinician: Referring Provider: Treating Provider/Extender: [name], [name] Weeks in Treatment: 0 Verbal / Phone Orders: No Diagnosis Coding ICD-10 Coding Code Description L97.422 Non-pressure chronic ulcer of left heel and midfoot with fat layer exposed T81.31XS Disruption of external operation (surgical) wound, not elsewhere classified, sequela I10  Essential (primary) hypertension Follow-up Appointments ppointment in 1 week. - Dr. Celine [name] Rm 4 Return A Anesthetic Wound #1 Left Achilles (In clinic) Topical Lidocaine 4% applied to wound bed Bathing/ Shower/ Hygiene May shower and wash wound with soap and water. - Make sure to dry wound area really well Edema Control - Lymphedema / SCD / Other Left Lower Extremity Elevate legs to the level of the heart or above for 30 minutes daily and/or when sitting for 3-4 times a day throughout the day. Exercise regularly Moisturize legs daily. Wound Treatment Wound #1 - Achilles Wound Laterality: Left Cleanser: Soap and Water 1 x Per Day/30 Days Discharge Instructions: May shower and wash wound with dial antibacterial soap and water prior to dressing change. Cleanser: Byram Ancillary Kit - 15 Day Supply (DME) (Generic) 1 x Per Day/30 Days Discharge Instructions: Use supplies as instructed; Kit contains: (15) Saline Bullets; (15) 3x3 Gauze; 15 pr Gloves Peri-Wound Care: Sween Lotion (Moisturizing lotion) 1 x Per Day/30 Days Discharge Instructions: Apply moisturizing lotion as directed Prim Dressing: Promogran Prisma Matrix, 4.34 (sq in) (silver collagen) (DME) (Generic) 1 x Per Day/30 Days ary Discharge Instructions: Moisten collagen with saline or hydrogel Secondary Dressing: Zetuvit Plus Silicone Border Dressing 4x4 (in/in) (DME) (Generic) 1 x Per Day/30 Days Discharge Instructions: Apply silicone border over primary dressing as directed. Electronic Signature(s) Signed: 06/14/2022 10:33:46 AM By: [name] Maudlin MD FACS Entered By: [name] on 06/14/2022 08:46:53 -------------------------------------------------------------------------------- Problem List Details Patient Name: Date of Service: [facility] [name], [place] [name]. 06/14/2022 8:00 A M Medical Record Number: [id number] Patient Account Number: [id number] [name], [name] ([id number]) [phone].pdf Page 4 of  9 Date of Birth/Sex: Treating RN: [birth year] (67 y.o. F) Primary Care Provider: Other Clinician: Dorthy [name], [name] Referring Provider: Treating Provider/Extender: [name], [name] Weeks in Treatment: 0 Active Problems ICD-10 Encounter Code Description Active Date MDM Diagnosis L97.422 Non-pressure chronic ulcer of left heel and midfoot with fat layer exposed 06/14/2022 No Yes T81.31XS Disruption of external operation (surgical) wound, not elsewhere classified, 06/14/2022 No Yes sequela I10 Essential (primary) hypertension 06/14/2022 No Yes Inactive Problems Resolved Problems Electronic Signature(s) Signed: 06/14/2022 8:42:39 AM By: [name] Maudlin MD FACS Previous Signature: 06/14/2022 8:02:26 AM Version By: [name] Maudlin MD FACS Entered By: [name] on 06/14/2022 08:42:39 -------------------------------------------------------------------------------- Progress Note Details Patient Name: Date of Service: [name], [name] 06/14/2022 8:00 A M Medical Record  Number: [id number] Patient Account Number: [id number] Date of Birth/Sex: Treating RN: 03/08/1956 (67 y.o. F) Primary Care Provider: Dorthy [name], [name] Other Clinician: Referring Provider: Treating Provider/Extender: [name], [name] Weeks in Treatment: 0 Subjective Chief Complaint Information obtained from Patient Patient presents to the wound care center with open non-healing surgical wound(s) History of Present Illness (HPI) ADMISSION 06/14/2022 This is a 67 year old otherwise quite healthy woman who underwent repair of the Haglund's deformity and ruptured Achilles tendon back in August 2023. The wound subsequently became infected and dehisced. She ended up hospitalized for the infection and has since been on multiple courses of oral antibiotics along with use of a wound VAC for a time then Betadine wet-to-dry dressing changes. [place]  sure that her wound was healed and she would be able to complete her move without difficulty. She has just been using triple antibiotic ointment and a Band-Aid at this point. On the back of her left heel, there is a small circular wound in the otherwise well-healed scar line. There is some dry eschar around the edges. The wound itself is just a couple of millimeters in diameter. No concern for infection. Patient History Allergies atenolol, lisinopril (Severity: Mild), Zoloft (Severity: Mild), elemental sulfur (Severity: Severe) Family History Diabetes - Maternal Grandparents,Mother, Heart Disease - Maternal Grandparents,Mother, Hypertension - Maternal Grandparents,Mother, Lung Disease - Father, Stroke - Mother,Maternal Grandparents, No family history of Cancer, Hereditary Spherocytosis, Kidney Disease, Seizures, Thyroid Problems, Tuberculosis. [name], [name] ([id number]) [id number] Page 5 of 9 Social History Never smoker, Marital Status - Widowed, Alcohol Use - Never, Drug Use - No History, Caffeine Use - Daily. Medical History Eyes Patient has history of Cataracts - bilateral Denies history of Glaucoma, Optic Neuritis Ear/Nose/Mouth/Throat Denies history of Chronic sinus problems/congestion, Middle ear problems Hematologic/Lymphatic Denies history of Anemia, Hemophilia, Human Immunodeficiency Virus, Lymphedema Respiratory Denies history of Aspiration, Asthma, Chronic Obstructive Pulmonary Disease (COPD), Pneumothorax, Sleep Apnea, Tuberculosis Cardiovascular Patient has history of Arrhythmia - sinus tachycardia, Hypertension Denies history of Hypotension, Myocardial Infarction, Peripheral Arterial Disease, Peripheral Venous Disease, Phlebitis, Vasculitis Gastrointestinal Denies history of Cirrhosis , Colitis, Crohnoos, Hepatitis A, Hepatitis B, Hepatitis C Genitourinary Denies history of End Stage Renal Disease Immunological Denies history of Lupus  Erythematosus, Raynaudoos, Scleroderma Integumentary (Skin) Denies history of History of Burn Musculoskeletal Patient has history of Osteoarthritis - bilateral knees Denies history of Gout, Rheumatoid Arthritis, Osteomyelitis Neurologic Denies history of Dementia, Neuropathy, Quadriplegia, Paraplegia, Seizure Disorder Psychiatric Denies history of Anorexia/bulimia, Confinement Anxiety Review of Systems (ROS) Constitutional Symptoms (General Health) Complains or has symptoms of Fever - sinus infection- resolved.. Denies complaints or symptoms of Fatigue, Chills, Marked Weight Change. Eyes Denies complaints or symptoms of Dry Eyes, Vision Changes, Glasses / Contacts. Ear/Nose/Mouth/Throat Denies complaints or symptoms of Chronic sinus problems or rhinitis. Respiratory Denies complaints or symptoms of Chronic or frequent coughs, Shortness of Breath. Cardiovascular Denies complaints or symptoms of Chest pain. Gastrointestinal Denies complaints or symptoms of Frequent diarrhea, Nausea, Vomiting. Genitourinary Denies complaints or symptoms of Frequent urination. Musculoskeletal Denies complaints or symptoms of Muscle Pain, Muscle Weakness. Neurologic Denies complaints or symptoms of Numbness/parasthesias. Psychiatric Denies complaints or symptoms of Claustrophobia. Objective Constitutional Tachycardic, asymptomatic. No acute distress. Vitals Time Taken: 8:03 AM, Height: 64 in, Source: Stated, Weight: 210 lbs, Source: Stated, BMI: 36, Temperature: 97.8 F, Pulse: 112 bpm, Respiratory Rate: 16 breaths/min, Blood Pressure: 138/83 mmHg. Respiratory Normal work of breathing on room air. General Notes:  06/14/2022: On the back of her left heel, there is a small circular wound in the otherwise well-healed scar line. There is some dry eschar around the edges. The wound itself is just a couple of millimeters in diameter. No concern for infection. Integumentary (Hair, Skin) Wound #1 status  is Open. Original cause of wound was Surgical Injury. The date acquired was: 12/26/2021. The wound is located on the Left Achilles. The wound measures 0.3cm length x 0.3cm width x 0.1cm depth; 0.071cm^2 area and 0.007cm^3 volume. There is no tunneling or undermining noted. There is a medium amount of serous drainage noted. There is large (67-100%) granulation within the wound bed. There is a small (1-33%) amount of necrotic tissue within the wound bed including Eschar and Adherent Slough. The periwound skin appearance exhibited: Scarring, Dry/Scaly, Rubor. The periwound skin appearance did not exhibit: Maceration. Periwound temperature was noted as No Abnormality. [name], [name] ([id number]) [id number] Page 6 of 9 Assessment Active Problems ICD-10 Non-pressure chronic ulcer of left heel and midfoot with fat layer exposed Disruption of external operation (surgical) wound, not elsewhere classified, sequela Essential (primary) hypertension Procedures Wound #1 Pre-procedure diagnosis of Wound #1 is a Dehisced Wound located on the Left Achilles . There was a Selective/Open Wound Non-Viable Tissue Debridement with a total area of 0.09 sq cm performed by [name] Maudlin, MD. With the following instrument(s): Curette to remove Non-Viable tissue/material. Material removed includes [facility] after achieving pain control using Lidocaine 4% Topical Solution. No specimens were taken. A time out was conducted at 08:36, prior to the start of the procedure. A Minimum amount of bleeding was controlled with Pressure. The procedure was tolerated well. Post Debridement Measurements: 0.3cm length x 0.3cm width x 0.1cm depth; 0.007cm^3 volume. Character of Wound/Ulcer Post Debridement is stable. Post procedure Diagnosis Wound #1: Same as Pre-Procedure General Notes: Scribed for Dr. Celine [name] by [name], RN. Plan Follow-up Appointments: Return Appointment in 1 week. - Dr. Celine [name] Rm  4 Anesthetic: Wound #1 Left Achilles: (In clinic) Topical Lidocaine 4% applied to wound bed Bathing/ Shower/ Hygiene: May shower and wash wound with soap and water. - Make sure to dry wound area really well Edema Control - Lymphedema / SCD / Other: Elevate legs to the level of the heart or above for 30 minutes daily and/or when sitting for 3-4 times a day throughout the day. Exercise regularly Moisturize legs daily. WOUND #1: - Achilles Wound Laterality: Left Cleanser: Soap and Water 1 x Per Day/30 Days Discharge Instructions: May shower and wash wound with dial antibacterial soap and water prior to dressing change. Cleanser: Byram Ancillary Kit - 15 Day Supply (DME) (Generic) 1 x Per Day/30 Days Discharge Instructions: Use supplies as instructed; Kit contains: (15) Saline Bullets; (15) 3x3 Gauze; 15 pr Gloves Peri-Wound Care: Sween Lotion (Moisturizing lotion) 1 x Per Day/30 Days Discharge Instructions: Apply moisturizing lotion as directed Prim Dressing: Promogran Prisma Matrix, 4.34 (sq in) (silver collagen) (DME) (Generic) 1 x Per Day/30 Days ary Discharge Instructions: Moisten collagen with saline or hydrogel Secondary Dressing: Zetuvit Plus Silicone Border Dressing 4x4 (in/in) (DME) (Generic) 1 x Per Day/30 Days Discharge Instructions: Apply silicone border over primary dressing as directed. This is a 67 year old woman who underwent excision of the Haglund's deformity and repair of ruptured Achilles tendon in August of last year. She has had significant challenges with infection and wound healing, but presents to clinic with a very much improved site. On the back of her left heel, there is a small circular  wound in the otherwise well-healed scar line. There is some dry eschar around the edges. The wound itself is just a couple of millimeters in diameter. No concern for infection. I used a curette to debride eschar from the wound. We will use Prisma silver collagen and a foam border  dressing. I anticipate she will likely heal within 2 to 3 weeks. Follow-up in 1 week's time. Electronic Signature(s) Signed: 06/14/2022 8:48:45 AM By: [name] Maudlin MD FACS Entered By: [name] on 06/14/2022 08:48:45 -------------------------------------------------------------------------------- HxROS Details Patient Name: Date of Service: [name], [place] [name]. 06/14/2022 8:00 A M Medical Record Number: [id number] Patient Account Number: [id number] [name], [name] ([id number]) [phone].pdf Page 7 of 9 Date of Birth/Sex: Treating RN: [birth year] (67 y.o. [name] Primary Care Provider: Dorthy [name], [name] Other Clinician: Referring Provider: Treating Provider/Extender: [name], [name] Weeks in Treatment: 0 Constitutional Symptoms (General Health) Complaints and Symptoms: Positive for: Fever - sinus infection- resolved. Negative for: Fatigue; Chills; Marked Weight Change Eyes Complaints and Symptoms: Negative for: Dry Eyes; Vision Changes; Glasses / Contacts Medical History: Positive for: Cataracts - bilateral Negative for: Glaucoma; Optic Neuritis Ear/Nose/Mouth/Throat Complaints and Symptoms: Negative for: Chronic sinus problems or rhinitis Medical History: Negative for: Chronic sinus problems/congestion; Middle ear problems Respiratory Complaints and Symptoms: Negative for: Chronic or frequent coughs; Shortness of Breath Medical History: Negative for: Aspiration; Asthma; Chronic Obstructive Pulmonary Disease (COPD); Pneumothorax; Sleep Apnea; Tuberculosis Cardiovascular Complaints and Symptoms: Negative for: Chest pain Medical History: Positive for: Arrhythmia - sinus tachycardia; Hypertension Negative for: Hypotension; Myocardial Infarction; Peripheral Arterial Disease; Peripheral Venous Disease; Phlebitis; Vasculitis Gastrointestinal Complaints and Symptoms: Negative for: Frequent diarrhea; Nausea; Vomiting Medical  History: Negative for: Cirrhosis ; Colitis; Crohns; Hepatitis A; Hepatitis B; Hepatitis C Genitourinary Complaints and Symptoms: Negative for: Frequent urination Medical History: Negative for: End Stage Renal Disease Musculoskeletal Complaints and Symptoms: Negative for: Muscle Pain; Muscle Weakness Medical History: Positive for: Osteoarthritis - bilateral knees Negative for: Gout; Rheumatoid Arthritis; Osteomyelitis Neurologic Complaints and Symptoms: Negative for: Numbness/parasthesias Medical History: Negative for: Dementia; Neuropathy; Quadriplegia; Paraplegia; Seizure Disorder Psychiatric Complaints and Symptoms: Negative for: [name], [name] ([id number]) [phone].pdf Page 8 of 9 Medical History: Negative for: Anorexia/bulimia; Confinement Anxiety Hematologic/Lymphatic Medical History: Negative for: Anemia; Hemophilia; Human Immunodeficiency Virus; Lymphedema Immunological Medical History: Negative for: Lupus Erythematosus; Raynauds; Scleroderma Integumentary (Skin) Medical History: Negative for: History of Burn Oncologic HBO Extended History Items Eyes: Cataracts Immunizations Pneumococcal Vaccine: Received Pneumococcal Vaccination: Yes Received Pneumococcal Vaccination On or After 60th Birthday: Yes Implantable Devices None Family and Social History Cancer: No; Diabetes: Yes - Maternal Grandparents,Mother; Heart Disease: Yes - Maternal Grandparents,Mother; Hereditary Spherocytosis: No; Hypertension: Yes - Maternal Grandparents,Mother; Kidney Disease: No; Lung Disease: Yes - Father; Seizures: No; Stroke: Yes - Mother,Maternal Grandparents; Thyroid Problems: No; Tuberculosis: No; Never smoker; Marital Status - Widowed; Alcohol Use: Never; Drug Use: No History; Caffeine Use: Daily; Financial Concerns: No; Food, Clothing or Shelter Needs: No; Support System Lacking: No; Transportation Concerns: No Physician Affirmation I  have reviewed and agree with the above information. Electronic Signature(s) Signed: 06/14/2022 10:33:46 AM By: [name] Maudlin MD FACS Signed: 06/14/2022 4:23:23 PM By: [name] RN Entered By: [name] on 06/14/2022 08:13:05 -------------------------------------------------------------------------------- SuperBill Details Patient Name: Date of Service: [facility] [name], [place]. 06/14/2022 Medical Record Number: [id number] Patient Account Number: [id number] Date of Birth/Sex: Treating RN: Jun 12, 1955 (67 y.o. [name] Primary Care Provider: Dorthy [name], [place] Other Clinician: Referring Provider: Treating Provider/Extender: [name], [name] Weeks in  Treatment: 0 Diagnosis Coding ICD-10 Codes Code Description [id number] Non-pressure chronic ulcer of left heel and midfoot with fat layer exposed T81.31XS Disruption of external operation (surgical) wound, not elsewhere classified, sequela I10 Essential (primary) hypertension Facility Procedures [name], [name] ([id number]): CPT4 Code Description [id number] [place] VISIT-LEV 3 EST PT [phone].pdf Page 9 of 9: Modifier Quantity 25 1 [name], [name] ([id number]): [id number] - DEBRIDE WOUND 1ST 20 SQ CM OR < ICD-10 Diagnosis Description L97.422 Non-pressure chronic ulcer of left heel and midfoot with fat layer [phone].pdf Page 9 of 9: 1 exposed Physician Procedures : CPT4 Code Description Modifier [id number] - WC PHYS LEVEL 4 - NEW PT 25 ICD-10 Diagnosis Description L97.422 Non-pressure chronic ulcer of left heel and midfoot with fat layer exposed T81.31XS Disruption of external operation (surgical) wound, not  elsewhere classified, sequela I10 Essential (primary) hypertension Quantity: 1 : [id number] - WC PHYS DEBR WO ANESTH 20 SQ CM ICD-10 Diagnosis Description L97.422 Non-pressure chronic ulcer of left heel and midfoot with fat layer  exposed Quantity: 1 Electronic Signature(s) Signed: 06/14/2022 8:49:00 AM By: [name] Maudlin MD FACS Entered By: [name] on 06/14/2022 08:48:59

## 2022-06-15 ENCOUNTER — Other Ambulatory Visit: Payer: Self-pay | Admitting: Podiatry

## 2022-06-15 MED ORDER — OXYCODONE-ACETAMINOPHEN 10-325 MG PO TABS: 1.0000 | ORAL_TABLET | ORAL | 0 refills | Status: DC | PRN

## 2022-06-15 NOTE — Progress Notes (Signed)
[name], [name] ([id number]) [phone].pdf Page 1 of 4 Visit Report for 06/14/2022 Abuse Risk Screen Details Patient Name: Date of Service: [name], [name] 06/14/2022 8:00 A M Medical Record Number: [id number] Patient Account Number: [id number] Date of Birth/Sex: Treating RN: 1955-10-02 (67 y.o. [name] Primary Care Chirsty Armistead: [name], [name] Other Clinician: Referring Wyllow Seigler: Treating Earle Burson/Extender: Adrian Prows, [name] Weeks in Treatment: 0 Abuse Risk Screen Items Answer ABUSE RISK SCREEN: Has anyone close to you tried to hurt or harm you recentlyo No Do you feel uncomfortable with anyone in your familyo No Has anyone forced you do things that you didnt want to doo No Electronic Signature(s) Signed: 06/14/2022 4:23:23 PM By: [name] RN Entered By: [name] on 06/14/2022 08:13:29 -------------------------------------------------------------------------------- Activities of Daily Living Details Patient Name: Date of Service: [name] Capitan, [name] 06/14/2022 8:00 A M Medical Record Number: [id number] Patient Account Number: [id number] Date of Birth/Sex: Treating RN: 1956-04-17 (67 y.o. [name] Primary Care Radek Carnero: [name], [name] Other Clinician: Referring Daric Koren: Treating Jacey Eckerson/Extender: Adrian Prows, [name] Weeks in Treatment: 0 Activities of Daily Living Items Answer Activities of Daily Living (Please select one for each item) Drive Automobile Completely Able T Medications ake Completely Able Use T elephone Completely Able Care for Appearance Completely Able Use T oilet Completely Able Bath / Shower Completely Able Dress Self Completely Able Feed Self Completely Able Walk Completely Able Get In / Out Bed Completely Able Housework Completely Able Prepare Meals Completely Able Handle Money Completely Able Shop for Self Completely Able Electronic Signature(s) Signed: 06/14/2022  4:23:23 PM By: [name] RN Entered By: [name] on 06/14/2022 08:13:55 Darsey, [name] ([id number]) [id number] Page 2 of 4 -------------------------------------------------------------------------------- Education Screening Details Patient Name: Date of Service: [facility] DES, [name] 06/14/2022 8:00 A M Medical Record Number: [id number] Patient Account Number: [id number] Date of Birth/Sex: Treating RN: [birth year] (67 y.o. [name] Primary Care Caralyn Twining: [name], [name] Other Clinician: Referring Ronal Maybury: Treating Kinsey Karch/Extender: Adrian Prows, [name] Weeks in Treatment: 0 Primary Learner Assessed: Patient Learning Preferences/Education Level/Primary Language Learning Preference: Explanation Highest Education Level: College or Above Preferred Language: English Cognitive Barrier Language Barrier: No Translator Needed: No Memory Deficit: No Emotional Barrier: No Cultural/Religious Beliefs Affecting Medical Care: No Physical Barrier Impaired Vision: No Impaired Hearing: No Decreased Hand dexterity: No Knowledge/Comprehension Knowledge Level: [name] Comprehension Level: [name] Ability to understand written instructions: [name] Ability to understand verbal instructions: [name] Motivation Anxiety Level: Calm Cooperation: Cooperative Education Importance: Acknowledges Need Interest in Health Problems: Asks Questions Perception: Coherent Willingness to Engage in Self-Management [name] Activities: Readiness to Engage in Self-Management [name] Activities: Electronic Signature(s) Signed: 06/14/2022 4:23:23 PM By: [name] RN Entered By: [name] on 06/14/2022 08:14:19 -------------------------------------------------------------------------------- Fall Risk Assessment Details Patient Name: Date of Service: [name] DES, [name] 06/14/2022 8:00 A M Medical Record Number: [id number] Patient Account Number: [id number] Date  of Birth/Sex: Treating RN: 06-13-1955 (67 y.o. [name] Primary Care Chisom Aust: [name], [place] Other Clinician: Referring Carnie Bruemmer: Treating [place] Antolin/Extender: Adrian Prows, [name] Weeks in Treatment: 0 Fall Risk Assessment Items Have you had 2 or more falls in the last 12 monthso 0 No Delahunty, Jimi K ([id number]) [phone] [id number] Page 3 of 4 Have you had any fall that resulted in injury in the last 12 monthso 0 No FALLS RISK SCREEN History of falling - immediate or within 3 months 0 No Secondary diagnosis (Do you have 2 or more medical diagnoseso) 0 No  Ambulatory aid None/bed rest/wheelchair/nurse 0 Yes Crutches/cane/walker 0 No Furniture 0 No Intravenous therapy Access/Saline/Heparin Lock 0 No Gait/Transferring Normal/ bed rest/ wheelchair 0 Yes Weak (short steps with or without shuffle, stooped but able to lift head while walking, may seek 0 No support from furniture) Impaired (short steps with shuffle, may have difficulty arising from chair, head down, impaired 0 No balance) Mental Status Oriented to own ability 0 Yes Electronic Signature(s) Signed: 06/14/2022 4:23:23 PM By: [name] RN Entered By: [name] on 06/14/2022 08:14:33 -------------------------------------------------------------------------------- Foot Assessment Details Patient Name: Date of Service: [name] DES, [name] 06/14/2022 8:00 A M Medical Record Number: [id number] Patient Account Number: [id number] Date of Birth/Sex: Treating RN: Aug 02, 1955 (67 y.o. [name] Primary Care Sem Mccaughey: [name], [name] Other Clinician: Referring Kele Withem: Treating Tayona Sarnowski/Extender: Adrian Prows, [name] Weeks in Treatment: 0 Foot Assessment Items Site Locations + = Sensation present, - = Sensation absent, C = Callus, U = Ulcer R = Redness, W = Warmth, M = Maceration, PU = Pre-ulcerative lesion F = Fissure, S = Swelling, D =  Dryness Assessment Right: Left: Other Deformity: No No Prior Foot Ulcer: No No Prior Amputation: No No Charcot Joint: No No Ambulatory Status: Ambulatory Without Help GaitMURDIS, HOLEMAN ([id number]) [phone] [id number] Page 4 of 4 Electronic Signature(s) Signed: 06/14/2022 4:23:23 PM By: [name] RN Entered By: [name] on 06/14/2022 08:17:53 -------------------------------------------------------------------------------- Nutrition Risk Screening Details Patient Name: Date of Service: [facility] DES, [name] 06/14/2022 8:00 A M Medical Record Number: [id number] Patient Account Number: [id number] Date of Birth/Sex: Treating RN: Jun 04, 1955 (67 y.o. [name] Primary Care Kieron Kantner: [name], [name] Other Clinician: Referring Abrey Bradway: Treating Aaminah Forrester/Extender: Adrian Prows, [name] Weeks in Treatment: 0 Height (in): 64 Weight (lbs): 210 Body Mass Index (BMI): 36 Nutrition Risk Screening Items Score Screening NUTRITION RISK SCREEN: I have an illness or condition that made me change the kind and/or amount of food I eat 0 No I eat fewer than two meals per day 0 No I eat few fruits and vegetables, or milk products 0 No I have three or more drinks of beer, liquor or wine almost every day 0 No I have tooth or mouth problems that make it hard for me to eat 0 No I don't always have enough money to buy the food I need 0 No I eat alone most of the time 0 No I take three or more different prescribed or over-the-counter drugs a day 1 Yes Without wanting to, I have lost or gained 10 pounds in the last six months 0 No I am not always physically able to shop, cook and/or feed myself 0 No Nutrition Protocols Good Risk Protocol 0 No interventions needed Moderate Risk Protocol [name] Risk Proctocol Risk Level: Good Risk Score: 1 Electronic Signature(s) Signed: 06/14/2022 4:23:23 PM By: [name] RN Entered By: [name] on  06/14/2022 08:15:31

## 2022-06-15 NOTE — Progress Notes (Signed)
SHARNISE, PORT ([id number] Page 1 of 8 Visit Report for 06/14/2022 Allergy List Details Patient Name: Date of Service: [name], [name] 06/14/2022 8:00 A M Medical Record Number: [id number] Patient Account Number: [id number] Date of Birth/Sex: Treating RN: 09/14/1955 (67 y.o. [name] Primary Care Binta Statzer: [name], [name] Other Clinician: Referring Syris Brookens: Treating Austin Pongratz/Extender: Adrian Prows, [name] Weeks in Treatment: 0 Allergies Active Allergies atenolol lisinopril Severity: Mild Zoloft Severity: Mild elemental sulfur Severity: Severe Type: Medication Allergy Notes Electronic Signature(s) Signed: 06/14/2022 4:23:23 PM By: [name] RN Entered By: [name] on 06/14/2022 08:06:11 -------------------------------------------------------------------------------- Arrival Information Details Patient Name: Date of Service: [facility] DES, [name] 06/14/2022 8:00 A M Medical Record Number: [id number] Patient Account Number: [id number] Date of Birth/Sex: Treating RN: March 10, 1956 (67 y.o. [name] Primary Care Celese Banner: [name], [name] Other Clinician: Referring Sidonia Nutter: Treating Chaz Mcglasson/Extender: Adrian Prows, [name] Weeks in Treatment: 0 Visit Information Patient Arrived: Ambulatory Arrival Time: 07:56 Accompanied By: self Transfer Assistance: None Patient Identification Verified: Yes Secondary Verification Process Completed: Yes Patient Requires Transmission-Based Precautions: No Patient Has Alerts: Yes Patient Alerts: ABI LLE 1.11 ABI RLE 1.04 Electronic Signature(s) Signed: 06/14/2022 4:23:23 PM By: [name] RN Entered By: [name] on 06/14/2022 08:03:47 Ferrufino, [name] ([id number] Page 2 of 8 -------------------------------------------------------------------------------- Clinic Level of Care Assessment Details Patient Name: Date  of Service: [place], [name] 06/14/2022 8:00 A M Medical Record Number: [id number] Patient Account Number: [id number] Date of Birth/Sex: Treating RN: 1955/10/12 (67 y.o. [name] Primary Care Staci Carver: [name], [name] Other Clinician: Referring Jayda White: Treating Jiyah Torpey/Extender: Adrian Prows, [name] Weeks in Treatment: 0 Clinic Level of Care Assessment Items TOOL 1 Quantity Score X- 1 0 Use when EandM and Procedure is performed on INITIAL visit ASSESSMENTS - Nursing Assessment / Reassessment X- 1 20 General Physical Exam (combine w/ comprehensive assessment (listed just below) when performed on new pt. evals) X- 1 25 Comprehensive Assessment (HX, ROS, Risk Assessments, Wounds Hx, etc.) ASSESSMENTS - Wound and Skin Assessment / Reassessment []$  - 0 Dermatologic / Skin Assessment (not related to wound area) ASSESSMENTS - Ostomy and/or Continence Assessment and Care []$  - 0 Incontinence Assessment and Management []$  - 0 Ostomy Care Assessment and Management (repouching, etc.) PROCESS - Coordination of Care X - Simple Patient / Family Education for ongoing care 1 15 []$  - 0 Complex (extensive) Patient / Family Education for ongoing care X- 1 10 Staff obtains Programmer, systems, Records, T Results / Process Orders est X- 1 10 Staff telephones HHA, Nursing Homes / Clarify orders / etc []$  - 0 Routine Transfer to another Facility (non-emergent condition) []$  - 0 Routine Hospital Admission (non-emergent condition) X- 1 15 New Admissions / Biomedical engineer / Ordering NPWT Apligraf, etc. , []$  - 0 Emergency Hospital Admission (emergent condition) PROCESS - Special Needs []$  - 0 Pediatric / Minor Patient Management []$  - 0 Isolation Patient Management []$  - 0 Hearing / Language / Visual special needs []$  - 0 Assessment of Community assistance (transportation, D/C planning, etc.) []$  - 0 Additional assistance / Altered mentation []$  - 0 Support Surface(s)  Assessment (bed, cushion, seat, etc.) INTERVENTIONS - Miscellaneous []$  - 0 External ear exam []$  - 0 Patient Transfer (multiple staff / Civil Service fast streamer / Similar devices) []$  - 0 Simple Staple / Suture removal (25 or less) []$  - 0 Complex Staple / Suture removal (26 or more) []$  - 0 Hypo/Hyperglycemic Management (do not check if billed separately) []$  -  0 Ankle / Brachial Index (ABI) - do not check if billed separately Has the patient been seen at the hospital within the last three years: Yes Total Score: 95 Level Of Care: New/Established - Level 3 Wagley, Ugochi K ([id number] Page 3 of 8 Electronic Signature(s) Signed: 06/14/2022 4:23:23 PM By: [name] RN Entered By: [name] on 06/14/2022 08:48:01 -------------------------------------------------------------------------------- Encounter Discharge Information Details Patient Name: Date of Service: [facility] DES, [name] 06/14/2022 8:00 A M Medical Record Number: [id number] Patient Account Number: [id number] Date of Birth/Sex: Treating RN: [birth year] (67 y.o. [name] Primary Care Arbor Leer: [name], [name] Other Clinician: Referring Sybel Standish: Treating Rayvon Brandvold/Extender: Adrian Prows, [name] Weeks in Treatment: 0 Encounter Discharge Information Items Post Procedure Vitals Discharge Condition: Stable Temperature (F): 97.8 Ambulatory Status: Ambulatory Pulse (bpm): 112 Discharge Destination: Home Respiratory Rate (breaths/min): 16 Transportation: Private Auto Blood Pressure (mmHg): 138/83 Accompanied By: self Schedule Follow-up Appointment: Yes Clinical Summary of Care: Electronic Signature(s) Signed: 06/14/2022 4:23:23 PM By: [name] RN Entered By: [name] on 06/14/2022 08:48:57 -------------------------------------------------------------------------------- Lower Extremity Assessment Details Patient Name: Date of Service: [facility] DES, [name] 06/14/2022  8:00 A M Medical Record Number: [id number] Patient Account Number: [id number] Date of Birth/Sex: Treating RN: 01-19-1956 (67 y.o. [name] Primary Care Rachele Lamaster: [name], [name] Other Clinician: Referring Jomes Giraldo: Treating Kanyia Heaslip/Extender: Adrian Prows, [name] Weeks in Treatment: 0 Edema Assessment Assessed: [Left: No] [Right: No] [Left: Edema] [Right: :] Calf Left: Right: Point of Measurement: From Medial Instep 41.2 cm Ankle Left: Right: Point of Measurement: From Medial Instep 25 cm Vascular Assessment Pulses: Dorsalis Pedis Palpable: [Left:Yes] Electronic Signature(s) Signed: 06/14/2022 4:23:23 PM By: [name] RN Cisse, Signed: 06/14/2022 4:23:23 PM By: [name] RN [name] ([id number] Page 4 of 8 Entered By: [name] on 06/14/2022 08:19:00 -------------------------------------------------------------------------------- Multi Wound Chart Details Patient Name: Date of Service: [facility] DES, [place] [name] 06/14/2022 8:00 A M Medical Record Number: [id number] Patient Account Number: [id number] Date of Birth/Sex: Treating RN: 03/02/1956 (67 y.o. F) Primary Care Lilianah Buffin: [name], [name] Other Clinician: Referring Clete Kuch: Treating Diondra Pines/Extender: Adrian Prows, [name] Weeks in Treatment: 0 Vital Signs Height(in): 64 Pulse(bpm): 112 Weight(lbs): 210 Blood Pressure(mmHg): 138/83 Body Mass Index(BMI): 36 Temperature(F): 97.8 Respiratory Rate(breaths/min): 16 [1:Photos:] [N/A:N/A] Left Achilles N/A N/A Wound Location: Surgical Injury N/A N/A Wounding Event: Dehisced Wound N/A N/A Primary Etiology: Cataracts, Arrhythmia, Hypertension, N/A N/A Comorbid History: Osteoarthritis 12/26/2021 N/A N/A Date Acquired: 0 N/A N/A Weeks of Treatment: Open N/A N/A Wound Status: No N/A N/A Wound Recurrence: 0.3x0.3x0.1 N/A N/A Measurements L x W x D (cm) 0.071 N/A N/A A (cm) : rea 0.007 N/A  N/A Volume (cm) : Full Thickness Without Exposed N/A N/A Classification: Support Structures Medium N/A N/A Exudate A mount: Serous N/A N/A Exudate Type: amber N/A N/A Exudate Color: Large (67-100%) N/A N/A Granulation A mount: Small (1-33%) N/A N/A Necrotic A mount: Eschar, Adherent Slough N/A N/A Necrotic Tissue: Small (1-33%) N/A N/A Epithelialization: Debridement - Selective/Open Wound N/A N/A Debridement: Pre-procedure Verification/Time Out 08:36 N/A N/A Taken: Lidocaine 4% Topical Solution N/A N/A Pain Control: Slough N/A N/A Tissue Debrided: Non-Viable Tissue N/A N/A Level: 0.09 N/A N/A Debridement A (sq cm): rea Curette N/A N/A Instrument: Minimum N/A N/A Bleeding: Pressure N/A N/A Hemostasis A chieved: Procedure was tolerated well N/A N/A Debridement Treatment Response: 0.3x0.3x0.1 N/A N/A Post Debridement Measurements L x W x D (cm) 0.007 N/A N/A Post Debridement Volume: (cm) Scarring: Yes N/A N/A Periwound Skin  Texture: Dry/Scaly: Yes N/A N/A Periwound Skin Moisture: Maceration: No Rubor: Yes N/A N/A Periwound Skin Color: No Abnormality N/A N/A Temperature: Debridement N/A N/A Procedures Performed: MARKEESHA, URBAEZ ([id number] Page 5 of 8 Treatment Notes Electronic Signature(s) Signed: 06/14/2022 8:43:27 AM By: Fredirick Maudlin MD FACS Entered By: Fredirick Maudlin on 06/14/2022 08:43:26 -------------------------------------------------------------------------------- Multi-Disciplinary Care Plan Details Patient Name: Date of Service: [facility] DES, [name] 06/14/2022 8:00 A M Medical Record Number: [id number] Patient Account Number: [id number] Date of Birth/Sex: Treating RN: Aug 19, 1955 (67 y.o. [name] Primary Care Yarel Rushlow: [name], [place] Other Clinician: Referring Jonel Sick: Treating Raden Byington/Extender: Adrian Prows, [name] Weeks in Treatment: 0 Active Inactive Electronic  Signature(s) Signed: 06/14/2022 4:23:23 PM By: [name] RN Entered By: [name] on 06/14/2022 08:46:29 -------------------------------------------------------------------------------- Pain Assessment Details Patient Name: Date of Service: [facility] DES, [name] 06/14/2022 8:00 A M Medical Record Number: [id number] Patient Account Number: [id number] Date of Birth/Sex: Treating RN: January 28, 1956 (67 y.o. [name] Primary Care Aasia Peavler: [name], [name] Other Clinician: Referring Sanoe Hazan: Treating Solomia Harrell/Extender: Adrian Prows, [name] Weeks in Treatment: 0 Active Problems Location of Pain Severity and Description of Pain Patient Has Paino No Site Locations Rate the pain. Current Pain Level: 0 Pain Management and Medication Current Pain Management: KISCHA, ERLE ([id number]) [id number] Page 6 of 8 Electronic Signature(s) Signed: 06/14/2022 4:23:23 PM By: [name] RN Entered By: [name] on 06/14/2022 08:23:52 -------------------------------------------------------------------------------- Patient/Caregiver Education Details Patient Name: Date of Service: [facility] DES, [name] 2/21/2024andnbsp8:00 A M Medical Record Number: [id number] Patient Account Number: [id number] Date of Birth/Gender: Treating RN: 1955-05-18 (67 y.o. [name] Primary Care Physician: [name], [place] Other Clinician: Referring Physician: Treating Physician/Extender: Adrian Prows, [name] Weeks in Treatment: 0 Education Assessment Education Provided To: Patient Education Topics Provided Welcome T [facility] Packet: o Handouts: The W[facility] Healing Pledge form, Welcome T [facility] [place] o Methods: Explain/Verbal Responses: Reinforcements needed, State content correctly Wound Debridement: Handouts: Wound Debridement Methods: Explain/Verbal Responses: Reinforcements needed, State content  correctly Wound/Skin Impairment: Handouts: Caring for Your Ulcer Methods: Explain/Verbal Responses: Reinforcements needed, State content correctly Electronic Signature(s) Signed: 06/14/2022 4:23:23 PM By: [name] RN Entered By: [name] on 06/14/2022 08:47:32 -------------------------------------------------------------------------------- Wound Assessment Details Patient Name: Date of Service: [facility] DES, [name] 06/14/2022 8:00 A M Medical Record Number: [id number] Patient Account Number: [id number] Date of Birth/Sex: Treating RN: Dec 01, 1955 (67 y.o. [name] Primary Care Jamarcus Laduke: [name], [name] Other Clinician: Referring Johngabriel Verde: Treating Ifeoluwa Beller/Extender: Adrian Prows, [name] Weeks in Treatment: 0 Wound Status Wound Number: 1 Primary Etiology: Dehisced Wound Wound Location: Left Achilles Wound Status: Open Wounding Event: Surgical Injury Comorbid History: Cataracts, Arrhythmia, Hypertension, Osteoarthritis Date Acquired: 12/26/2021 Weeks Of Treatment: 0 ARITHA, MOSTYN ([id number] Page 7 of 8 Clustered Wound: No Photos Wound Measurements Length: (cm) 0.3 Width: (cm) 0.3 Depth: (cm) 0.1 Area: (cm) 0.071 Volume: (cm) 0.007 % Reduction in Area: % Reduction in Volume: Epithelialization: Small (1-33%) Tunneling: No Undermining: No Wound Description Classification: Full Thickness Without Exposed Support Structures Exudate Amount: Medium Exudate Type: Serous Exudate Color: amber Foul Odor After Cleansing: No Slough/Fibrino Yes Wound Bed Granulation Amount: Large (67-100%) Necrotic Amount: Small (1-33%) Necrotic Quality: Eschar, Adherent Slough Periwound Skin Texture Texture Color No Abnormalities Noted: No No Abnormalities Noted: No Scarring: Yes Rubor: Yes Moisture Temperature / Pain No Abnormalities Noted: No Temperature: No Abnormality Dry / Scaly: Yes Maceration: No Treatment  Notes Wound #1 (Achilles) Wound  Laterality: Left Cleanser Soap and Water Discharge Instruction: May shower and wash wound with dial antibacterial soap and water prior to dressing change. [place]ram Ancillary Kit - 15 Day Supply Discharge Instruction: Use supplies as instructed; Kit contains: (15) Saline Bullets; (15) 3x3 Gauze; 15 pr Gloves Peri-Wound Care Sween Lotion (Moisturizing lotion) Discharge Instruction: Apply moisturizing lotion as directed Topical Primary Dressing Promogran Prisma Matrix, 4.34 (sq in) (silver collagen) Discharge Instruction: Moisten collagen with saline or hydrogel Secondary Dressing Zetuvit Plus Silicone Border Dressing 4x4 (in/in) Discharge Instruction: Apply silicone border over primary dressing as directed. Secured With Compression Wrap Compression Stockings Add-Ons GINNIFER, ONCALE ([id number]) [id number] Page 8 of 8 Electronic Signature(s) Signed: 06/14/2022 4:23:23 PM By: [name] RN Entered By: [name] on 06/14/2022 08:26:50 -------------------------------------------------------------------------------- Vitals Details Patient Name: Date of Service: [facility] DES, [name] 06/14/2022 8:00 A M Medical Record Number: [id number] Patient Account Number: [id number] Date of Birth/Sex: Treating RN: 1955/10/27 (67 y.o. [name] Primary Care Caitriona Sundquist: [name], [name] Other Clinician: Referring Aasiya Creasey: Treating Delta Pichon/Extender: Adrian Prows, [name] Weeks in Treatment: 0 Vital Signs Time Taken: 08:03 Temperature (F): 97.8 Height (in): 64 Pulse (bpm): 112 Source: Stated Respiratory Rate (breaths/min): 16 Weight (lbs): 210 Blood Pressure (mmHg): 138/83 Source: Stated Reference Range: 80 - 120 mg / dl Body Mass Index (BMI): 36 Electronic Signature(s) Signed: 06/14/2022 4:23:23 PM By: [name] RN Entered By: [name] on 06/14/2022 08:04:28

## 2022-06-19 ENCOUNTER — Encounter: Payer: Self-pay | Admitting: Cardiovascular Disease

## 2022-06-19 DIAGNOSIS — Z79899 Other long term (current) drug therapy: Secondary | ICD-10-CM

## 2022-06-19 DIAGNOSIS — I10 Essential (primary) hypertension: Secondary | ICD-10-CM

## 2022-06-20 MED ORDER — VALSARTAN-HYDROCHLOROTHIAZIDE 160-12.5 MG PO TABS: 1.0000 | ORAL_TABLET | Freq: Every day | ORAL | 3 refills | Status: DC

## 2022-06-21 ENCOUNTER — Encounter (HOSPITAL_BASED_OUTPATIENT_CLINIC_OR_DEPARTMENT_OTHER): Payer: Medicare PPO | Admitting: General Surgery

## 2022-06-23 ENCOUNTER — Encounter (HOSPITAL_BASED_OUTPATIENT_CLINIC_OR_DEPARTMENT_OTHER): Payer: Medicare PPO | Attending: General Surgery | Admitting: General Surgery

## 2022-06-23 DIAGNOSIS — L97422 Non-pressure chronic ulcer of left heel and midfoot with fat layer exposed: Secondary | ICD-10-CM | POA: Insufficient documentation

## 2022-06-23 DIAGNOSIS — T8131XS Disruption of external operation (surgical) wound, not elsewhere classified, sequela: Secondary | ICD-10-CM | POA: Insufficient documentation

## 2022-06-23 DIAGNOSIS — Z8249 Family history of ischemic heart disease and other diseases of the circulatory system: Secondary | ICD-10-CM | POA: Insufficient documentation

## 2022-06-23 DIAGNOSIS — I10 Essential (primary) hypertension: Secondary | ICD-10-CM | POA: Diagnosis not present

## 2022-06-23 DIAGNOSIS — X58XXXS Exposure to other specified factors, sequela: Secondary | ICD-10-CM | POA: Diagnosis not present

## 2022-06-26 NOTE — Progress Notes (Signed)
[name], [name] ([id number]) [id number] Page 1 of 8 Visit Report for 06/23/2022 Chief Complaint Document Details Patient Name: Date of Service: [name] [name], [name] 06/23/2022 2:00 PM Medical Record Number: [id number] Patient Account Number: [id number] Date of Birth/Sex: Treating RN: [birth year] (67 y.o. F) Primary Care Provider: Dorthy [name], [name] Other Clinician: Referring Provider: Treating Provider/Extender: [name], [name] in Treatment: 1 Information Obtained from: Patient Chief Complaint Patient presents to the wound care center with open non-healing surgical wound(s) Electronic Signature(s) Signed: 06/23/2022 2:40:15 PM By: [name] Maudlin MD FACS Entered By: [name] on 06/23/2022 14:40:14 -------------------------------------------------------------------------------- Debridement Details Patient Name: Date of Service: [facility] [name], [name]. 06/23/2022 2:00 PM Medical Record Number: [id number] Patient Account Number: [id number] Date of Birth/Sex: Treating RN: 04-14-1956 (67 y.o. [name] Primary Care Provider: Dorthy [name], [name] Other Clinician: Referring Provider: Treating Provider/Extender: [name], [name] in Treatment: 1 Debridement Performed for Assessment: Wound #1 Left Achilles Performed By: Physician [name] Maudlin, MD Debridement Type: Debridement Level of Consciousness (Pre-procedure): Awake and Alert Pre-procedure Verification/Time Out Yes - 14:16 Taken: Start Time: 14:17 Pain Control: Lidocaine 4% T opical Solution T Area Debrided (L x W): otal 0.3 (cm) x 0.3 (cm) = 0.09 (cm) Tissue and other material debrided: Non-Viable, Slough, Slough Level: Non-Viable Tissue Debridement Description: Selective/Open Wound Instrument: Curette Bleeding: Minimum Hemostasis Achieved: Pressure Response to Treatment: Procedure was tolerated well Level of Consciousness (Post- Awake and  Alert procedure): Post Debridement Measurements of Total Wound Length: (cm) 0.3 Width: (cm) 0.3 Depth: (cm) 0.1 Volume: (cm) 0.007 Character of Wound/Ulcer Post Debridement: Requires Further Debridement Post Procedure Diagnosis Same as Pre-procedure Notes Scribed for Dr. Celine [name] by [name] East, RN Electronic Signature(s) Signed: 06/23/2022 3:35:49 PM By: [name] Maudlin MD FACS Signed: 06/23/2022 4:09:38 PM By: [name] East RN Entered By: [name] on 06/23/2022 14:21:19 Mark, [name] ([id number].pdf Page 2 of 8 -------------------------------------------------------------------------------- HPI Details Patient Name: Date of Service: [name] [name], [name] 06/23/2022 2:00 PM Medical Record Number: [id number] Patient Account Number: [id number] Date of Birth/Sex: Treating RN: 01-02-1956 (67 y.o. F) Primary Care Provider: Dorthy [name], [name] Other Clinician: Referring Provider: Treating Provider/Extender: [name], [name] in Treatment: 1 History of Present Illness HPI Description: ADMISSION 06/14/2022 This is a 67 year old otherwise quite healthy woman who underwent repair of the Haglund's deformity and ruptured Achilles tendon back in August 2023. The wound subsequently became infected and dehisced. She ended up hospitalized for the infection and has since been on multiple courses of oral antibiotics along with use of a wound VAC for a time then Betadine wet-to-dry dressing changes. [place] sure that her wound was healed and she would be able to complete her move without difficulty. She has just been using triple antibiotic ointment and a Band-Aid at this point. On the back of her left heel, there is a small circular wound in the otherwise well-healed scar line. There is some dry eschar around the edges. The wound itself is just a couple of millimeters in diameter. No concern  for infection. 06/23/2022: The wound is basically unchanged in size today. There is some eschar and dried collagen accumulation. Electronic Signature(s) Signed: 06/23/2022 2:40:52 PM By: [name] Maudlin MD FACS Entered By: [name] on 06/23/2022 14:40:52 -------------------------------------------------------------------------------- Physical Exam Details Patient Name: Date of Service: [name], [name] 06/23/2022 2:00 PM Medical Record Number: [id number] Patient Account Number: [id number] Date of Birth/Sex: Treating  RN: 13-Apr-1956 (67 y.o. F) Primary Care Provider: Dorthy [name], [name] Other Clinician: Referring Provider: Treating Provider/Extender: [name], [name] in Treatment: 1 Constitutional . . . . no acute distress. Respiratory Normal work of breathing on room air. Notes 06/23/2022: The wound is basically unchanged in size today. There is some eschar and dried collagen accumulation. Electronic Signature(s) Signed: 06/23/2022 2:41:19 PM By: [name] Maudlin MD FACS Entered By: [name] on 06/23/2022 14:41:19 -------------------------------------------------------------------------------- Physician Orders Details Patient Name: Date of Service: [name] [name], [name] 06/23/2022 2:00 PM Medical Record Number: [id number] Patient Account Number: [id number] Date of Birth/Sex: Treating RN: [birth year] (67 y.o. [name] Primary Care Provider: Dorthy [name], [name] Other Clinician: Referring Provider: Treating Provider/Extender: [name], [name] in Treatment: 1 Verbal / Phone Orders: No Diagnosis Coding ICD-10 Coding Code Description [phone] Non-pressure chronic ulcer of left heel and midfoot with fat layer exposed T81.31XS Disruption of external operation (surgical) wound, not elsewhere classified, sequela [name], [name] ([id number].pdf Page 3 of 8 I10 Essential (primary) hypertension Follow-up  Appointments ppointment in 1 week. - Dr. Celine [name] Rm 4 Return A Friday 06/30/22 at 1:15PM Anesthetic Wound #1 Left Achilles (In clinic) Topical Lidocaine 4% applied to wound bed Bathing/ Shower/ Hygiene May shower and wash wound with soap and water. - Make sure to dry wound area really well Edema Control - Lymphedema / SCD / Other Left Lower Extremity Elevate legs to the level of the heart or above for 30 minutes daily and/or when sitting for 3-4 times a day throughout the day. Exercise regularly Moisturize legs daily. Wound Treatment Wound #1 - Achilles Wound Laterality: Left Cleanser: Soap and Water 1 x Per Day/30 Days Discharge Instructions: May shower and wash wound with dial antibacterial soap and water prior to dressing change. Cleanser: Byram Ancillary Kit - 15 Day Supply (Generic) 1 x Per Day/30 Days Discharge Instructions: Use supplies as instructed; Kit contains: (15) Saline Bullets; (15) 3x3 Gauze; 15 pr Gloves Peri-Wound Care: Sween Lotion (Moisturizing lotion) 1 x Per Day/30 Days Discharge Instructions: Apply moisturizing lotion as directed Prim Dressing: Promogran Prisma Matrix, 4.34 (sq in) (silver collagen) (Generic) 1 x Per Day/30 Days ary Discharge Instructions: Moisten collagen with saline or hydrogel Secondary Dressing: Zetuvit Plus Silicone Border Dressing 4x4 (in/in) (Generic) 1 x Per Day/30 Days Discharge Instructions: Apply silicone border over primary dressing as directed. Electronic Signature(s) Signed: 06/23/2022 3:35:49 PM By: [name] Maudlin MD FACS Entered By: [name] on 06/23/2022 14:41:29 -------------------------------------------------------------------------------- Problem List Details Patient Name: Date of Service: [facility] [name], [name] 06/23/2022 2:00 PM Medical Record Number: [id number] Patient Account Number: [id number] Date of Birth/Sex: Treating RN: February 24, 1956 (67 y.o. F) Primary Care Provider: Dorthy [name], [name] Other Clinician: Referring  Provider: Treating Provider/Extender: [name], [name] in Treatment: 1 Active Problems ICD-10 Encounter Code Description Active Date MDM Diagnosis L97.422 Non-pressure chronic ulcer of left heel and midfoot with fat layer exposed 06/14/2022 No Yes T81.31XS Disruption of external operation (surgical) wound, not elsewhere classified, 06/14/2022 No Yes sequela I10 Essential (primary) hypertension 06/14/2022 No Yes Inactive Problems Dutton, Kelise [name] ([id number].pdf Page 4 of 8 Resolved Problems Electronic Signature(s) Signed: 06/23/2022 2:37:40 PM By: [name] Maudlin MD FACS Entered By: [name] on 06/23/2022 14:37:39 -------------------------------------------------------------------------------- Progress Note Details Patient Name: Date of Service: [facility] [name], [name] 06/23/2022 2:00 PM Medical Record Number: [id number] Patient Account Number: [id number] Date of Birth/Sex: Treating RN: 1955/07/17 (67 y.o. F) Primary Care Provider: Dorthy [name], [name] Other Clinician: Referring Provider:  Treating Provider/Extender: [name], [name] in Treatment: 1 Subjective Chief Complaint Information obtained from Patient Patient presents to the wound care center with open non-healing surgical wound(s) History of Present Illness (HPI) ADMISSION 06/14/2022 This is a 67 year old otherwise quite healthy woman who underwent repair of the Haglund's deformity and ruptured Achilles tendon back in August 2023. The wound subsequently became infected and dehisced. She ended up hospitalized for the infection and has since been on multiple courses of oral antibiotics along with use of a wound VAC for a time then Betadine wet-to-dry dressing changes. [place] make sure that her wound was healed and she would be able to complete her move without difficulty. She has just been using  triple antibiotic ointment and a Band-Aid at this point. On the back of her left heel, there is a small circular wound in the otherwise well-healed scar line. There is some dry eschar around the edges. The wound itself is just a couple of millimeters in diameter. No concern for infection. 06/23/2022: The wound is basically unchanged in size today. There is some eschar and dried collagen accumulation. Patient History Family History Diabetes - Maternal Grandparents,Mother, Heart Disease - Maternal Grandparents,Mother, Hypertension - Maternal Grandparents,Mother, Lung Disease - Father, Stroke - Mother,Maternal Grandparents, No family history of Cancer, Hereditary Spherocytosis, Kidney Disease, Seizures, Thyroid Problems, Tuberculosis. Social History Never smoker, Marital Status - Widowed, Alcohol Use - Never, Drug Use - No History, Caffeine Use - Daily. Medical History Eyes Patient has history of Cataracts - bilateral Denies history of Glaucoma, Optic Neuritis Ear/Nose/Mouth/Throat Denies history of Chronic sinus problems/congestion, Middle ear problems Hematologic/Lymphatic Denies history of Anemia, Hemophilia, Human Immunodeficiency Virus, Lymphedema Respiratory Denies history of Aspiration, Asthma, Chronic Obstructive Pulmonary Disease (COPD), Pneumothorax, Sleep Apnea, Tuberculosis Cardiovascular Patient has history of Arrhythmia - sinus tachycardia, Hypertension Denies history of Hypotension, Myocardial Infarction, Peripheral Arterial Disease, Peripheral Venous Disease, Phlebitis, Vasculitis Gastrointestinal Denies history of Cirrhosis , Colitis, Crohnoos, Hepatitis A, Hepatitis B, Hepatitis C Genitourinary Denies history of End Stage Renal Disease Immunological Denies history of Lupus Erythematosus, Raynaudoos, Scleroderma Integumentary (Skin) Denies history of History of Burn Musculoskeletal Patient has history of Osteoarthritis - bilateral knees Denies history of Gout,  Rheumatoid Arthritis, Osteomyelitis Neurologic Denies history of Dementia, Neuropathy, Quadriplegia, Paraplegia, Seizure Disorder Psychiatric Denies history of Anorexia/bulimia, Confinement Anxiety [name], [name] ([id number]) [phone].pdf Page 5 of 8 Objective Constitutional no acute distress. Vitals Time Taken: 2:00 PM, Height: 64 in, Weight: 210 lbs, BMI: 36, Temperature: 98.1 F, Pulse: 74 bpm, Respiratory Rate: 16 breaths/min, Blood Pressure: 139/85 mmHg. Respiratory Normal work of breathing on room air. General Notes: 06/23/2022: The wound is basically unchanged in size today. There is some eschar and dried collagen accumulation. Integumentary (Hair, Skin) Wound #1 status is Open. Original cause of wound was Surgical Injury. The date acquired was: 12/26/2021. The wound has been in treatment 1 [name]. The wound is located on the Left Achilles. The wound measures 0.3cm length x 0.3cm width x 0.1cm depth; 0.071cm^2 area and 0.007cm^3 volume. There is Fat Layer (Subcutaneous Tissue) exposed. There is no tunneling or undermining noted. There is a medium amount of serous drainage noted. There is large (67-100%) granulation within the wound bed. There is a small (1-33%) amount of necrotic tissue within the wound bed including Eschar and Adherent Slough. The periwound skin appearance exhibited: Scarring, Dry/Scaly, Rubor. The periwound skin appearance did not exhibit: Maceration. Periwound temperature was noted as  No Abnormality. Assessment Active Problems ICD-10 Non-pressure chronic ulcer of left heel and midfoot with fat layer exposed Disruption of external operation (surgical) wound, not elsewhere classified, sequela Essential (primary) hypertension Procedures Wound #1 Pre-procedure diagnosis of Wound #1 is a Dehisced Wound located on the Left Achilles . There was a Selective/Open Wound Non-Viable Tissue Debridement with a total area of 0.09 sq cm performed by  [name] Maudlin, MD. With the following instrument(s): Curette to remove Non-Viable tissue/material. Material removed includes [name] after achieving pain control using Lidocaine 4% Topical Solution. No specimens were taken. A time out was conducted at 14:16, prior to the start of the procedure. A Minimum amount of bleeding was controlled with Pressure. The procedure was tolerated well. Post Debridement Measurements: 0.3cm length x 0.3cm width x 0.1cm depth; 0.007cm^3 volume. Character of Wound/Ulcer Post Debridement requires further debridement. Post procedure Diagnosis Wound #1: Same as Pre-Procedure General Notes: Scribed for Dr. Celine [name] by [name] East, RN. Plan Follow-up Appointments: Return Appointment in 1 week. - Dr. Celine [name] Rm 4 Friday 06/30/22 at 1:15PM Anesthetic: Wound #1 Left Achilles: (In clinic) Topical Lidocaine 4% applied to wound bed Bathing/ Shower/ Hygiene: May shower and wash wound with soap and water. - Make sure to dry wound area really well Edema Control - Lymphedema / SCD / Other: Elevate legs to the level of the heart or above for 30 minutes daily and/or when sitting for 3-4 times a day throughout the day. Exercise regularly Moisturize legs daily. WOUND #1: - Achilles Wound Laterality: Left Cleanser: Soap and Water 1 x Per Day/30 Days Discharge Instructions: May shower and wash wound with dial antibacterial soap and water prior to dressing change. Cleanser: Byram Ancillary Kit - 15 Day Supply (Generic) 1 x Per Day/30 Days Discharge Instructions: Use supplies as instructed; Kit contains: (15) Saline Bullets; (15) 3x3 Gauze; 15 pr Gloves Peri-Wound Care: Sween Lotion (Moisturizing lotion) 1 x Per Day/30 Days Discharge Instructions: Apply moisturizing lotion as directed Prim Dressing: Promogran Prisma Matrix, 4.34 (sq in) (silver collagen) (Generic) 1 x Per Day/30 Days ary Discharge Instructions: Moisten collagen with saline or hydrogel Secondary Dressing: Zetuvit  Plus Silicone Border Dressing 4x4 (in/in) (Generic) 1 x Per Day/30 Days Discharge Instructions: Apply silicone border over primary dressing as directed. 06/23/2022: The wound is basically unchanged in size today. There is some eschar and dried collagen accumulation. [name], [name] ([id number]) [id number] Page 6 of 8 I used a curette to debride the eschar, collagen, and a small amount of slough from her wound. We will continue to use Prisma silver collagen and a foam border dressing. Follow-up in 1 week. Electronic Signature(s) Signed: 06/23/2022 2:41:55 PM By: [name] Maudlin MD FACS Entered By: [name] on 06/23/2022 14:41:55 -------------------------------------------------------------------------------- HxROS Details Patient Name: Date of Service: [name], [name] 06/23/2022 2:00 PM Medical Record Number: [id number] Patient Account Number: [id number] Date of Birth/Sex: Treating RN: April 14, 1956 (67 y.o. F) Primary Care Provider: Dorthy [name], [name] Other Clinician: Referring Provider: Treating Provider/Extender: [name], [name] in Treatment: 1 Eyes Medical History: Positive for: Cataracts - bilateral Negative for: Glaucoma; Optic Neuritis Ear/Nose/Mouth/Throat Medical History: Negative for: Chronic sinus problems/congestion; Middle ear problems Hematologic/Lymphatic Medical History: Negative for: Anemia; Hemophilia; Human Immunodeficiency Virus; Lymphedema Respiratory Medical History: Negative for: Aspiration; Asthma; Chronic Obstructive Pulmonary Disease (COPD); Pneumothorax; Sleep Apnea; Tuberculosis Cardiovascular Medical History: Positive for: Arrhythmia - sinus tachycardia; Hypertension Negative for: Hypotension; Myocardial Infarction; Peripheral Arterial Disease; Peripheral Venous Disease; Phlebitis; Vasculitis Gastrointestinal Medical History: Negative for: Cirrhosis ; Colitis;  Crohns; Hepatitis A; Hepatitis B;  Hepatitis C Genitourinary Medical History: Negative for: End Stage Renal Disease Immunological Medical History: Negative for: Lupus Erythematosus; Raynauds; Scleroderma Integumentary (Skin) Medical History: Negative for: History of Burn Musculoskeletal Medical History: Positive for: Osteoarthritis - bilateral knees Negative for: Gout; Rheumatoid Arthritis; Osteomyelitis Neurologic Medical HistorySHAKEYRA, JONG ([id number]) [phone].pdf Page 7 of 8 Negative for: Dementia; Neuropathy; Quadriplegia; Paraplegia; Seizure Disorder Psychiatric Medical History: Negative for: Anorexia/bulimia; Confinement Anxiety HBO Extended History Items Eyes: Cataracts Immunizations Pneumococcal Vaccine: Received Pneumococcal Vaccination: Yes Received Pneumococcal Vaccination On or After 60th Birthday: Yes Implantable Devices None Family and Social History Cancer: No; Diabetes: Yes - Maternal Grandparents,Mother; Heart Disease: Yes - Maternal Grandparents,Mother; Hereditary Spherocytosis: No; Hypertension: Yes - Maternal Grandparents,Mother; Kidney Disease: No; Lung Disease: Yes - Father; Seizures: No; Stroke: Yes - Mother,Maternal Grandparents; Thyroid Problems: No; Tuberculosis: No; Never smoker; Marital Status - Widowed; Alcohol Use: Never; Drug Use: No History; Caffeine Use: Daily; Financial Concerns: No; Food, Clothing or Shelter Needs: No; Support System Lacking: No; Transportation Concerns: No Engineer, maintenance) Signed: 06/23/2022 3:35:49 PM By: [name] Maudlin MD FACS Entered By: [name] on 06/23/2022 14:40:58 -------------------------------------------------------------------------------- SuperBill Details Patient Name: Date of Service: [name] [place]. 06/23/2022 Medical Record Number: [id number] Patient Account Number: [id number] Date of Birth/Sex: Treating RN: 1955/05/31 (67 y.o. F) Primary Care Provider: Dorthy [name], [name] Other  Clinician: Referring Provider: Treating Provider/Extender: [name], [name] in Treatment: 1 Diagnosis Coding ICD-10 Codes Code Description [phone] Non-pressure chronic ulcer of left heel and midfoot with fat layer exposed T81.31XS Disruption of external operation (surgical) wound, not elsewhere classified, sequela I10 Essential (primary) hypertension Facility Procedures : CPT4 Code: [id number] Description: [id number] - DEBRIDE WOUND 1ST 20 SQ CM OR < ICD-10 Diagnosis Description L97.422 Non-pressure chronic ulcer of left heel and midfoot with fat layer exposed Modifier: Quantity: 1 Physician Procedures : CPT4 Code Description Modifier [id number] - WC PHYS LEVEL 3 - EST PT 25 ICD-10 Diagnosis Description L97.422 Non-pressure chronic ulcer of left heel and midfoot with fat layer exposed T81.31XS Disruption of external operation (surgical) wound, not  elsewhere classified, sequela I10 Essential (primary) hypertension Quantity: 1 Electronic Signature(s) Signed: 06/23/2022 2:42:12 PM By: [name] Maudlin MD FACS Entered By: [name] on 06/23/2022 14:42:12

## 2022-06-30 ENCOUNTER — Encounter (HOSPITAL_BASED_OUTPATIENT_CLINIC_OR_DEPARTMENT_OTHER): Payer: Medicare PPO | Admitting: General Surgery

## 2022-06-30 DIAGNOSIS — T8131XS Disruption of external operation (surgical) wound, not elsewhere classified, sequela: Secondary | ICD-10-CM | POA: Diagnosis not present

## 2022-06-30 DIAGNOSIS — L97422 Non-pressure chronic ulcer of left heel and midfoot with fat layer exposed: Secondary | ICD-10-CM | POA: Diagnosis not present

## 2022-06-30 NOTE — Progress Notes (Signed)
ZAMYRA, RISK ([id number]) [id number] Page 1 of 7 Visit Report for 06/23/2022 Arrival Information Details Patient Name: Date of Service: [facility] DES, [name] 06/23/2022 2:00 PM Medical Record Number: [id number] Patient Account Number: [id number] Date of Birth/Sex: Treating RN: 09-05-1955 (67 y.o. F) Primary Care [name]: [name], [name] Other Clinician: Referring [name]: Treating [name]/Extender: [name], [name] in Treatment: 1 Visit Information History Since Last Visit Added or deleted any medications: No Patient Arrived: Ambulatory Any new allergies or adverse reactions: No Arrival Time: 14:00 Had a fall or experienced change in No Accompanied By: self activities of daily living that may affect Transfer Assistance: None risk of falls: Patient Identification Verified: Yes Signs or symptoms of abuse/neglect since last visito No Secondary Verification Process Completed: Yes Hospitalized since last visit: No Patient Requires Transmission-Based Precautions: No Implantable device outside of the clinic excluding No Patient Has Alerts: Yes cellular tissue based products placed in the center Patient Alerts: ABI LLE 1.11 since last visit: ABI RLE 1.04 Pain Present Now: Yes Electronic Signature(s) Signed: 06/28/2022 3:26:38 PM By: [name] Entered By: [name] on 06/23/2022 14:00:35 -------------------------------------------------------------------------------- Encounter Discharge Information Details Patient Name: Date of Service: [facility] DES, [place] [name]. 06/23/2022 2:00 PM Medical Record Number: [id number] Patient Account Number: [id number] Date of Birth/Sex: Treating RN: [birth year] (67 y.o. [name] Primary Care Dontrez Pettis: [name], [name] Other Clinician: Referring [name]: Treating [name]/Extender: [name], [name] in Treatment: 1 Encounter Discharge Information Items Post Procedure  Vitals Discharge Condition: Stable Temperature (F): 98.1 Ambulatory Status: Ambulatory Pulse (bpm): 74 Discharge Destination: Home Respiratory Rate (breaths/min): 16 Transportation: Private Auto Blood Pressure (mmHg): 139/85 Accompanied By: self Schedule Follow-up Appointment: No Clinical Summary of Care: Electronic Signature(s) Signed: 06/23/2022 4:09:38 PM By: [name] East RN Entered By: [name] on 06/23/2022 14:22:27 -------------------------------------------------------------------------------- Lower Extremity Assessment Details Patient Name: Date of Service: [facility] DES, [place] [name]. 06/23/2022 2:00 PM Medical Record Number: [id number] Patient Account Number: [id number] Date of Birth/Sex: Treating RN: [birth year] (67 y.o. [name] Primary Care [name]: [name], [name] Other Clinician: Referring [name]: Treating [name]/Extender: [name], [name] in Treatment: 1 Edema Assessment Assessed: [Left: No] [Right: No] [Left: Edema] [name]: :] R[LeftSHAUNTI, [name] [id number] [[id number] Page 2 of 7] Calf Left: Right: Point of Measurement: From Medial Instep 40 cm Ankle Left: Right: Point of Measurement: From Medial Instep 24 cm Vascular Assessment Pulses: Dorsalis Pedis Palpable: [Left:Yes] Electronic Signature(s) Signed: 06/23/2022 4:09:38 PM By: [name] East RN Entered By: [name] on 06/23/2022 14:09:14 -------------------------------------------------------------------------------- Multi Wound Chart Details Patient Name: Date of Service: [facility] DES, [place] [name]. 06/23/2022 2:00 PM Medical Record Number: [id number] Patient Account Number: [id number] Date of Birth/Sex: Treating RN: 1955-10-09 (67 y.o. F) Primary Care [name]: [name], [name] Other Clinician: Referring [name]: Treating [name]/Extender: [name], [name] in Treatment: 1 Vital Signs Height(in): 64 Pulse(bpm):  74 Weight(lbs): 210 Blood Pressure(mmHg): 139/85 Body Mass Index(BMI): 36 Temperature(F): 98.1 Respiratory Rate(breaths/min): 16 [1:Photos:] [N/A:N/A] Left Achilles N/A N/A Wound Location: Surgical Injury N/A N/A Wounding Event: Dehisced Wound N/A N/A Primary Etiology: Cataracts, Arrhythmia, Hypertension, N/A N/A Comorbid History: Osteoarthritis 12/26/2021 N/A N/A Date Acquired: 1 N/A N/A [name] of Treatment: Open N/A N/A Wound Status: No N/A N/A Wound Recurrence: 0.3x0.3x0.1 N/A N/A Measurements L x W x D (cm) 0.071 N/A N/A A (cm) : rea 0.007 N/A N/A Volume (cm) : 0.00% N/A N/A % Reduction in Area: 0.00% N/A N/A % Reduction in Volume: Full Thickness Without Exposed  N/A N/A Classification: Support Structures Medium N/A N/A Exudate A mount: Serous N/A N/A Exudate Type: amber N/A N/A Exudate Color: Large (67-100%) N/A N/A Granulation Amount: Small (1-33%) N/A N/A Necrotic Amount: Eschar, Adherent Slough N/A N/A Necrotic Tissue: Fat Layer (Subcutaneous Tissue): Yes N/A N/A Exposed Structures: Fascia: No Tendon: No Muscle: No Joint: No [name] ([id number].pdf Page 3 of 7 Bone: No Small (1-33%) N/A N/A Epithelialization: Debridement - Selective/Open Wound N/A N/A Debridement: 14:16 N/A N/A Pre-procedure Verification/Time Out Taken: Lidocaine 4% Topical Solution N/A N/A Pain Control: Slough N/A N/A Tissue Debrided: Non-Viable Tissue N/A N/A Level: 0.09 N/A N/A Debridement A (sq cm): rea Curette N/A N/A Instrument: Minimum N/A N/A Bleeding: Pressure N/A N/A Hemostasis A chieved: Procedure was tolerated well N/A N/A Debridement Treatment Response: 0.3x0.3x0.1 N/A N/A Post Debridement Measurements L x W x D (cm) 0.007 N/A N/A Post Debridement Volume: (cm) Scarring: Yes N/A N/A Periwound Skin Texture: Dry/Scaly: Yes N/A N/A Periwound Skin Moisture: Maceration: No Rubor: Yes N/A  N/A Periwound Skin Color: No Abnormality N/A N/A Temperature: Debridement N/A N/A Procedures Performed: Treatment Notes Wound #1 (Achilles) Wound Laterality: Left Cleanser Soap and Water Discharge Instruction: May shower and wash wound with dial antibacterial soap and water prior to dressing change. Byram Ancillary Kit - 15 Day Supply Discharge Instruction: Use supplies as instructed; Kit contains: (15) Saline Bullets; (15) 3x3 Gauze; 15 pr Gloves Peri-Wound Care Sween Lotion (Moisturizing lotion) Discharge Instruction: Apply moisturizing lotion as directed Topical Primary Dressing Promogran Prisma Matrix, 4.34 (sq in) (silver collagen) Discharge Instruction: Moisten collagen with saline or hydrogel Secondary Dressing Zetuvit Plus Silicone Border Dressing 4x4 (in/in) Discharge Instruction: Apply silicone border over primary dressing as directed. Secured With Compression Wrap Compression Stockings Environmental education officer) Signed: 06/23/2022 2:37:48 PM By: Fredirick Maudlin MD FACS Entered By: Fredirick Maudlin on 06/23/2022 14:37:47 -------------------------------------------------------------------------------- Multi-Disciplinary Care Plan Details Patient Name: Date of Service: [facility] DES, [place] [name]. 06/23/2022 2:00 PM Medical Record Number: [id number] Patient Account Number: [id number] Date of Birth/Sex: Treating RN: [birth year] (67 y.o. [name] Primary Care Emersen Mascari: [name], [place] Other Clinician: Referring Neylan Koroma: Treating Hridaan Bouse/Extender: [name], [name] in Treatment: 1 Active Inactive Wound/Skin Impairment [name], [name] ([id number]) [id number] Page 4 of 7 Nursing Diagnoses: Impaired tissue integrity Goals: Ulcer/skin breakdown will have a volume reduction of 30% by week 4 Date Initiated: 06/23/2022 Target Resolution Date: 06/23/2022 Goal Status: Active Interventions: Assess ulceration(s) every  visit Provide education on ulcer and skin care Treatment Activities: Skin care regimen initiated : 06/23/2022 Notes: Electronic Signature(s) Signed: 06/23/2022 4:09:38 PM By: [name] East RN Entered By: [name] on 06/23/2022 14:13:39 -------------------------------------------------------------------------------- Pain Assessment Details Patient Name: Date of Service: [facility] DES, [place] [name]. 06/23/2022 2:00 PM Medical Record Number: [id number] Patient Account Number: [id number] Date of Birth/Sex: Treating RN: [birth year] (67 y.o. F) Primary Care Kersti Scavone: [name], [name] Other Clinician: Referring Othal Kubitz: Treating Michie Molnar/Extender: [name], [name] in Treatment: 1 Active Problems Location of Pain Severity and Description of Pain Patient Has Paino Yes Site Locations Rate the pain. Current Pain Level: 3 Pain Management and Medication Current Pain Management: Electronic Signature(s) Signed: 06/28/2022 3:26:38 PM By: [name] Entered By: [name] on 06/23/2022 14:01:54 -------------------------------------------------------------------------------- Patient/Caregiver Education Details Patient Name: Date of Service: [facility], [place] [name] 3/1/2024andnbsp2:00 PM Medical Record Number: [id number] Patient Account Number: [id number] Date of Birth/Gender: Treating RN: 03-Feb-1956 (67 y.o. [name] Primary Care Physician: [name], [place] Other Clinician: Referring Physician: Treating Physician/Extender: Celine Ahr  [address], [place], [place] ([id number]) [id number] Page 5 of 7 [name] in Treatment: 1 Education Assessment Education Provided To: Patient Education Topics Provided Wound Debridement: Methods: Explain/Verbal Responses: Reinforcements needed, State content correctly Wound/Skin Impairment: Methods: Explain/Verbal Responses: Reinforcements needed, State content correctly Electronic  Signature(s) Signed: 06/23/2022 4:09:38 PM By: [name] East RN Entered By: [name] on 06/23/2022 14:13:57 -------------------------------------------------------------------------------- Wound Assessment Details Patient Name: Date of Service: [facility] DES, [place] [name]. 06/23/2022 2:00 PM Medical Record Number: [id number] Patient Account Number: [id number] Date of Birth/Sex: Treating RN: [birth year] (67 y.o. F) Primary Care Samay Delcarlo: [name], [name] Other Clinician: Referring Cristhian Vanhook: Treating Makhiya Coburn/Extender: [name], [name] in Treatment: 1 Wound Status Wound Number: 1 Primary Etiology: Dehisced Wound Wound Location: Left Achilles Wound Status: Open Wounding Event: Surgical Injury Comorbid History: Cataracts, Arrhythmia, Hypertension, Osteoarthritis Date Acquired: 12/26/2021 [name] Of Treatment: 1 Clustered Wound: No Photos Wound Measurements Length: (cm) 0.3 Width: (cm) 0.3 Depth: (cm) 0.1 Area: (cm) 0.071 Volume: (cm) 0.007 % Reduction in Area: 0% % Reduction in Volume: 0% Epithelialization: Small (1-33%) Tunneling: No Undermining: No Wound Description Classification: Full Thickness Without Exposed Support Exudate Amount: Medium Exudate Type: Serous Exudate Color: amber Structures Foul Odor After Cleansing: No Slough/Fibrino Yes Wound Bed Granulation Amount: Large (67-100%) Exposed Structure Necrotic Amount: Small (1-33%) Fascia Exposed: No Kutzer, [name] ([id number] Page 6 of 7 Necrotic Quality: Eschar, Adherent Slough Fat Layer (Subcutaneous Tissue) Exposed: Yes Tendon Exposed: No Muscle Exposed: No Joint Exposed: No Bone Exposed: No Periwound Skin Texture Texture Color No Abnormalities Noted: No No Abnormalities Noted: No Scarring: Yes Rubor: Yes Moisture Temperature / Pain No Abnormalities Noted: No Temperature: No Abnormality Dry / Scaly: Yes Maceration: No Treatment Notes Wound #1  (Achilles) Wound Laterality: Left Cleanser Soap and Water Discharge Instruction: May shower and wash wound with dial antibacterial soap and water prior to dressing change. Byram Ancillary Kit - 15 Day Supply Discharge Instruction: Use supplies as instructed; Kit contains: (15) Saline Bullets; (15) 3x3 Gauze; 15 pr Gloves Peri-Wound Care Sween Lotion (Moisturizing lotion) Discharge Instruction: Apply moisturizing lotion as directed Topical Primary Dressing Promogran Prisma Matrix, 4.34 (sq in) (silver collagen) Discharge Instruction: Moisten collagen with saline or hydrogel Secondary Dressing Zetuvit Plus Silicone Border Dressing 4x4 (in/in) Discharge Instruction: Apply silicone border over primary dressing as directed. Secured With Compression Wrap Compression Stockings Environmental education officer) Signed: 06/23/2022 4:09:38 PM By: [name] East RN Entered By: [name] on 06/23/2022 14:12:07 -------------------------------------------------------------------------------- Vitals Details Patient Name: Date of Service: [facility] DES, DA [name]. 06/23/2022 2:00 PM Medical Record Number: [id number] Patient Account Number: [id number] Date of Birth/Sex: Treating RN: 15-Dec-1955 (67 y.o. F) Primary Care Devon Pretty: [name], [name] Other Clinician: Referring Fanchon Papania: Treating Staphanie Harbison/Extender: [name], [name] in Treatment: 1 Vital Signs Time Taken: 14:00 Temperature (F): 98.1 Height (in): 64 Pulse (bpm): 74 Weight (lbs): 210 Respiratory Rate (breaths/min): 16 Body Mass Index (BMI): 36 Blood Pressure (mmHg): 139/85 Reference Range: 80 - 120 mg / dl Electronic Signature(s) Signed: 06/28/2022 3:26:38 PM By: [name] Entered By: [name] on 06/23/2022 14:01:46 Frentz, [name] ([id number] Page 7 of 7

## 2022-07-02 NOTE — Progress Notes (Signed)
[name], [name] ([id number]) [id number] Page 1 of 8 Visit Report for 06/30/2022 Chief Complaint Document Details Patient Name: Date of Service: [name], [place] 06/30/2022 1:15 PM Medical Record Number: [id number] Patient Account Number: [id number] Date of Birth/Sex: Treating RN: November 20, 1955 (67 y.o. F) Primary Care Provider: Dorthy [name], [name] Other Clinician: Referring Provider: Treating Provider/Extender: [name], [name] in Treatment: 2 Information Obtained from: Patient Chief Complaint Patient presents to the wound care center with open non-healing surgical wound(s) Electronic Signature(s) Signed: 06/30/2022 1:56:30 PM By: [name] Maudlin MD FACS Entered By: [name] on 06/30/2022 13:56:30 -------------------------------------------------------------------------------- Debridement Details Patient Name: Date of Service: [name], [name] 06/30/2022 1:15 PM Medical Record Number: [id number] Patient Account Number: [id number] Date of Birth/Sex: Treating RN: 01-22-1956 (67 y.o. [place] [name], [name] Primary Care Provider: Dorthy [name], [name] Other Clinician: Referring Provider: Treating Provider/Extender: [name], [name] in Treatment: 2 Debridement Performed for Assessment: Wound #1 Left Achilles Performed By: Physician [name] Maudlin, MD Debridement Type: Debridement Level of Consciousness (Pre-procedure): Awake and Alert Pre-procedure Verification/Time Out Yes - 13:36 Taken: Start Time: 13:37 Pain Control: Lidocaine 5% topical ointment T Area Debrided (L x W): otal 0.3 (cm) x 0.3 (cm) = 0.09 (cm) Tissue and other material debrided: Non-Viable, Eschar, Slough, Slough Level: Non-Viable Tissue Debridement Description: Selective/Open Wound Instrument: Curette Bleeding: Minimum Hemostasis Achieved: Pressure Response to Treatment: Procedure was tolerated well Level of Consciousness (Post- Awake and  Alert procedure): Post Debridement Measurements of Total Wound Length: (cm) 0.3 Width: (cm) 0.3 Depth: (cm) 0.1 Volume: (cm) 0.007 Character of Wound/Ulcer Post Debridement: Requires Further Debridement Post Procedure Diagnosis Same as Pre-procedure Notes Scribed for Dr. Celine Ahr by [name] East, RN Electronic Signature(s) Signed: 06/30/2022 2:25:58 PM By: [name] Maudlin MD FACS Signed: 06/30/2022 4:25:11 PM By: [name] East RN Entered By: [name] on 06/30/2022 [name], Candace [name] ([id number] Page 2 of 8 -------------------------------------------------------------------------------- HPI Details Patient Name: Date of Service: [name], [name] 06/30/2022 1:15 PM Medical Record Number: [id number] Patient Account Number: [id number] Date of Birth/Sex: Treating RN: [birth year] (67 y.o. F) Primary Care Provider: Dorthy [name], [name] Other Clinician: Referring Provider: Treating Provider/Extender: [name], [name] in Treatment: 2 History of Present Illness HPI Description: ADMISSION 06/14/2022 This is a 67 year old otherwise quite healthy woman who underwent repair of the Haglund's deformity and ruptured Achilles tendon back in August 2023. The wound subsequently became infected and dehisced. She ended up hospitalized for the infection and has since been on multiple courses of oral antibiotics along with use of a wound VAC for a time then Betadine wet-to-dry dressing changes. [place] sure that her wound was healed and she would be able to complete her move without difficulty. She has just been using triple antibiotic ointment and a Band-Aid at this point. On the back of her left heel, there is a small circular wound in the otherwise well-healed scar line. There is some dry eschar around the edges. The wound itself is just a couple of millimeters in diameter. No concern  for infection. 06/23/2022: The wound is basically unchanged in size today. There is some eschar and dried collagen accumulation. 06/30/2022: The wound is unchanged in size. There is eschar and slough accumulation. The surface appears quite healthy and I think the wound may be a bit shallower. Electronic Signature(s) Signed: 06/30/2022 1:57:11 PM By: [name] Maudlin MD FACS Entered By: [name] on 06/30/2022 13:57:11 --------------------------------------------------------------------------------  Physical Exam Details Patient Name: Date of Service: [facility] [name], [name] 06/30/2022 1:15 PM Medical Record Number: [id number] Patient Account Number: [id number] Date of Birth/Sex: Treating RN: Apr 14, 1956 (67 y.o. F) Primary Care Provider: Dorthy [name], [name] Other Clinician: Referring Provider: Treating Provider/Extender: [name], [name] in Treatment: 2 Constitutional . . . . no acute distress. Respiratory Normal work of breathing on room air. Notes 06/30/2022: The wound is unchanged in size. There is eschar and slough accumulation. The surface appears quite healthy and I think the wound may be a bit shallower. Electronic Signature(s) Signed: 06/30/2022 1:57:39 PM By: [name] Maudlin MD FACS Entered By: [name] on 06/30/2022 13:57:39 -------------------------------------------------------------------------------- Physician Orders Details Patient Name: Date of Service: [facility] [name], [name] 06/30/2022 1:15 PM Medical Record Number: [id number] Patient Account Number: [id number] Date of Birth/Sex: Treating RN: [birth year] (67 y.o. [name] Primary Care Provider: Dorthy [name], [place] Other Clinician: Referring Provider: Treating Provider/Extender: [name], [name] in Treatment: 2 Verbal / Phone Orders: No Diagnosis Coding ICD-10 Coding [name], [name] ([id number]) [id number] Page 3 of 8 Code Description [phone]  Non-pressure chronic ulcer of left heel and midfoot with fat layer exposed T81.31XS Disruption of external operation (surgical) wound, not elsewhere classified, sequela I10 Essential (primary) hypertension Follow-up Appointments ppointment in 2 [name]. - Dr. Celine Ahr Rm 4 Return A Anesthetic Wound #1 Left Achilles (In clinic) Topical Lidocaine 4% applied to wound bed Bathing/ Shower/ Hygiene May shower and wash wound with soap and water. - Make sure to dry wound area really well Edema Control - Lymphedema / SCD / Other Left Lower Extremity Elevate legs to the level of the heart or above for 30 minutes daily and/or when sitting for 3-4 times a day throughout the day. Exercise regularly Moisturize legs daily. Wound Treatment Wound #1 - Achilles Wound Laterality: Left Cleanser: Soap and Water 1 x Per Day/30 Days Discharge Instructions: May shower and wash wound with dial antibacterial soap and water prior to dressing change. Cleanser: Byram Ancillary Kit - 15 Day Supply (Generic) 1 x Per Day/30 Days Discharge Instructions: Use supplies as instructed; Kit contains: (15) Saline Bullets; (15) 3x3 Gauze; 15 pr Gloves Peri-Wound Care: Sween Lotion (Moisturizing lotion) 1 x Per Day/30 Days Discharge Instructions: Apply moisturizing lotion as directed Prim Dressing: Promogran Prisma Matrix, 4.34 (sq in) (silver collagen) (Generic) 1 x Per Day/30 Days ary Discharge Instructions: Moisten collagen with saline or hydrogel Secondary Dressing: Zetuvit Plus Silicone Border Dressing 4x4 (in/in) (Generic) 1 x Per Day/30 Days Discharge Instructions: Apply silicone border over primary dressing as directed. Electronic Signature(s) Signed: 06/30/2022 2:25:58 PM By: [name] Maudlin MD FACS Entered By: [name] on 06/30/2022 13:58:03 -------------------------------------------------------------------------------- Problem List Details Patient Name: Date of Service: [facility] [name], [name] 06/30/2022 1:15  PM Medical Record Number: [id number] Patient Account Number: [id number] Date of Birth/Sex: Treating RN: Nov 14, 1955 (67 y.o. F) Primary Care Provider: Dorthy [name], [name] Other Clinician: Referring Provider: Treating Provider/Extender: [name], [name] in Treatment: 2 Active Problems ICD-10 Encounter Code Description Active Date MDM Diagnosis L97.422 Non-pressure chronic ulcer of left heel and midfoot with fat layer exposed 06/14/2022 No Yes T81.31XS Disruption of external operation (surgical) wound, not elsewhere classified, 06/14/2022 No Yes sequela I10 Essential (primary) hypertension 06/14/2022 No Yes [name], [name] ([id number] Page 4 of 8 Inactive Problems Resolved Problems Electronic Signature(s) Signed: 06/30/2022 1:54:02 PM By: [name] Maudlin MD FACS Entered By: [name] on 06/30/2022 13:54:02 -------------------------------------------------------------------------------- Progress Note Details Patient Name: Date of  Service: [name], [name] 06/30/2022 1:15 PM Medical Record Number: [id number] Patient Account Number: [id number] Date of Birth/Sex: Treating RN: 1955-08-16 (67 y.o. F) Primary Care Provider: Dorthy [name], [name] Other Clinician: Referring Provider: Treating Provider/Extender: [name], [name] in Treatment: 2 Subjective Chief Complaint Information obtained from Patient Patient presents to the wound care center with open non-healing surgical wound(s) History of Present Illness (HPI) ADMISSION 06/14/2022 This is a 67 year old otherwise quite healthy woman who underwent repair of the Haglund's deformity and ruptured Achilles tendon back in August 2023. The wound subsequently became infected and dehisced. She ended up hospitalized for the infection and has since been on multiple courses of oral antibiotics along with use of a wound VAC for a time then Betadine wet-to-dry dressing  changes. [place] sure that her wound was healed and she would be able to complete her move without difficulty. She has just been using triple antibiotic ointment and a Band-Aid at this point. On the back of her left heel, there is a small circular wound in the otherwise well-healed scar line. There is some dry eschar around the edges. The wound itself is just a couple of millimeters in diameter. No concern for infection. 06/23/2022: The wound is basically unchanged in size today. There is some eschar and dried collagen accumulation. 06/30/2022: The wound is unchanged in size. There is eschar and slough accumulation. The surface appears quite healthy and I think the wound may be a bit shallower. Patient History Family History Diabetes - Maternal Grandparents,Mother, Heart Disease - Maternal Grandparents,Mother, Hypertension - Maternal Grandparents,Mother, Lung Disease - Father, Stroke - Mother,Maternal Grandparents, No family history of Cancer, Hereditary Spherocytosis, Kidney Disease, Seizures, Thyroid Problems, Tuberculosis. Social History Never smoker, Marital Status - Widowed, Alcohol Use - Never, Drug Use - No History, Caffeine Use - Daily. Medical History Eyes Patient has history of Cataracts - bilateral Denies history of Glaucoma, Optic Neuritis Ear/Nose/Mouth/Throat Denies history of Chronic sinus problems/congestion, Middle ear problems Hematologic/Lymphatic Denies history of Anemia, Hemophilia, Human Immunodeficiency Virus, Lymphedema Respiratory Denies history of Aspiration, Asthma, Chronic Obstructive Pulmonary Disease (COPD), Pneumothorax, Sleep Apnea, Tuberculosis Cardiovascular Patient has history of Arrhythmia - sinus tachycardia, Hypertension Denies history of Hypotension, Myocardial Infarction, Peripheral Arterial Disease, Peripheral Venous Disease, Phlebitis, Vasculitis Gastrointestinal Denies history of Cirrhosis ,  Colitis, Crohnoos, Hepatitis A, Hepatitis B, Hepatitis C Genitourinary Denies history of End Stage Renal Disease Immunological Denies history of Lupus Erythematosus, Raynaudoos, Scleroderma Integumentary (Skin) Denies history of History of Burn Musculoskeletal Patient has history of Osteoarthritis - bilateral knees Denies history of Gout, Rheumatoid Arthritis, Osteomyelitis Neurologic Denies history of Dementia, Neuropathy, Quadriplegia, Paraplegia, Seizure Disorder Psychiatric Denies history of Anorexia/bulimia, Confinement Anxiety [name], [name] ([id number].pdf Page 5 of 8 Objective Constitutional no acute distress. Vitals Time Taken: 1:15 PM, Height: 64 in, Weight: 210 lbs, BMI: 36, Temperature: 97.9 F, Pulse: 91 bpm, Respiratory Rate: 18 breaths/min, Blood Pressure: 137/85 mmHg. Respiratory Normal work of breathing on room air. General Notes: 06/30/2022: The wound is unchanged in size. There is eschar and slough accumulation. The surface appears quite healthy and I think the wound may be a bit shallower. Integumentary (Hair, Skin) Wound #1 status is Open. Original cause of wound was Surgical Injury. The date acquired was: 12/26/2021. The wound has been in treatment 2 [name]. The wound is located on the Left Achilles. The wound measures 0.3cm length x 0.3cm width x 0.1cm depth; 0.071cm^2 area  and 0.007cm^3 volume. There is Fat Layer (Subcutaneous Tissue) exposed. There is no tunneling or undermining noted. There is a medium amount of serous drainage noted. There is large (67-100%) granulation within the wound bed. There is a small (1-33%) amount of necrotic tissue within the wound bed including Eschar. The periwound skin appearance exhibited: Scarring, Dry/Scaly, Rubor. The periwound skin appearance did not exhibit: Maceration. Periwound temperature was noted as No Abnormality. Assessment Active Problems ICD-10 Non-pressure chronic ulcer of  left heel and midfoot with fat layer exposed Disruption of external operation (surgical) wound, not elsewhere classified, sequela Essential (primary) hypertension Procedures Wound #1 Pre-procedure diagnosis of Wound #1 is a Dehisced Wound located on the Left Achilles . There was a Selective/Open Wound Non-Viable Tissue Debridement with a total area of 0.09 sq cm performed by [name] Maudlin, MD. With the following instrument(s): Curette to remove Non-Viable tissue/material. Material removed includes Eschar and Slough and after achieving pain control using Lidocaine 5% topical ointment. No specimens were taken. A time out was conducted at 13:36, prior to the start of the procedure. A Minimum amount of bleeding was controlled with Pressure. The procedure was tolerated well. Post Debridement Measurements: 0.3cm length x 0.3cm width x 0.1cm depth; 0.007cm^3 volume. Character of Wound/Ulcer Post Debridement requires further debridement. Post procedure Diagnosis Wound #1: Same as Pre-Procedure General Notes: Scribed for Dr. Celine Ahr by [name] East, RN. Plan Follow-up Appointments: Return Appointment in 2 [name]. - Dr. Celine Ahr Rm 4 Anesthetic: Wound #1 Left Achilles: (In clinic) Topical Lidocaine 4% applied to wound bed Bathing/ Shower/ Hygiene: May shower and wash wound with soap and water. - Make sure to dry wound area really well Edema Control - Lymphedema / SCD / Other: Elevate legs to the level of the heart or above for 30 minutes daily and/or when sitting for 3-4 times a day throughout the day. Exercise regularly Moisturize legs daily. WOUND #1: - Achilles Wound Laterality: Left Cleanser: Soap and Water 1 x Per Day/30 Days Discharge Instructions: May shower and wash wound with dial antibacterial soap and water prior to dressing change. Cleanser: Byram Ancillary Kit - 15 Day Supply (Generic) 1 x Per Day/30 Days Discharge Instructions: Use supplies as instructed; Kit contains: (15) Saline  Bullets; (15) 3x3 Gauze; 15 pr Gloves Peri-Wound Care: Sween Lotion (Moisturizing lotion) 1 x Per Day/30 Days Discharge Instructions: Apply moisturizing lotion as directed Prim Dressing: Promogran Prisma Matrix, 4.34 (sq in) (silver collagen) (Generic) 1 x Per Day/30 Days ary Discharge Instructions: Moisten collagen with saline or hydrogel Secondary Dressing: Zetuvit Plus Silicone Border Dressing 4x4 (in/in) (Generic) 1 x Per Day/30 Days Discharge Instructions: Apply silicone border over primary dressing as directed. [name], [name] ([id number]) [id number] Page 6 of 8 06/30/2022: The wound is unchanged in size. There is eschar and slough accumulation. The surface appears quite healthy and I think the wound may be a bit shallower. I used a curette to debride slough and eschar from the wound. We will continue Prisma silver collagen with a foam border dressing for now. Due to limited provider availability next week, she will follow-up in 2 [name]. Electronic Signature(s) Signed: 06/30/2022 1:58:39 PM By: [name] Maudlin MD FACS Entered By: [name] on 06/30/2022 13:58:39 -------------------------------------------------------------------------------- HxROS Details Patient Name: Date of Service: [name], [name] 06/30/2022 1:15 PM Medical Record Number: [id number] Patient Account Number: [id number] Date of Birth/Sex: Treating RN: 11-Nov-1955 (67 y.o. F) Primary Care Provider: Dorthy [name], [name] Other Clinician: Referring Provider: Treating Provider/Extender: [name], [name] in  Treatment: 2 Eyes Medical History: Positive for: Cataracts - bilateral Negative for: Glaucoma; Optic Neuritis Ear/Nose/Mouth/Throat Medical History: Negative for: Chronic sinus problems/congestion; Middle ear problems Hematologic/Lymphatic Medical History: Negative for: Anemia; Hemophilia; Human Immunodeficiency Virus; Lymphedema Respiratory Medical  History: Negative for: Aspiration; Asthma; Chronic Obstructive Pulmonary Disease (COPD); Pneumothorax; Sleep Apnea; Tuberculosis Cardiovascular Medical History: Positive for: Arrhythmia - sinus tachycardia; Hypertension Negative for: Hypotension; Myocardial Infarction; Peripheral Arterial Disease; Peripheral Venous Disease; Phlebitis; Vasculitis Gastrointestinal Medical History: Negative for: Cirrhosis ; Colitis; Crohns; Hepatitis A; Hepatitis B; Hepatitis C Genitourinary Medical History: Negative for: End Stage Renal Disease Immunological Medical History: Negative for: Lupus Erythematosus; Raynauds; Scleroderma Integumentary (Skin) Medical History: Negative for: History of Burn Musculoskeletal Medical HistoryMarland Kitchen [name], [name] ([id number]) [phone].pdf Page 7 of 8 Positive for: Osteoarthritis - bilateral knees Negative for: Gout; Rheumatoid Arthritis; Osteomyelitis Neurologic Medical History: Negative for: Dementia; Neuropathy; Quadriplegia; Paraplegia; Seizure Disorder Psychiatric Medical History: Negative for: Anorexia/bulimia; Confinement Anxiety HBO Extended History Items Eyes: Cataracts Immunizations Pneumococcal Vaccine: Received Pneumococcal Vaccination: Yes Received Pneumococcal Vaccination On or After 60th Birthday: Yes Implantable Devices None Family and Social History Cancer: No; Diabetes: Yes - Maternal Grandparents,Mother; Heart Disease: Yes - Maternal Grandparents,Mother; Hereditary Spherocytosis: No; Hypertension: Yes - Maternal Grandparents,Mother; Kidney Disease: No; Lung Disease: Yes - Father; Seizures: No; Stroke: Yes - Mother,Maternal Grandparents; Thyroid Problems: No; Tuberculosis: No; Never smoker; Marital Status - Widowed; Alcohol Use: Never; Drug Use: No History; Caffeine Use: Daily; Financial Concerns: No; Food, Clothing or Shelter Needs: No; Support System Lacking: No; 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 06/30/2022 Medical Record Number: [id number] Patient Account Number: [id number] Date of Birth/Sex: Treating RN: [birth year] (67 y.o. F) Primary Care Provider: Dorthy [name], [name] Other Clinician: Referring Provider: Treating Provider/Extender: [name], [name] in Treatment: 2 Diagnosis Coding ICD-10 Codes Code Description [phone] Non-pressure chronic ulcer of left heel and midfoot with fat layer exposed T81.31XS Disruption of external operation (surgical) wound, not elsewhere classified, sequela I10 Essential (primary) hypertension Facility Procedures : CPT4 Code: [id number] Description: [id number] - DEBRIDE WOUND 1ST 20 SQ CM OR < ICD-10 Diagnosis Description L97.422 Non-pressure chronic ulcer of left heel and midfoot with fat layer exposed Modifier: Quantity: 1 Physician Procedures : CPT4 Code Description Modifier [id number] - WC PHYS LEVEL 3 - EST PT 25 ICD-10 Diagnosis Description L97.422 Non-pressure chronic ulcer of left heel and midfoot with fat layer exposed T81.31XS Disruption of external operation (surgical) wound, not  elsewhere classified, sequela I10 Essential (primary) hypertension [name], [name] ([id number]) [id number] - WC PHYS DEBR WO ANESTH 20 SQ CM 1 ICD-10 Diagnosis Description L97.422 Non-pressure chronic ulcer of left  heel and midfoot with fat layer exposed Quantity: 1 7.pdf Page 8 of 8 Electronic Signature(s) Signed: 06/30/2022 1:58:57 PM By: [name] Maudlin MD FACS Entered By: [name] on 06/30/2022 13:58:57

## 2022-07-02 NOTE — Progress Notes (Signed)
[name], [name] ([id number].pdf Page 1 of 7 Visit Report for 06/30/2022 Arrival Information Details Patient Name: Date of Service: [facility] DES, [place] 06/30/2022 1:15 PM Medical Record Number: [id number] Patient Account Number: [id number] Date of Birth/Sex: Treating RN: [birth year] (67 y.o. [name] Primary Care Revin Corker: [name], [name] Other Clinician: Referring Michaeal Davis: Treating Yeray Tomas/Extender: Adrian Prows, [name] Weeks in Treatment: 2 Visit Information History Since Last Visit Added or deleted any medications: No Patient Arrived: Ambulatory Any new allergies or adverse reactions: No Arrival Time: 13:22 Had a fall or experienced change in No Accompanied By: self activities of daily living that may affect Transfer Assistance: None risk of falls: Patient Identification Verified: Yes Signs or symptoms of abuse/neglect since last visito No Secondary Verification Process Completed: Yes Hospitalized since last visit: No Patient Requires Transmission-Based Precautions: No Implantable device outside of the clinic excluding No Patient Has Alerts: Yes cellular tissue based products placed in the center Patient Alerts: ABI LLE 1.11 since last visit: ABI RLE 1.04 Has Dressing in Place as Prescribed: Yes Pain Present Now: Yes Electronic Signature(s) Signed: 06/30/2022 4:25:11 PM By: [name] RN Entered By: [name] on 06/30/2022 13:22:54 -------------------------------------------------------------------------------- Encounter Discharge Information Details Patient Name: Date of Service: [facility] [place], [name] 06/30/2022 1:15 PM Medical Record Number: [id number] Patient Account Number: [id number] Date of Birth/Sex: Treating RN: [birth year] (67 y.o. [name] Primary Care Jakelin Taussig: [name], [name] Other Clinician: Referring Lynore Coscia: Treating Keyri Salberg/Extender: Adrian Prows, [name] Weeks in Treatment:  2 Encounter Discharge Information Items Post Procedure Vitals Discharge Condition: Stable Temperature (F): 97.9 Ambulatory Status: Ambulatory Pulse (bpm): 91 Discharge Destination: Home Respiratory Rate (breaths/min): 18 Transportation: Private Auto Blood Pressure (mmHg): 137/85 Accompanied By: self Schedule Follow-up Appointment: Yes Clinical Summary of Care: Electronic Signature(s) Signed: 06/30/2022 4:25:11 PM By: [name] RN Entered By: [name] on 06/30/2022 13:39:49 -------------------------------------------------------------------------------- Lower Extremity Assessment Details Patient Name: Date of Service: [facility] [place], [name] 06/30/2022 1:15 PM Medical Record Number: [id number] Patient Account Number: [id number] Date of Birth/Sex: Treating RN: [birth year] (67 y.o. [name] Primary Care Sumer [name]: [name], [place] Other Clinician: Referring [name]: Treating Amarien Carne/Extender: Adrian Prows, [name] Weeks in Treatment: 2 Edema Assessment Assessed: [Left: No] [Right: No] R[LeftJEARL, HAVARD [id number] Patrice [id number].pdf Page 2 of 7] [Left: Edema] [Right: :] Calf Left: Right: Point of Measurement: From Medial Instep 40 cm Ankle Left: Right: Point of Measurement: From Medial Instep 24 cm Vascular Assessment Pulses: Dorsalis Pedis Palpable: [Left:Yes] Electronic Signature(s) Signed: 06/30/2022 4:25:11 PM By: [name] RN Entered By: [name] on 06/30/2022 13:27:08 -------------------------------------------------------------------------------- Multi Wound Chart Details Patient Name: Date of Service: [facility] [place], [place] [name] 06/30/2022 1:15 PM Medical Record Number: [id number] Patient Account Number: [id number] Date of Birth/Sex: Treating RN: [birth year] (67 y.o. F) Primary Care Lynett [name]: [name], [name] Other Clinician: Referring [name]: Treating Mckaylee Dimalanta/Extender: Adrian Prows, [name] Weeks in  Treatment: 2 Vital Signs Height(in): 64 Pulse(bpm): 91 Weight(lbs): 210 Blood Pressure(mmHg): 137/85 Body Mass Index(BMI): 36 Temperature(F): 97.9 Respiratory Rate(breaths/min): 18 [1:Photos:] [N/A:N/A] Left Achilles N/A N/A Wound Location: Surgical Injury N/A N/A Wounding Event: Dehisced Wound N/A N/A Primary Etiology: Cataracts, Arrhythmia, Hypertension, N/A N/A Comorbid History: Osteoarthritis 12/26/2021 N/A N/A Date Acquired: 2 N/A N/A Weeks of Treatment: Open N/A N/A Wound Status: No N/A N/A Wound Recurrence: 0.3x0.3x0.1 N/A N/A Measurements L x W x D (cm) 0.071 N/A N/A A (cm) : rea 0.007 N/A N/A Volume (cm) : 0.00% N/A N/A % Reduction in Area: 0.00%  N/A N/A % Reduction in Volume: Full Thickness Without Exposed N/A N/A Classification: Support Structures Medium N/A N/A Exudate A mount: Serous N/A N/A Exudate Type: amber N/A N/A Exudate Color: Large (67-100%) N/A N/A Granulation Amount: Small (1-33%) N/A N/A Necrotic Amount: Eschar N/A N/A Necrotic Tissue: Fat Layer (Subcutaneous Tissue): Yes N/A N/A Exposed Structures: Fascia: No Tendon: No HELVI, SRADER ([id number].pdf Page 3 of 7 Muscle: No Joint: No Bone: No Small (1-33%) N/A N/A Epithelialization: Debridement - Selective/Open Wound N/A N/A Debridement: Pre-procedure Verification/Time Out 13:36 N/A N/A Taken: Lidocaine 5% topical ointment N/A N/A Pain Control: Necrotic/Eschar, Slough N/A N/A Tissue Debrided: Non-Viable Tissue N/A N/A Level: 0.09 N/A N/A Debridement A (sq cm): rea Curette N/A N/A Instrument: Minimum N/A N/A Bleeding: Pressure N/A N/A Hemostasis A chieved: Procedure was tolerated well N/A N/A Debridement Treatment Response: 0.3x0.3x0.1 N/A N/A Post Debridement Measurements L x W x D (cm) 0.007 N/A N/A Post Debridement Volume: (cm) Scarring: Yes N/A N/A Periwound Skin Texture: Dry/Scaly: Yes N/A N/A Periwound Skin  Moisture: Maceration: No Rubor: Yes N/A N/A Periwound Skin Color: No Abnormality N/A N/A Temperature: Debridement N/A N/A Procedures Performed: Treatment Notes Wound #1 (Achilles) Wound Laterality: Left Cleanser Soap and Water Discharge Instruction: May shower and wash wound with dial antibacterial soap and water prior to dressing change. Byram Ancillary Kit - 15 Day Supply Discharge Instruction: Use supplies as instructed; Kit contains: (15) Saline Bullets; (15) 3x3 Gauze; 15 pr Gloves Peri-Wound Care Sween Lotion (Moisturizing lotion) Discharge Instruction: Apply moisturizing lotion as directed Topical Primary Dressing Promogran Prisma Matrix, 4.34 (sq in) (silver collagen) Discharge Instruction: Moisten collagen with saline or hydrogel Secondary Dressing Zetuvit Plus Silicone Border Dressing 4x4 (in/in) Discharge Instruction: Apply silicone border over primary dressing as directed. Secured With Compression Wrap Compression Stockings Environmental education officer) Signed: 06/30/2022 1:54:11 PM By: Fredirick Maudlin MD FACS Entered By: Fredirick Maudlin on 06/30/2022 13:54:10 -------------------------------------------------------------------------------- Multi-Disciplinary Care Plan Details Patient Name: Date of Service: [facility] [place], [place] [name] 06/30/2022 1:15 PM Medical Record Number: [id number] Patient Account Number: [id number] Date of Birth/Sex: Treating RN: [birth year] (67 y.o. [name] Primary Care Jenille Laszlo: [place], [place] Other Clinician: Referring Luma Clopper: Treating Jyra Lagares/Extender: Adrian Prows, [name] Weeks in Treatment: [address] ANYSIA, LEHANE K ([id number]) [id number] Page 4 of 7 Wound/Skin Impairment Nursing Diagnoses: Impaired tissue integrity Goals: Ulcer/skin breakdown will have a volume reduction of 30% by week 4 Date Initiated: 06/23/2022 Target Resolution Date: 07/24/2022 Goal Status:  Active Interventions: Assess ulceration(s) every visit Provide education on ulcer and skin care Treatment Activities: Skin care regimen initiated : 06/23/2022 Notes: Electronic Signature(s) Signed: 06/30/2022 4:25:11 PM By: [name] RN Entered By: [name] on 06/30/2022 13:38:46 -------------------------------------------------------------------------------- Pain Assessment Details Patient Name: Date of Service: [facility] DES, [place] [name] 06/30/2022 1:15 PM Medical Record Number: [id number] Patient Account Number: [id number] Date of Birth/Sex: Treating RN: 1955-05-10 (67 y.o. [name] Primary Care Charlis Harner: [name], [name] Other Clinician: Referring Samella Lucchetti: Treating Amaro Mangold/Extender: Adrian Prows, [name] Weeks in Treatment: 2 Active Problems Location of Pain Severity and Description of Pain Patient Has Paino Yes Site Locations Rate the pain. Current Pain Level: 4 Character of Pain Describe the Pain: Aching, Burning Pain Management and Medication Current Pain Management: Electronic Signature(s) Signed: 06/30/2022 4:25:11 PM By: [name] RN Entered By: [name] on 06/30/2022 13:25:41 Patient/Caregiver Education Details -------------------------------------------------------------------------------- Arletha Grippe ([id number].pdf Page 5 of 7 Patient Name: Date of Service: [name], DA [name] 3/8/2024andnbsp1:15 PM Medical Record  Number: [id number] Patient Account Number: [id number] Date of Birth/Gender: Treating RN: 1955-12-16 (67 y.o. [name] Primary Care Physician: [name], [place] Other Clinician: Referring Physician: Treating Physician/Extender: Adrian Prows, [name] Weeks in Treatment: 2 Education Assessment Education Provided To: Patient Education Topics Provided Wound Debridement: Methods: Explain/Verbal Responses: Reinforcements needed, State content correctly Wound/Skin  Impairment: Methods: Explain/Verbal Responses: Reinforcements needed, State content correctly Electronic Signature(s) Signed: 06/30/2022 4:25:11 PM By: [name] RN Entered By: [name] on 06/30/2022 13:39:01 -------------------------------------------------------------------------------- Wound Assessment Details Patient Name: Date of Service: [facility] DES, [place] [name] 06/30/2022 1:15 PM Medical Record Number: [id number] Patient Account Number: [id number] Date of Birth/Sex: Treating RN: 07-19-1955 (67 y.o. [name] Primary Care Allante Whitmire: [name], [name] Other Clinician: Referring Kaheem Halleck: Treating Imaad Reuss/Extender: Adrian Prows, [name] Weeks in Treatment: 2 Wound Status Wound Number: 1 Primary Etiology: Dehisced Wound Wound Location: Left Achilles Wound Status: Open Wounding Event: Surgical Injury Comorbid History: Cataracts, Arrhythmia, Hypertension, Osteoarthritis Date Acquired: 12/26/2021 Weeks Of Treatment: 2 Clustered Wound: No Photos Wound Measurements Length: (cm) 0.3 Width: (cm) 0.3 Depth: (cm) 0.1 Area: (cm) 0.071 Volume: (cm) 0.007 % Reduction in Area: 0% % Reduction in Volume: 0% Epithelialization: Small (1-33%) Tunneling: No Undermining: No Wound Description Classification: Full Thickness Without Exposed Support Structures Exudate Amount: Medium Exudate Type: Serous Exudate Color: amber [facility], [facility] ([id number]) Foul Odor After Cleansing: No Slough/Fibrino Yes [id number] Page 6 of 7 Wound Bed Granulation Amount: Large (67-100%) Exposed Structure Necrotic Amount: Small (1-33%) Fascia Exposed: No Necrotic Quality: Eschar Fat Layer (Subcutaneous Tissue) Exposed: Yes Tendon Exposed: No Muscle Exposed: No Joint Exposed: No Bone Exposed: No Periwound Skin Texture Texture Color No Abnormalities Noted: No No Abnormalities Noted: No Scarring: Yes Rubor: Yes Moisture Temperature / Pain No  Abnormalities Noted: No Temperature: No Abnormality Dry / Scaly: Yes Maceration: No Treatment Notes Wound #1 (Achilles) Wound Laterality: Left Cleanser Soap and Water Discharge Instruction: May shower and wash wound with dial antibacterial soap and water prior to dressing change. Byram Ancillary Kit - 15 Day Supply Discharge Instruction: Use supplies as instructed; Kit contains: (15) Saline Bullets; (15) 3x3 Gauze; 15 pr Gloves Peri-Wound Care Sween Lotion (Moisturizing lotion) Discharge Instruction: Apply moisturizing lotion as directed Topical Primary Dressing Promogran Prisma Matrix, 4.34 (sq in) (silver collagen) Discharge Instruction: Moisten collagen with saline or hydrogel Secondary Dressing Zetuvit Plus Silicone Border Dressing 4x4 (in/in) Discharge Instruction: Apply silicone border over primary dressing as directed. Secured With Compression Wrap Compression Stockings Environmental education officer) Signed: 06/30/2022 4:25:11 PM By: [name] RN Entered By: [name] on 06/30/2022 13:33:28 -------------------------------------------------------------------------------- Vitals Details Patient Name: Date of Service: [facility] DES, [place] [name] 06/30/2022 1:15 PM Medical Record Number: [id number] Patient Account Number: [id number] Date of Birth/Sex: Treating RN: [birth year] (67 y.o. [name] Primary Care Akshith Moncus: [name], [name] Other Clinician: Referring Juliano Mceachin: Treating Attie Nawabi/Extender: Adrian Prows, [name] Weeks in Treatment: 2 Vital Signs Time Taken: 13:15 Temperature (F): 97.9 Height (in): 64 Pulse (bpm): 91 Weight (lbs): 210 Respiratory Rate (breaths/min): 18 Body Mass Index (BMI): 36 Blood Pressure (mmHg): 137/85 Reference Range: 80 - 120 mg / dl AJIAH, MILLION K ([id number]) [id number] Page 7 of 7 Electronic Signature(s) Signed: 06/30/2022 4:25:11 PM By: [name] RN Entered By: [name] on  06/30/2022 13:25:32

## 2022-07-04 ENCOUNTER — Ambulatory Visit: Payer: Medicare PPO | Attending: Cardiovascular Disease

## 2022-07-04 DIAGNOSIS — I10 Essential (primary) hypertension: Secondary | ICD-10-CM

## 2022-07-04 DIAGNOSIS — Z79899 Other long term (current) drug therapy: Secondary | ICD-10-CM

## 2022-07-05 LAB — BASIC METABOLIC PANEL
BUN/Creatinine Ratio: 23 (ref 12–28)
BUN: 18 mg/dL (ref 8–27)
CO2: 29 mmol/L (ref 20–29)
Calcium: 10.1 mg/dL (ref 8.7–10.3)
Chloride: 100 mmol/L (ref 96–106)
Creatinine, Ser: 0.8 mg/dL (ref 0.57–1.00)
Glucose: 73 mg/dL (ref 70–99)
Potassium: 3.8 mmol/L (ref 3.5–5.2)
Sodium: 144 mmol/L (ref 134–144)
eGFR: 81 mL/min/{1.73_m2} (ref >59)

## 2022-07-12 ENCOUNTER — Other Ambulatory Visit: Payer: Self-pay | Admitting: Podiatry

## 2022-07-12 ENCOUNTER — Encounter: Payer: Self-pay | Admitting: Podiatry

## 2022-07-12 MED ORDER — OXYCODONE-ACETAMINOPHEN 10-325 MG PO TABS: 1.0000 | ORAL_TABLET | ORAL | 0 refills | Status: AC | PRN

## 2022-07-12 NOTE — Progress Notes (Unsigned)
3

## 2022-07-14 ENCOUNTER — Encounter (HOSPITAL_BASED_OUTPATIENT_CLINIC_OR_DEPARTMENT_OTHER): Payer: Medicare PPO | Admitting: General Surgery

## 2022-07-14 DIAGNOSIS — L97422 Non-pressure chronic ulcer of left heel and midfoot with fat layer exposed: Secondary | ICD-10-CM | POA: Diagnosis not present

## 2022-07-14 DIAGNOSIS — T8131XS Disruption of external operation (surgical) wound, not elsewhere classified, sequela: Secondary | ICD-10-CM | POA: Diagnosis not present

## 2022-07-15 NOTE — Progress Notes (Signed)
[name] ([id number]) [id number] Page 1 of 8 Visit Report for 07/14/2022 Chief Complaint Document Details Patient Name: Date of Service: [name], [place] 07/14/2022 2:45 PM Medical Record Number: [id number] Patient Account Number: [id number] Date of Birth/Sex: Treating RN: 1955-10-16 (67 y.o. F) Primary Care Provider: Dorthy [name], [name], [name] in Treatment: 4 Information Obtained from: Patient Chief Complaint Patient presents to the wound care center with open non-healing surgical wound(s) Electronic Signature(s) Signed: 07/14/2022 3:51:39 PM By: Fredirick Maudlin MD FACS Entered By: Fredirick Maudlin on 07/14/2022 15:51:39 -------------------------------------------------------------------------------- Debridement Details Patient Name: Date of Service: [name], [name] 07/14/2022 2:45 PM Medical Record Number: [id number] Patient Account Number: [id number] Date of Birth/Sex: Treating RN: 1956-01-24 (67 y.o. [name], [name], [name] in Treatment: 4 Debridement Performed for Assessment: Wound #1 Left Achilles Performed By: Physician Fredirick Maudlin, MD Debridement Type: Debridement Level of Consciousness (Pre-procedure): Awake and Alert Pre-procedure Verification/Time Out Yes - 15:17 Taken: Start Time: 15:18 Pain Control: Lidocaine 5% topical ointment T Area Debrided (L x W): otal 0.5 (cm) x 0.3 (cm) = 0.15 (cm) Tissue and other material debrided: Viable, Non-Viable, Eschar, Slough, Subcutaneous, Slough Level: Skin/Subcutaneous Tissue Debridement Description: Excisional Instrument: Curette Bleeding: Minimum Hemostasis Achieved: Pressure Response to Treatment: Procedure was tolerated well Level of Consciousness  (Post- Awake and Alert procedure): Post Debridement Measurements of Total Wound Length: (cm) 0.5 Width: (cm) 0.3 Depth: (cm) 0.2 Volume: (cm) 0.024 Character of Wound/Ulcer Post Debridement: Requires Further Debridement Post Procedure Diagnosis Same as Pre-procedure Notes Scribed for Dr. Celine Ahr by [name], RN Electronic Signature(s) Signed: 07/14/2022 4:03:37 PM By: [name] RN Signed: 07/14/2022 4:18:44 PM By: Fredirick Maudlin MD FACS Previous Signature: 07/14/2022 3:58:17 PM Version By: Fredirick Maudlin MD FACS Entered By: [name] on 07/14/2022 16:03:37 Havard, [name] ([id number]) [id number] Page 2 of 8 -------------------------------------------------------------------------------- HPI Details Patient Name: Date of Service: [name] 07/14/2022 2:45 PM Medical Record Number: [id number] Patient Account Number: [id number] Date of Birth/Sex: Treating RN: 1955-05-10 (67 y.o. F) Primary Care Provider: Dorthy [name], [name], [name] in Treatment: 4 History of Present Illness HPI Description: ADMISSION 06/14/2022 This is a 67 year old otherwise quite healthy woman who underwent repair of the Haglund's deformity and ruptured Achilles tendon back in August 2023. The wound subsequently became infected and dehisced. She ended up hospitalized for the infection and has since been on multiple courses of oral antibiotics along with use of a wound VAC for a time then Betadine wet-to-dry dressing changes. [place] sure that her wound was healed and she would be able to complete her move without difficulty. She has just been using triple antibiotic ointment and a Band-Aid at this point. On the back of her left heel, there is a small circular wound in the otherwise well-healed scar line. There is some dry  eschar around the edges. The wound itself is just a couple of millimeters in diameter. No concern for infection. 06/23/2022: The wound is basically unchanged in size today. There is some eschar and dried collagen accumulation. 06/30/2022: The wound is unchanged in size. There is eschar and slough accumulation. The surface appears quite healthy and I think the wound may be a bit shallower. 07/14/2022: The wound is really unchanged overall.  There is some eschar and slough accumulation. Electronic Signature(s) Signed: 07/14/2022 3:52:10 PM By: Fredirick Maudlin MD FACS Entered By: Fredirick Maudlin on 07/14/2022 15:52:10 -------------------------------------------------------------------------------- Physical Exam Details Patient Name: Date of Service: [name], [name] 07/14/2022 2:45 PM Medical Record Number: [id number] Patient Account Number: [id number] Date of Birth/Sex: Treating RN: [birth year] (67 y.o. F) Primary Care Provider: Dorthy [name], [name], [name] in Treatment: 4 Constitutional Slightly hypertensive. . . . no acute distress. Respiratory Normal work of breathing on room air. Notes 07/14/2022: The wound is really unchanged overall. There is some eschar and slough accumulation. Electronic Signature(s) Signed: 07/14/2022 3:53:07 PM By: Fredirick Maudlin MD FACS Entered By: Fredirick Maudlin on 07/14/2022 15:53:06 -------------------------------------------------------------------------------- Physician Orders Details Patient Name: Date of Service: [name], [name] 07/14/2022 2:45 PM Medical Record Number: [id number] Patient Account Number: [id number] Date of Birth/Sex: Treating RN: 1956/04/04 (67 y.o. [name] Primary Care Provider: Dorthy [name], [name], [name] in Treatment: 4 Verbal / Phone  Orders: No Diagnosis Coding [name], [name] ([id number]) [id number] Page 3 of 8 ICD-10 Coding Code Description [phone] Non-pressure chronic ulcer of left heel and midfoot with fat layer exposed T81.31XS Disruption of external operation (surgical) wound, not elsewhere classified, sequela I10 Essential (primary) hypertension Follow-up Appointments ppointment in 2 [name]. - Dr. Alvera Singh 2 Return [place] [facility] [phone] [address] [place], [place] [address] Other: - Pickup mupirocin (topical antibiotic) at pharmacy Anesthetic Wound #1 Left Achilles (In clinic) Topical Lidocaine 4% applied to wound bed Bathing/ Shower/ Hygiene May shower and wash wound with soap and water. - Make sure to dry wound area really well Edema Control - Lymphedema / SCD / Other Left Lower Extremity Elevate legs to the level of the heart or above for 30 minutes daily and/or when sitting for 3-4 times a day throughout the day. Exercise regularly Moisturize legs daily. Wound Treatment Wound #1 - Achilles Wound Laterality: Left Cleanser: Soap and Water 1 x Per Day/30 Days Discharge Instructions: May shower and wash wound with dial antibacterial soap and water prior to dressing change. Cleanser: Byram Ancillary Kit - 15 Day Supply (Generic) 1 x Per Day/30 Days Discharge Instructions: Use supplies as instructed; Kit contains: (15) Saline Bullets; (15) 3x3 Gauze; 15 pr Gloves Peri-Wound Care: Sween Lotion (Moisturizing lotion) 1 x Per Day/30 Days Discharge Instructions: Apply moisturizing lotion as directed Topical: Mupirocin Ointment 1 x Per Day/30 Days Discharge Instructions: Apply Mupirocin (Bactroban) as instructed Prim Dressing: Endoform 2x2 in (DME) (Generic) 1 x Per Day/30 Days ary Discharge Instructions: Moisten with saline Secondary Dressing: Zetuvit Plus Silicone Border Dressing 4x4 (in/in) (DME) (Generic) 1 x Per Day/30 Days Discharge  Instructions: Apply silicone border over primary dressing as directed. [facility]- [place], [place], [place] - Evaluate and treat non-healing wound to Non-pressure chronic ulcer of left heel Patient Medications llergies: elemental sulfur, lisinopril, Zoloft, atenolol A Notifications Medication Indication Start End 07/14/2022 mupirocin DOSE topical 2 % ointment - Apply to wound with every other day dressing changes Electronic Signature(s) Signed: 07/14/2022 3:55:32 PM By: Fredirick Maudlin MD FACS Entered By: Fredirick Maudlin on 07/14/2022 15:55:31 Prescription 07/14/2022 -------------------------------------------------------------------------------- [name] K. Fredirick Maudlin MD Patient Name: Provider: 1955-07-14 [id number] Date of Birth: NPI#: [name], [name] ([id number]) [id number] Page 4 of 8 F [id number] Sex: DEA #: [phone] [id number] Phone #: License #: [facility] Patient Address: [address] [place], [place] [address] [place], [place] [address] [phone] Allergies elemental sulfur; lisinopril; Zoloft; atenolol Provider's Orders wound care clinic- [place], [place], [place] - Evaluate and treat non-healing wound to Non-pressure chronic ulcer of left heel Hand Signature: Date(s): Electronic Signature(s) Signed: 07/14/2022 3:59:37 PM By: Fredirick Maudlin MD FACS Entered By: Fredirick Maudlin on 07/14/2022 15:59:37 -------------------------------------------------------------------------------- Problem List Details Patient Name: Date of Service: [facility] [name], [name] 07/14/2022 2:45 PM Medical Record Number: [id number] Patient Account Number: [id number] Date of Birth/Sex: Treating RN: [birth year] (67 y.o. F) Primary Care Provider: Dorthy [name], [name],  [name] in Treatment: 4 Active Problems ICD-10 Encounter Code Description Active Date MDM Diagnosis L97.422 Non-pressure chronic ulcer of left heel and midfoot with fat layer exposed 06/14/2022 No Yes T81.31XS Disruption of external operation (surgical) wound, not elsewhere classified, 06/14/2022 No Yes sequela I10 Essential (primary) hypertension 06/14/2022 No Yes Inactive Problems Resolved Problems Electronic Signature(s) Signed: 07/14/2022 3:50:31 PM By: Fredirick Maudlin MD FACS Entered By: Fredirick Maudlin on 07/14/2022 15:50:31 -------------------------------------------------------------------------------- Progress Note Details Patient Name: Date of Service: [facility] [name], [name] 07/14/2022 2:45 PM Medical Record Number: [id number] Patient Account Number: [id number] Date of Birth/Sex: Treating RN: 1955/07/08 (67 y.o. F) Primary Care Provider: Dorthy [name], [name], [name] in Treatment: Horatio, [name] ([id number]) [id number] Page 5 of 8 Subjective Chief Complaint Information obtained from Patient Patient presents to the wound care center with open non-healing surgical wound(s) History of Present Illness (HPI) ADMISSION 06/14/2022 This is a 67 year old otherwise quite healthy woman who underwent repair of the Haglund's deformity and ruptured Achilles tendon back in August 2023. The wound subsequently became infected and dehisced. She ended up hospitalized for the infection and has since been on multiple courses of oral antibiotics along with use of a wound VAC for a time then Betadine wet-to-dry dressing changes. [place] make sure that her wound was healed and she would be able to complete her move without difficulty. She has just been using triple antibiotic ointment and a Band-Aid at this point. On the back  of her left heel, there is a small circular wound in the otherwise well-healed scar line. There is some dry eschar around the edges. The wound itself is just a couple of millimeters in diameter. No concern for infection. 06/23/2022: The wound is basically unchanged in size today. There is some eschar and dried collagen accumulation. 06/30/2022: The wound is unchanged in size. There is eschar and slough accumulation. The surface appears quite healthy and I think the wound may be a bit shallower. 07/14/2022: The wound is really unchanged overall. There is some eschar and slough accumulation. Patient History Family History Diabetes - Maternal Grandparents,Mother, Heart Disease - Maternal Grandparents,Mother, Hypertension - Maternal Grandparents,Mother, Lung Disease - Father, Stroke - Mother,Maternal Grandparents, No family history of Cancer, Hereditary Spherocytosis, Kidney Disease, Seizures, Thyroid Problems, Tuberculosis. Social History Never smoker, Marital Status - Widowed, Alcohol Use - Never, Drug Use - No History, Caffeine Use - Daily. Medical History Eyes Patient has history of Cataracts - bilateral Denies history of Glaucoma, Optic Neuritis Ear/Nose/Mouth/Throat Denies history of Chronic sinus problems/congestion, Middle ear problems Hematologic/Lymphatic Denies history of Anemia, Hemophilia, Human Immunodeficiency Virus, Lymphedema Respiratory Denies history of Aspiration, Asthma, Chronic Obstructive Pulmonary Disease (COPD), Pneumothorax, Sleep Apnea,  Tuberculosis Cardiovascular Patient has history of Arrhythmia - sinus tachycardia, Hypertension Denies history of Hypotension, Myocardial Infarction, Peripheral Arterial Disease, Peripheral Venous Disease, Phlebitis, Vasculitis Gastrointestinal Denies history of Cirrhosis , Colitis, Crohnoos, Hepatitis A, Hepatitis B, Hepatitis C Genitourinary Denies history of End Stage Renal Disease Immunological Denies history of Lupus  Erythematosus, Raynaudoos, Scleroderma Integumentary (Skin) Denies history of History of Burn Musculoskeletal Patient has history of Osteoarthritis - bilateral knees Denies history of Gout, Rheumatoid Arthritis, Osteomyelitis Neurologic Denies history of Dementia, Neuropathy, Quadriplegia, Paraplegia, Seizure Disorder Psychiatric Denies history of Anorexia/bulimia, Confinement Anxiety Objective Constitutional Slightly hypertensive. no acute distress. Vitals Time Taken: 2:42 PM, Height: 64 in, Weight: 210 lbs, BMI: 36, Temperature: 98 F, Pulse: 85 bpm, Respiratory Rate: 16 breaths/min, Blood Pressure: 143/88 mmHg. Respiratory Normal work of breathing on room air. General Notes: 07/14/2022: The wound is really unchanged overall. There is some eschar and slough accumulation. Integumentary (Hair, Skin) ELLYANNA, CARABAJAL ([id number]) [id number].pdf Page 6 of 8 Wound #1 status is Open. Original cause of wound was Surgical Injury. The date acquired was: 12/26/2021. The wound has been in treatment 4 [name]. The wound is located on the Left Achilles. The wound measures 0.5cm length x 0.3cm width x 0.2cm depth; 0.118cm^2 area and 0.024cm^3 volume. There is Fat Layer (Subcutaneous Tissue) exposed. There is no tunneling or undermining noted. There is a medium amount of serous drainage noted. There is small (1-33%) pink granulation within the wound bed. There is a large (67-100%) amount of necrotic tissue within the wound bed including Eschar and Adherent Slough. The periwound skin appearance exhibited: Scarring, Dry/Scaly, Rubor. The periwound skin appearance did not exhibit: Maceration. Periwound temperature was noted as No Abnormality. Assessment Active Problems ICD-10 Non-pressure chronic ulcer of left heel and midfoot with fat layer exposed Disruption of external operation (surgical) wound, not elsewhere classified, sequela Essential (primary)  hypertension Procedures Wound #1 Pre-procedure diagnosis of Wound #1 is a Dehisced Wound located on the Left Achilles . There was a Excisional Skin/Subcutaneous Tissue Debridement with a total area of 0.15 sq cm performed by Fredirick Maudlin, MD. With the following instrument(s): Curette to remove Viable and Non-Viable tissue/material. Material removed includes Eschar, Subcutaneous Tissue, and Slough after achieving pain control using Lidocaine 5% topical ointment. No specimens were taken. A time out was conducted at 15:17, prior to the start of the procedure. A Minimum amount of bleeding was controlled with Pressure. The procedure was tolerated well. Post Debridement Measurements: 0.5cm length x 0.3cm width x 0.2cm depth; 0.024cm^3 volume. Character of Wound/Ulcer Post Debridement requires further debridement. Post procedure Diagnosis Wound #1: Same as Pre-Procedure General Notes: Scribed for Dr. Celine Ahr by [name], RN. Plan Follow-up Appointments: Return Appointment in 2 [name]. - Dr. Alvera Singh 2 [facility]- Wound care clinic [phone] [address] [place], [place] [address] Other: - Pickup mupirocin (topical antibiotic) at pharmacy Anesthetic: Wound #1 Left Achilles: (In clinic) Topical Lidocaine 4% applied to wound bed Bathing/ Shower/ Hygiene: May shower and wash wound with soap and water. - Make sure to dry wound area really well Edema Control - Lymphedema / SCD / Other: Elevate legs to the level of the heart or above for 30 minutes daily and/or when sitting for 3-4 times a day throughout the day. Exercise regularly Moisturize legs daily. ordered were: wound care clinic- [place], [place], [place] - Evaluate and treat non-healing wound to Non-pressure chronic ulcer of left heel The following medication(s) was prescribed: mupirocin topical 2 % ointment Apply to wound with  every other day dressing changes starting 07/14/2022 WOUND #1: - Achilles Wound Laterality:  Left Cleanser: Soap and Water 1 x Per Day/30 Days Discharge Instructions: May shower and wash wound with dial antibacterial soap and water prior to dressing change. Cleanser: Byram Ancillary Kit - 15 Day Supply (Generic) 1 x Per Day/30 Days Discharge Instructions: Use supplies as instructed; Kit contains: (15) Saline Bullets; (15) 3x3 Gauze; 15 pr Gloves Peri-Wound Care: Sween Lotion (Moisturizing lotion) 1 x Per Day/30 Days Discharge Instructions: Apply moisturizing lotion as directed Topical: Mupirocin Ointment 1 x Per Day/30 Days Discharge Instructions: Apply Mupirocin (Bactroban) as instructed Prim Dressing: Endoform 2x2 in (DME) (Generic) 1 x Per Day/30 Days ary Discharge Instructions: Moisten with saline Secondary Dressing: Zetuvit Plus Silicone Border Dressing 4x4 (in/in) (DME) (Generic) 1 x Per Day/30 Days Discharge Instructions: Apply silicone border over primary dressing as directed. 07/14/2022: The wound is really unchanged overall. There is some eschar and slough accumulation. As the wound is stalled, I performed an aggressive debridement today, removing slough, subcutaneous tissue, and skin from the wound edges, which were starting to roll inward. She does have a history of staph infection in this wound so although it does not appear overtly infected, I am going to add topical mupirocin and also change her contact layer to endoform. She would like to get into her new home, which is in [place], [place]. There is a wound care center affiliated with the [facility] about 25 minutes away from there and she requested that we place a referral to that site. We will accommodate her with this request. She will continue to follow-up here until she is able to get an appointment at that site. Follow-up in 1 week. Electronic Signature(s) MADDEX, FICHTNER ([id number]) [id number] Page 7 of 8 Signed: 07/17/2022 4:40:03 PM By: Deon Pilling RN,  BSN Signed: 07/17/2022 5:12:23 PM By: Fredirick Maudlin MD FACS Previous Signature: 07/14/2022 3:58:34 PM Version By: Fredirick Maudlin MD FACS Previous Signature: 07/14/2022 3:57:35 PM Version By: Fredirick Maudlin MD FACS Entered By: Deon Pilling on 07/17/2022 16:38:51 -------------------------------------------------------------------------------- HxROS Details Patient Name: Date of Service: [facility] [name], [name] 07/14/2022 2:45 PM Medical Record Number: [id number] Patient Account Number: [id number] Date of Birth/Sex: Treating RN: [birth year] (67 y.o. F) Primary Care Provider: Dorthy [name], [name], [name] in Treatment: 4 Eyes Medical History: Positive for: Cataracts - bilateral Negative for: Glaucoma; Optic Neuritis Ear/Nose/Mouth/Throat Medical History: Negative for: Chronic sinus problems/congestion; Middle ear problems Hematologic/Lymphatic Medical History: Negative for: Anemia; Hemophilia; Human Immunodeficiency Virus; Lymphedema Respiratory Medical History: Negative for: Aspiration; Asthma; Chronic Obstructive Pulmonary Disease (COPD); Pneumothorax; Sleep Apnea; Tuberculosis Cardiovascular Medical History: Positive for: Arrhythmia - sinus tachycardia; Hypertension Negative for: Hypotension; Myocardial Infarction; Peripheral Arterial Disease; Peripheral Venous Disease; Phlebitis; Vasculitis Gastrointestinal Medical History: Negative for: Cirrhosis ; Colitis; Crohns; Hepatitis A; Hepatitis B; Hepatitis C Genitourinary Medical History: Negative for: End Stage Renal Disease Immunological Medical History: Negative for: Lupus Erythematosus; Raynauds; Scleroderma Integumentary (Skin) Medical History: Negative for: History of Burn Musculoskeletal Medical History: Positive for: Osteoarthritis - bilateral knees Negative for: Gout; Rheumatoid Arthritis; Osteomyelitis Neurologic Medical  History: Negative for: Dementia; Neuropathy; Quadriplegia; Paraplegia; Seizure Disorder Psychiatric Medical History: LETECIA, TOLENTO ([id number]) [id number] Page 8 of 8 Negative for: Anorexia/bulimia; Confinement Anxiety HBO Extended History Items Eyes: Cataracts Immunizations Pneumococcal Vaccine: Received Pneumococcal Vaccination: Yes Received Pneumococcal Vaccination On or After 60th Birthday: Yes Implantable Devices None Family and Social History Cancer: No; Diabetes: Yes -  Maternal Grandparents,Mother; Heart Disease: Yes - Maternal Grandparents,Mother; Hereditary Spherocytosis: No; Hypertension: Yes - Maternal Grandparents,Mother; Kidney Disease: No; Lung Disease: Yes - Father; Seizures: No; Stroke: Yes - Mother,Maternal Grandparents; Thyroid Problems: No; Tuberculosis: No; Never smoker; Marital Status - Widowed; Alcohol Use: Never; Drug Use: No History; Caffeine Use: Daily; Financial Concerns: No; Food, Clothing or Shelter Needs: No; Support System Lacking: No; Transportation Concerns: No Electronic Signature(s) Signed: 07/14/2022 4:18:44 PM By: Fredirick Maudlin MD FACS Entered By: Fredirick Maudlin on 07/14/2022 15:52:39 -------------------------------------------------------------------------------- SuperBill Details Patient Name: Date of Service: [facility] [name], [place] K. 07/14/2022 Medical Record Number: [id number] Patient Account Number: [id number] Date of Birth/Sex: Treating RN: 1955-04-29 (67 y.o. F) Primary Care Provider: Dorthy [name], [name], [name] in Treatment: 4 Diagnosis Coding ICD-10 Codes Code Description [phone] Non-pressure chronic ulcer of left heel and midfoot with fat layer exposed T81.31XS Disruption of external operation (surgical) wound, not elsewhere classified, sequela I10 Essential (primary) hypertension Facility Procedures : CPT4  Code: [id number] Description: 11042 - DEB SUBQ TISSUE 20 SQ CM/< ICD-10 Diagnosis Description L97.422 Non-pressure chronic ulcer of left heel and midfoot with fat layer exposed Modifier: Quantity: 1 Physician Procedures : CPT4 Code Description Modifier [id number] - WC PHYS LEVEL 4 - EST PT 25 ICD-10 Diagnosis Description L97.422 Non-pressure chronic ulcer of left heel and midfoot with fat layer exposed T81.31XS Disruption of external operation (surgical) wound, not  elsewhere classified, sequela I10 Essential (primary) hypertension Quantity: 1 : [id number] - WC PHYS SUBQ TISS 20 SQ CM ICD-10 Diagnosis Description L97.422 Non-pressure chronic ulcer of left heel and midfoot with fat layer exposed Quantity: 1 Electronic Signature(s) Signed: 07/14/2022 3:58:57 PM By: Fredirick Maudlin MD FACS Entered By: Fredirick Maudlin on 07/14/2022 15:58:57

## 2022-07-15 NOTE — Progress Notes (Signed)
MARCHETTA, SOLES ([id number]) [phone].pdf Page 1 of 6 Visit Report for 07/14/2022 Arrival Information Details Patient Name: Date of Service: [facility] [place], [place] 07/14/2022 2:45 PM Medical Record Number: [id number] Patient Account Number: [id number] Date of Birth/Sex: Treating RN: 1956/03/25 (67 y.o. [name] Primary Care Homero Hyson: [name], [name] Other Clinician: Referring [name]: Treating [name]/Extender: [name], [name] Weeks in Treatment: 4 Visit Information History Since Last Visit Added or deleted any medications: No Patient Arrived: Ambulatory Any new allergies or adverse reactions: No Arrival Time: 14:41 Had a fall or experienced change in No Accompanied By: self activities of daily living that may affect Transfer Assistance: None risk of falls: Patient Identification Verified: Yes Signs or symptoms of abuse/neglect since last visito No Patient Requires Transmission-Based Precautions: No Hospitalized since last visit: No Patient Has Alerts: Yes Implantable device outside of the clinic excluding No Patient Alerts: ABI LLE 1.11 cellular tissue based products placed in the center ABI RLE 1.04 since last visit: Has Dressing in Place as Prescribed: Yes Pain Present Now: No Electronic Signature(s) Signed: 07/14/2022 4:14:13 PM By: [name] Catholic RN Entered By: [name] on 07/14/2022 14:41:57 -------------------------------------------------------------------------------- Encounter Discharge Information Details Patient Name: Date of Service: [facility] [place], [name] 07/14/2022 2:45 PM Medical Record Number: [id number] Patient Account Number: [id number] Date of Birth/Sex: Treating RN: January 28, 1956 (67 y.o. [name] Primary Care Pritika Alvarez: [name], [name] Other Clinician: Referring Tyyonna Soucy: Treating [name]/Extender: [name], [name] Weeks in Treatment: 4 Encounter Discharge Information Items Post  Procedure Vitals Discharge Condition: Stable Temperature (F): 98.0 Ambulatory Status: Ambulatory Pulse (bpm): 85 Discharge Destination: Home Respiratory Rate (breaths/min): 16 Transportation: Private Auto Blood Pressure (mmHg): 143/88 Accompanied By: self Schedule Follow-up Appointment: Yes Clinical Summary of Care: Electronic Signature(s) Signed: 07/14/2022 4:02:49 PM By: [name] East RN Entered By: [name] on 07/14/2022 16:02:49 -------------------------------------------------------------------------------- Lower Extremity Assessment Details Patient Name: Date of Service: [facility] [place], [name] 07/14/2022 2:45 PM Medical Record Number: [id number] Patient Account Number: [id number] Date of Birth/Sex: Treating RN: 10-05-1955 (67 y.o. [name] Primary Care Anayely Constantine: [name], [name] Other Clinician: Referring [name]: Treating [name]/Extender: [name], [name] Weeks in Treatment: 4 Edema Assessment Assessed: [Left: No] [Right: No] R[LeftALAYTHIA, DICKERT [id number] [Right: [id number].pdf Page 2 of 6] [Left: Edema] [Right: :] Calf Left: Right: Point of Measurement: From Medial Instep 42 cm Ankle Left: Right: Point of Measurement: From Medial Instep 24.9 cm Vascular Assessment Pulses: Dorsalis Pedis Palpable: [Left:Yes] Electronic Signature(s) Signed: 07/14/2022 4:14:13 PM By: [name] Catholic RN Entered By: [name] on 07/14/2022 14:46:13 -------------------------------------------------------------------------------- Multi Wound Chart Details Patient Name: Date of Service: [facility] [place], [name] 07/14/2022 2:45 PM Medical Record Number: [id number] Patient Account Number: [id number] Date of Birth/Sex: Treating RN: 1956-01-16 (67 y.o. F) Primary Care [name]: [name], [name] Other Clinician: Referring [name]: Treating [name]/Extender: [name], [name] Weeks in Treatment: 4 Vital  Signs Height(in): 64 Pulse(bpm): 85 Weight(lbs): 210 Blood Pressure(mmHg): 143/88 Body Mass Index(BMI): 36 Temperature(F): 98 Respiratory Rate(breaths/min): 16 [1:Photos:] [N/A:N/A] Left Achilles N/A N/A Wound Location: Surgical Injury N/A N/A Wounding Event: Dehisced Wound N/A N/A Primary Etiology: Cataracts, Arrhythmia, Hypertension, N/A N/A Comorbid History: Osteoarthritis 12/26/2021 N/A N/A Date Acquired: 4 N/A N/A Weeks of Treatment: Open N/A N/A Wound Status: No N/A N/A Wound Recurrence: 0.5x0.3x0.2 N/A N/A Measurements L x W x D (cm) 0.118 N/A N/A A (cm) : rea 0.024 N/A N/A Volume (cm) : -66.20% N/A N/A % Reduction in Area: -242.90% N/A N/A % Reduction in  Volume: Full Thickness Without Exposed N/A N/A Classification: Support Structures Medium N/A N/A Exudate A mount: Serous N/A N/A Exudate Type: amber N/A N/A Exudate Color: Small (1-33%) N/A N/A Granulation Amount: Pink N/A N/A Granulation Quality: Large (67-100%) N/A N/A Necrotic Amount: Eschar, Adherent Slough N/A N/A Necrotic Tissue: Fat Layer (Subcutaneous Tissue): Yes N/A N/A Exposed Structures: Fascia: No [name], [name] ([id number]) [phone].pdf Page 3 of 6 Tendon: No Muscle: No Joint: No Bone: No Small (1-33%) N/A N/A Epithelialization: Debridement - Selective/Open Wound N/A N/A Debridement: Pre-procedure Verification/Time Out 15:17 N/A N/A Taken: Lidocaine 5% topical ointment N/A N/A Pain Control: Necrotic/Eschar, Slough N/A N/A Tissue Debrided: Non-Viable Tissue N/A N/A Level: 0.15 N/A N/A Debridement A (sq cm): rea Curette N/A N/A Instrument: Minimum N/A N/A Bleeding: Pressure N/A N/A Hemostasis A chieved: Procedure was tolerated well N/A N/A Debridement Treatment Response: 0.5x0.3x0.2 N/A N/A Post Debridement Measurements L x W x D (cm) 0.024 N/A N/A Post Debridement Volume: (cm) Scarring: Yes N/A N/A Periwound Skin  Texture: Dry/Scaly: Yes N/A N/A Periwound Skin Moisture: Maceration: No Rubor: Yes N/A N/A Periwound Skin Color: No Abnormality N/A N/A Temperature: Debridement N/A N/A Procedures Performed: Treatment Notes Electronic Signature(s) Signed: 07/14/2022 3:50:38 PM By: Fredirick Maudlin MD FACS Entered By: Fredirick Maudlin on 07/14/2022 15:50:38 -------------------------------------------------------------------------------- Multi-Disciplinary Care Plan Details Patient Name: Date of Service: [facility] [place], [name] 07/14/2022 2:45 PM Medical Record Number: [id number] Patient Account Number: [id number] Date of Birth/Sex: Treating RN: August 02, 1955 (67 y.o. [name] Primary Care Marshall Kampf: [name], [name] Other Clinician: Referring Joeanthony Seeling: Treating Triston Skare/Extender: [name], [name] Weeks in Treatment: 4 Active Inactive Wound/Skin Impairment Nursing Diagnoses: Impaired tissue integrity Goals: Ulcer/skin breakdown will have a volume reduction of 30% by week 4 Date Initiated: 06/23/2022 Target Resolution Date: 07/24/2022 Goal Status: Active Interventions: Assess ulceration(s) every visit Provide education on ulcer and skin care Treatment Activities: Skin care regimen initiated : 06/23/2022 Notes: Electronic Signature(s) Signed: 07/14/2022 4:14:13 PM By: [name] Catholic RN Entered By: [name] on 07/14/2022 15:20:58 -------------------------------------------------------------------------------- Pain Assessment Details Patient Name: Date of Service: [facility] [place], [name] 07/14/2022 2:45 PM [name], [name] ([id number].pdf Page 4 of 6 Medical Record Number: [id number] Patient Account Number: [id number] Date of Birth/Sex: Treating RN: June 19, 1955 (67 y.o. [name] Primary Care Illyanna Petillo: [name], [name] Other Clinician: Referring Paden Senger: Treating Osias Resnick/Extender: [name], [name] Weeks in Treatment:  4 Active Problems Location of Pain Severity and Description of Pain Patient Has Paino Yes Site Locations Pain Location: Generalized Pain With Dressing Change: No Duration of the Pain. Constant / Intermittento Constant Rate the pain. Current Pain Level: 3 Worst Pain Level: 10 Least Pain Level: 3 Tolerable Pain Level: 3 Character of Pain Describe the Pain: Difficult to Pinpoint Pain Management and Medication Current Pain Management: Medication: Yes Cold Application: No Rest: Yes Massage: No Activity: No T.E.N.S.: No Heat Application: No Leg drop or elevation: No Is the Current Pain Management Adequate: Adequate How does your wound impact your activities of daily livingo Sleep: No Bathing: No Appetite: No Relationship With Others: No Bladder Continence: No Emotions: No Bowel Continence: No Work: No Toileting: No Drive: No Dressing: No Hobbies: No Electronic Signature(s) Signed: 07/14/2022 4:14:13 PM By: [name] Catholic RN Entered By: [name] on 07/14/2022 14:44:28 -------------------------------------------------------------------------------- Patient/Caregiver Education Details Patient Name: Date of Service: [facility] [place], [place] [name] 3/22/2024andnbsp2:45 PM Medical Record Number: [id number] Patient Account Number: [id number] Date of Birth/Gender: Treating RN: 09-May-1955 (67 y.o. [name] Primary Care Physician: [name],  [name] Other Clinician: Referring Physician: Treating Physician/Extender: [name], [name] Weeks in Treatment: 4 Education Assessment Education Provided To: Patient Education Topics Provided Wound Debridement: Methods: Explain/Verbal [name], [name] ([id number]) [phone].pdf Page 5 of 6 Responses: Reinforcements needed, State content correctly Wound/Skin Impairment: Methods: Explain/Verbal Responses: Reinforcements needed, State content correctly Electronic Signature(s) Signed:  07/14/2022 4:14:13 PM By: [name] Catholic RN Entered By: [name] on 07/14/2022 15:21:12 -------------------------------------------------------------------------------- Wound Assessment Details Patient Name: Date of Service: [facility] DES, [name] 07/14/2022 2:45 PM Medical Record Number: [id number] Patient Account Number: [id number] Date of Birth/Sex: Treating RN: 22-Jun-1955 (67 y.o. [name] Primary Care Annamay Laymon: [name], [name] Other Clinician: Referring Arda Daggs: Treating Taahir Grisby/Extender: [name], [name] Weeks in Treatment: 4 Wound Status Wound Number: 1 Primary Etiology: Dehisced Wound Wound Location: Left Achilles Wound Status: Open Wounding Event: Surgical Injury Comorbid History: Cataracts, Arrhythmia, Hypertension, Osteoarthritis Date Acquired: 12/26/2021 Weeks Of Treatment: 4 Clustered Wound: No Photos Wound Measurements Length: (cm) 0.5 Width: (cm) 0.3 Depth: (cm) 0.2 Area: (cm) 0.118 Volume: (cm) 0.024 % Reduction in Area: -66.2% % Reduction in Volume: -242.9% Epithelialization: Small (1-33%) Tunneling: No Undermining: No Wound Description Classification: Full Thickness Without Exposed Support Structures Exudate Amount: Medium Exudate Type: Serous Exudate Color: amber Foul Odor After Cleansing: No Slough/Fibrino Yes Wound Bed Granulation Amount: Small (1-33%) Exposed Structure Granulation Quality: Pink Fascia Exposed: No Necrotic Amount: Large (67-100%) Fat Layer (Subcutaneous Tissue) Exposed: Yes Necrotic Quality: Eschar, Adherent Slough Tendon Exposed: No Muscle Exposed: No Joint Exposed: No Bone Exposed: No Periwound Skin Texture Texture Color No Abnormalities Noted: No No Abnormalities Noted: No Scarring: Yes Rubor: Yes Moisture Temperature / Pain [name], [name] ([id number]) [phone].pdf Page 6 of 6 No Abnormalities Noted: No Temperature: No Abnormality Dry / Scaly:  Yes Maceration: No Treatment Notes Wound #1 (Achilles) Wound Laterality: Left Cleanser Soap and Water Discharge Instruction: May shower and wash wound with dial antibacterial soap and water prior to dressing change. Byram Ancillary Kit - 15 Day Supply Discharge Instruction: Use supplies as instructed; Kit contains: (15) Saline Bullets; (15) 3x3 Gauze; 15 pr Gloves Peri-Wound Care Sween Lotion (Moisturizing lotion) Discharge Instruction: Apply moisturizing lotion as directed Topical Mupirocin Ointment Discharge Instruction: Apply Mupirocin (Bactroban) as instructed Primary Dressing Endoform 2x2 in Discharge Instruction: Moisten with saline Secondary Dressing Zetuvit Plus Silicone Border Dressing 4x4 (in/in) Discharge Instruction: Apply silicone border over primary dressing as directed. Secured With Compression Wrap Compression Stockings Environmental education officer) Signed: 07/14/2022 4:14:13 PM By: [name] Catholic RN Entered By: [name] on 07/14/2022 15:01:09 -------------------------------------------------------------------------------- Vitals Details Patient Name: Date of Service: [facility] DES, [name] 07/14/2022 2:45 PM Medical Record Number: [id number] Patient Account Number: [id number] Date of Birth/Sex: Treating RN: January 29, 1956 (67 y.o. [name] Primary Care Kirby Cortese: [name], [name] Other Clinician: Referring Malky Rudzinski: Treating Belma Dyches/Extender: [name], [name] Weeks in Treatment: 4 Vital Signs Time Taken: 14:42 Temperature (F): 98 Height (in): 64 Pulse (bpm): 85 Weight (lbs): 210 Respiratory Rate (breaths/min): 16 Body Mass Index (BMI): 36 Blood Pressure (mmHg): 143/88 Reference Range: 80 - 120 mg / dl Electronic Signature(s) Signed: 07/14/2022 4:14:13 PM By: [name] Catholic RN Entered By: [name] on 07/14/2022 14:42:22

## 2022-07-24 ENCOUNTER — Encounter (HOSPITAL_BASED_OUTPATIENT_CLINIC_OR_DEPARTMENT_OTHER): Payer: Medicare PPO | Attending: General Surgery | Admitting: General Surgery

## 2022-07-24 DIAGNOSIS — M1990 Unspecified osteoarthritis, unspecified site: Secondary | ICD-10-CM | POA: Insufficient documentation

## 2022-07-24 DIAGNOSIS — I10 Essential (primary) hypertension: Secondary | ICD-10-CM | POA: Diagnosis not present

## 2022-07-24 DIAGNOSIS — L97422 Non-pressure chronic ulcer of left heel and midfoot with fat layer exposed: Secondary | ICD-10-CM | POA: Insufficient documentation

## 2022-07-24 DIAGNOSIS — Z833 Family history of diabetes mellitus: Secondary | ICD-10-CM | POA: Insufficient documentation

## 2022-07-25 NOTE — Progress Notes (Signed)
[name], [name] ([id number]) [id number] Page 1 of 7 Visit Report for 07/24/2022 Arrival Information Details Patient Name: Date of Service: [name], [name] 07/24/2022 3:00 PM Medical Record Number: [id number] Patient Account Number: [id number] Date of Birth/Sex: Treating RN: [birth year] (67 y.o. F) Primary Care [name]: [name], [name] Other Clinician: Referring [name]: Treating [name]/Extender: [name], [name] in Treatment: 5 Visit Information History Since Last Visit All ordered tests and consults were completed: No Patient Arrived: Ambulatory Added or deleted any medications: No Arrival Time: 15:26 Any new allergies or adverse reactions: No Accompanied By: self Had a fall or experienced change in No Transfer Assistance: None activities of daily living that may affect Patient Identification Verified: Yes risk of falls: Secondary Verification Process Completed: Yes Signs or symptoms of abuse/neglect since last visito No Patient Requires Transmission-Based Precautions: No Hospitalized since last visit: No Patient Has Alerts: Yes Implantable device outside of the clinic excluding No Patient Alerts: ABI LLE 1.11 cellular tissue based products placed in the center ABI RLE 1.04 since last visit: Pain Present Now: No Electronic Signature(s) Signed: 07/24/2022 5:01:51 PM By: Worthy [name] Entered By: Worthy [name] on 07/24/2022 15:26:34 -------------------------------------------------------------------------------- Encounter Discharge Information Details Patient Name: Date of Service: [name], [name] [name]. 07/24/2022 3:00 PM Medical Record Number: [id number] Patient Account Number: [id number] Date of Birth/Sex: Treating RN: 1956-01-18 (67 y.o. [name] Primary Care [name]: [name], [name] Other Clinician: Referring Saint Hank: Treating [name]/Extender: [name], [name] in Treatment: 5 Encounter  Discharge Information Items Post Procedure Vitals Discharge Condition: Stable Temperature (F): 97.4 Ambulatory Status: Ambulatory Pulse (bpm): 72 Discharge Destination: Home Respiratory Rate (breaths/min): 20 Transportation: Private Auto Blood Pressure (mmHg): 158/91 Accompanied By: self Schedule Follow-up Appointment: Yes Clinical Summary of Care: Patient Declined Electronic Signature(s) Signed: 07/24/2022 4:59:27 PM By: [name] Catholic RN Entered By: [name] on 07/24/2022 16:27:37 -------------------------------------------------------------------------------- Lower Extremity Assessment Details Patient Name: Date of Service: [name], [name] [name]. 07/24/2022 3:00 PM Medical Record Number: [id number] Patient Account Number: [id number] Date of Birth/Sex: Treating RN: [birth year] (67 y.o. [name] Primary Care Marshel Golubski: [name], [name] Other Clinician: Referring Ruby Dilone: Treating [name]/Extender: [name], [name] in Treatment: 5 Edema Assessment Assessed: [Left: No] [Right: No] R[LeftDATHA, LUDD [id number] [[id number].pdf Page 2 of 7] [Left: Edema] [Right: :] Calf Left: Right: Point of Measurement: From Medial Instep 42 cm Ankle Left: Right: Point of Measurement: From Medial Instep 24.9 cm Vascular Assessment Pulses: Dorsalis Pedis Palpable: [Left:Yes] Electronic Signature(s) Signed: 07/24/2022 4:59:27 PM By: [name] Catholic RN Entered By: [name] on 07/24/2022 15:36:25 -------------------------------------------------------------------------------- Multi Wound Chart Details Patient Name: Date of Service: [name], [name] [name]. 07/24/2022 3:00 PM Medical Record Number: [id number] Patient Account Number: [id number] Date of Birth/Sex: Treating RN: 1956/04/17 (67 y.o. F) Primary Care [name]: [name], [name] Other Clinician: Referring [name]: Treating [name]/Extender: [name],  [name] in Treatment: 5 Vital Signs Height(in): 64 Pulse(bpm): 72 Weight(lbs): 210 Blood Pressure(mmHg): 158/91 Body Mass Index(BMI): 36 Temperature(F): 97.4 Respiratory Rate(breaths/min): 20 [1:Photos:] [N/A:N/A] Left Achilles N/A N/A Wound Location: Surgical Injury N/A N/A Wounding Event: Dehisced Wound N/A N/A Primary Etiology: Cataracts, Arrhythmia, Hypertension, N/A N/A Comorbid History: Osteoarthritis 12/26/2021 N/A N/A Date Acquired: 5 N/A N/A [name] of Treatment: Open N/A N/A Wound Status: No N/A N/A Wound Recurrence: 0.3x0.1x0.1 N/A N/A Measurements L x W x D (cm) 0.024 N/A N/A A (cm) : rea 0.002 N/A N/A Volume (cm) : 66.20% N/A N/A % Reduction in Area: 71.40%  N/A N/A % Reduction in Volume: Full Thickness Without Exposed N/A N/A Classification: Support Structures Medium N/A N/A Exudate A mount: Serous N/A N/A Exudate Type: amber N/A N/A Exudate Color: Small (1-33%) N/A N/A Granulation Amount: Pink N/A N/A Granulation Quality: Large (67-100%) N/A N/A Necrotic Amount: Eschar, Adherent Slough N/A N/A Necrotic Tissue: Fat Layer (Subcutaneous Tissue): Yes N/A N/A Exposed Structures: Fascia: No [name], [name] ([id number] Page 3 of 7 Tendon: No Muscle: No Joint: No Bone: No Small (1-33%) N/A N/A Epithelialization: Debridement - Selective/Open Wound N/A N/A Debridement: Pre-procedure Verification/Time Out 15:40 N/A N/A Taken: Lidocaine 5% topical ointment N/A N/A Pain Control: Necrotic/Eschar N/A N/A Tissue Debrided: Non-Viable Tissue N/A N/A Level: 0.03 N/A N/A Debridement A (sq cm): rea Curette N/A N/A Instrument: Minimum N/A N/A Bleeding: Pressure N/A N/A Hemostasis A chieved: 0 N/A N/A Procedural Pain: 0 N/A N/A Post Procedural Pain: Procedure was tolerated well N/A N/A Debridement Treatment Response: 0.3x0.1x0.1 N/A N/A Post Debridement Measurements L x W x D (cm) 0.002  N/A N/A Post Debridement Volume: (cm) Scarring: Yes N/A N/A Periwound Skin Texture: Dry/Scaly: Yes N/A N/A Periwound Skin Moisture: Maceration: No Rubor: Yes N/A N/A Periwound Skin Color: No Abnormality N/A N/A Temperature: Debridement N/A N/A Procedures Performed: Treatment Notes Wound #1 (Achilles) Wound Laterality: Left Cleanser Soap and Water Discharge Instruction: May shower and wash wound with dial antibacterial soap and water prior to dressing change. Byram Ancillary Kit - 15 Day Supply Discharge Instruction: Use supplies as instructed; Kit contains: (15) Saline Bullets; (15) 3x3 Gauze; 15 pr Gloves Peri-Wound Care Sween Lotion (Moisturizing lotion) Discharge Instruction: Apply moisturizing lotion as directed Topical Mupirocin Ointment Discharge Instruction: Apply Mupirocin (Bactroban) as instructed Primary Dressing Endoform 2x2 in Discharge Instruction: Moisten with saline Secondary Dressing Zetuvit Plus Silicone Border Dressing 4x4 (in/in) Discharge Instruction: Apply silicone border over primary dressing as directed. Secured With Compression Wrap Compression Stockings Environmental education officer) Signed: 07/24/2022 4:50:59 PM By: Fredirick Maudlin MD FACS Entered By: Fredirick Maudlin on 07/24/2022 16:50:59 -------------------------------------------------------------------------------- Multi-Disciplinary Care Plan Details Patient Name: Date of Service: [facility] [name], [name] [name]. 07/24/2022 3:00 PM Medical Record Number: [id number] Patient Account Number: [id number] Date of Birth/Sex: Treating RN: 01-05-1956 (67 y.o. [name] Primary Care Chirag Krueger: [name], Fidelis Other Clinician: Referring Lollie Gunner: Treating Anagabriela Jokerst/Extender: [name], [name] in Treatment: [place], [name] ([id number]) [id number] Page 4 of 7 Active Inactive Wound/Skin Impairment Nursing Diagnoses: Impaired tissue  integrity Goals: Ulcer/skin breakdown will have a volume reduction of 30% by week 4 Date Initiated: 06/23/2022 Target Resolution Date: 08/22/2022 Goal Status: Active Interventions: Assess ulceration(s) every visit Provide education on ulcer and skin care Treatment Activities: Skin care regimen initiated : 06/23/2022 Notes: Electronic Signature(s) Signed: 07/24/2022 4:59:27 PM By: [name] Catholic RN Entered By: [name] on 07/24/2022 15:45:20 -------------------------------------------------------------------------------- Pain Assessment Details Patient Name: Date of Service: [facility] [name], [name] [name]. 07/24/2022 3:00 PM Medical Record Number: [id number] Patient Account Number: [id number] Date of Birth/Sex: Treating RN: [birth year] (67 y.o. F) Primary Care Deneisha Dade: [name], [name] Other Clinician: Referring Laurajean Hosek: Treating Nalina Yeatman/Extender: [name], [name] in Treatment: 5 Active Problems Location of Pain Severity and Description of Pain Patient Has Paino No Site Locations Pain Management and Medication Current Pain Management: Electronic Signature(s) Signed: 07/24/2022 5:01:51 PM By: Worthy [name] Entered By: Worthy [name] on 07/24/2022 15:27:14 Schneeberger, [name] ([id number] Page 5 of 7 -------------------------------------------------------------------------------- Patient/Caregiver Education Details Patient Name: Date of Service: [name], [name] [name]. 4/1/2024andnbsp3:00 PM Medical Record  Number: [id number] Patient Account Number: [id number] Date of Birth/Gender: Treating RN: November 13, 1955 (67 y.o. [name] Primary Care Physician: [name], [place] Other Clinician: Referring Physician: Treating Physician/Extender: [name], [name] in Treatment: 5 Education Assessment Education Provided To: Patient Education Topics Provided Wound/Skin Impairment: Methods: Explain/Verbal Responses: Return  demonstration correctly Electronic Signature(s) Signed: 07/24/2022 4:59:27 PM By: [name] Catholic RN Entered By: [name] on 07/24/2022 16:26:22 -------------------------------------------------------------------------------- Wound Assessment Details Patient Name: Date of Service: [facility] [name], [name] [name]. 07/24/2022 3:00 PM Medical Record Number: [id number] Patient Account Number: [id number] Date of Birth/Sex: Treating RN: 1956/03/14 (67 y.o. F) Primary Care Jireh Elmore: [name], [name] Other Clinician: Referring Jamesyn Lindell: Treating Jared Cahn/Extender: [name], [name] in Treatment: 5 Wound Status Wound Number: 1 Primary Etiology: Dehisced Wound Wound Location: Left Achilles Wound Status: Open Wounding Event: Surgical Injury Comorbid History: Cataracts, Arrhythmia, Hypertension, Osteoarthritis Date Acquired: 12/26/2021 [name] Of Treatment: 5 Clustered Wound: No Photos Wound Measurements Length: (cm) 0.3 Width: (cm) 0.1 Depth: (cm) 0.1 Area: (cm) 0.024 Volume: (cm) 0.002 % Reduction in Area: 66.2% % Reduction in Volume: 71.4% Epithelialization: Small (1-33%) Tunneling: No Undermining: No Wound Description Classification: Full Thickness Without Exposed Support Structures Exudate Amount: Medium Exudate Type: Serous Exudate Color: amber [name], [name] ([id number]) Wound Bed Granulation Amount: Small (1-33%) Granulation Quality: Pink Necrotic Amount: Large (67-100%) Necrotic Quality: Eschar, Adherent Slough Foul Odor After Cleansing: No Slough/Fibrino Yes [phone] Page 6 of 7 Exposed Structure Fascia Exposed: No Fat Layer (Subcutaneous Tissue) Exposed: Yes Tendon Exposed: No Muscle Exposed: No Joint Exposed: No Bone Exposed: No Periwound Skin Texture Texture Color No Abnormalities Noted: No No Abnormalities Noted: No Scarring: Yes Rubor: Yes Moisture Temperature / Pain No Abnormalities Noted: No Temperature: No  Abnormality Dry / Scaly: Yes Maceration: No Treatment Notes Wound #1 (Achilles) Wound Laterality: Left Cleanser Soap and Water Discharge Instruction: May shower and wash wound with dial antibacterial soap and water prior to dressing change. Byram Ancillary Kit - 15 Day Supply Discharge Instruction: Use supplies as instructed; Kit contains: (15) Saline Bullets; (15) 3x3 Gauze; 15 pr Gloves Peri-Wound Care Sween Lotion (Moisturizing lotion) Discharge Instruction: Apply moisturizing lotion as directed Topical Mupirocin Ointment Discharge Instruction: Apply Mupirocin (Bactroban) as instructed Primary Dressing Endoform 2x2 in Discharge Instruction: Moisten with saline Secondary Dressing Zetuvit Plus Silicone Border Dressing 4x4 (in/in) Discharge Instruction: Apply silicone border over primary dressing as directed. Secured With Compression Wrap Compression Stockings Environmental education officer) Signed: 07/24/2022 4:59:27 PM By: [name] Catholic RN Entered By: [name] on 07/24/2022 15:36:57 -------------------------------------------------------------------------------- Vitals Details Patient Name: Date of Service: [facility] [name], [name] [name]. 07/24/2022 3:00 PM Medical Record Number: [id number] Patient Account Number: [id number] Date of Birth/Sex: Treating RN: Sep 02, 1955 (67 y.o. F) Primary Care Tierrah Anastos: [name], [name] Other Clinician: Referring Barkley Kratochvil: Treating Rogenia Werntz/Extender: [name], [name] in Treatment: 5 Vital Signs Time Taken: 03:26 Temperature (F): 97.4 Height (in): 64 Pulse (bpm): 72 Weight (lbs): 210 Respiratory Rate (breaths/min): 20 Body Mass Index (BMI): 36 Blood Pressure (mmHg): 158/91 [name], [name] ([id number].pdf Page 7 of 7 Reference Range: 80 - 120 mg / dl Electronic Signature(s) Signed: 07/24/2022 5:01:51 PM By: Worthy [name] Entered By: Worthy [name] on 07/24/2022 15:27:05

## 2022-07-25 NOTE — Progress Notes (Signed)
[name], [name] ([id number]) [id number] Page 1 of 8 Visit Report for 07/24/2022 Chief Complaint Document Details Patient Name: Date of Service: [facility] DES, [place] [name] K. 07/24/2022 3:00 PM Medical Record Number: [id number] Patient Account Number: [id number] Date of Birth/Sex: Treating RN: 01-30-1956 (67 y.o. F) Primary Care Provider: Dorthy [name], [name] Other Clinician: Referring Provider: Treating Provider/Extender: Adrian Prows, [name] Weeks in Treatment: 5 Information Obtained from: Patient Chief Complaint Patient presents to the wound care center with open non-healing surgical wound(s) Electronic Signature(s) Signed: 07/24/2022 4:51:04 PM By: Fredirick Maudlin MD FACS Entered By: Fredirick Maudlin on 07/24/2022 16:51:04 -------------------------------------------------------------------------------- Debridement Details Patient Name: Date of Service: [facility] DES, [place] [name] K. 07/24/2022 3:00 PM Medical Record Number: [id number] Patient Account Number: [id number] Date of Birth/Sex: Treating RN: 1955-08-12 (67 y.o. [name] Primary Care Provider: Dorthy [name], [name] Other Clinician: Referring Provider: Treating Provider/Extender: Adrian Prows, [name] Weeks in Treatment: 5 Debridement Performed for Assessment: Wound #1 Left Achilles Performed By: Physician Fredirick Maudlin, MD Debridement Type: Debridement Level of Consciousness (Pre-procedure): Awake and Alert Pre-procedure Verification/Time Out Yes - 15:40 Taken: Start Time: 15:40 Pain Control: Lidocaine 5% topical ointment T Area Debrided (L x W): otal 0.3 (cm) x 0.1 (cm) = 0.03 (cm) Tissue and other material debrided: Non-Viable, Eschar Level: Non-Viable Tissue Debridement Description: Selective/Open Wound Instrument: Curette Bleeding: Minimum Hemostasis Achieved: Pressure End Time: 15:42 Procedural Pain: 0 Post Procedural Pain: 0 Response to Treatment: Procedure was tolerated  well Level of Consciousness (Post- Awake and Alert procedure): Post Debridement Measurements of Total Wound Length: (cm) 0.3 Width: (cm) 0.1 Depth: (cm) 0.1 Volume: (cm) 0.002 Character of Wound/Ulcer Post Debridement: Improved Post Procedure Diagnosis Same as Pre-procedure Notes Scribed for Dr. Celine Ahr by J.Scotton Electronic Signature(s) Signed: 07/24/2022 4:59:27 PM By: Dellie Catholic RN Signed: 07/24/2022 5:04:49 PM By: Fredirick Maudlin MD [place], [name] ([id number]) [id number] Page 2 of 8 Entered By: Dellie Catholic on 07/24/2022 15:47:33 -------------------------------------------------------------------------------- HPI Details Patient Name: Date of Service: [facility] DES, [place] [name] K. 07/24/2022 3:00 PM Medical Record Number: [id number] Patient Account Number: [id number] Date of Birth/Sex: Treating RN: 10/16/1955 (67 y.o. F) Primary Care Provider: Dorthy [name], [name] Other Clinician: Referring Provider: Treating Provider/Extender: Adrian Prows, [name] Weeks in Treatment: 5 History of Present Illness HPI Description: ADMISSION 06/14/2022 This is a 67 year old otherwise quite healthy woman who underwent repair of the Haglund's deformity and ruptured Achilles tendon back in August 2023. The wound subsequently became infected and dehisced. She ended up hospitalized for the infection and has since been on multiple courses of oral antibiotics along with use of a wound VAC for a time then Betadine wet-to-dry dressing changes. [place] sure that her wound was healed and she would be able to complete her move without difficulty. She has just been using triple antibiotic ointment and a Band-Aid at this point. On the back of her left heel, there is a small circular wound in the otherwise well-healed scar line. There is some dry eschar around the edges. The wound itself is just a couple of millimeters  in diameter. No concern for infection. 06/23/2022: The wound is basically unchanged in size today. There is some eschar and dried collagen accumulation. 06/30/2022: The wound is unchanged in size. There is eschar and slough accumulation. The surface appears quite healthy and I think the wound may be a bit shallower. 07/14/2022: The wound is really unchanged overall. There is some eschar and slough accumulation. 07/24/2022:  Her wound is little bit narrower underneath some eschar. She has a referral in to a wound care center closer to her new home, but is waiting to confirm it is in network for her insurance. Electronic Signature(s) Signed: 07/24/2022 4:51:53 PM By: Fredirick Maudlin MD FACS Entered By: Fredirick Maudlin on 07/24/2022 16:51:53 -------------------------------------------------------------------------------- Physical Exam Details Patient Name: Date of Service: [name], [place] [name] K. 07/24/2022 3:00 PM Medical Record Number: [id number] Patient Account Number: [id number] Date of Birth/Sex: Treating RN: 1955-05-01 (67 y.o. F) Primary Care Provider: Dorthy [name], [name] Other Clinician: Referring Provider: Treating Provider/Extender: Adrian Prows, [name] Weeks in Treatment: 5 Constitutional Hypertensive, asymptomatic. . . . no acute distress. Respiratory Normal work of breathing on room air. Notes 07/24/2022: Her wound is little bit narrower underneath some eschar. Electronic Signature(s) Signed: 07/24/2022 4:52:22 PM By: Fredirick Maudlin MD FACS Entered By: Fredirick Maudlin on 07/24/2022 16:52:21 -------------------------------------------------------------------------------- Physician Orders Details Patient Name: Date of Service: [facility] DES, [place] [name] K. 07/24/2022 3:00 PM Medical Record Number: [id number] Patient Account Number: [id number] Date of Birth/Sex: Treating RN: 1956/03/18 (67 y.o. [name] Primary Care Provider: Dorthy [name], [place] Other Clinician: Referring  Provider: Treating Provider/Extender: Adrian Prows, [name] Kilpatrick, [name] ([id number]) [id number] Page 3 of 8 Weeks in Treatment: 5 Verbal / Phone Orders: No Diagnosis Coding ICD-10 Coding Code Description L97.422 Non-pressure chronic ulcer of left heel and midfoot with fat layer exposed T81.31XS Disruption of external operation (surgical) wound, not elsewhere classified, sequela I10 Essential (primary) hypertension Follow-up Appointments ppointment in 1 week. - Dr. Celine Ahr Room 3 Return A Thursday 08/03/22 at 2 pm Other: - Pickup mupirocin (topical antibiotic) at pharmacy Anesthetic Wound #1 Left Achilles (In clinic) Topical Lidocaine 4% applied to wound bed Bathing/ Shower/ Hygiene May shower and wash wound with soap and water. - Make sure to dry wound area really well Edema Control - Lymphedema / SCD / Other Left Lower Extremity Elevate legs to the level of the heart or above for 30 minutes daily and/or when sitting for 3-4 times a day throughout the day. Exercise regularly Moisturize legs daily. Wound Treatment Wound #1 - Achilles Wound Laterality: Left Cleanser: Soap and Water 1 x Per Day/30 Days Discharge Instructions: May shower and wash wound with dial antibacterial soap and water prior to dressing change. Cleanser: Byram Ancillary Kit - 15 Day Supply (Generic) 1 x Per Day/30 Days Discharge Instructions: Use supplies as instructed; Kit contains: (15) Saline Bullets; (15) 3x3 Gauze; 15 pr Gloves Peri-Wound Care: Sween Lotion (Moisturizing lotion) 1 x Per Day/30 Days Discharge Instructions: Apply moisturizing lotion as directed Topical: Mupirocin Ointment 1 x Per Day/30 Days Discharge Instructions: Apply Mupirocin (Bactroban) as instructed Prim Dressing: Endoform 2x2 in (Generic) 1 x Per Day/30 Days ary Discharge Instructions: Moisten with saline Secondary Dressing: Zetuvit Plus Silicone Border Dressing 4x4 (in/in) (Generic) 1 x  Per Day/30 Days Discharge Instructions: Apply silicone border over primary dressing as directed. Electronic Signature(s) Signed: 07/24/2022 5:04:49 PM By: Fredirick Maudlin MD FACS Entered By: Fredirick Maudlin on 07/24/2022 16:53:30 -------------------------------------------------------------------------------- Problem List Details Patient Name: Date of Service: [facility] DES, [place] [name] K. 07/24/2022 3:00 PM Medical Record Number: [id number] Patient Account Number: [id number] Date of Birth/Sex: Treating RN: Nov 16, 1955 (67 y.o. F) Primary Care Provider: Dorthy [name], [name] Other Clinician: Referring Provider: Treating Provider/Extender: Adrian Prows, [name] Weeks in Treatment: 5 Active Problems ICD-10 Encounter Code Description Active Date MDM Diagnosis L97.422 Non-pressure chronic ulcer of left heel and midfoot with fat layer exposed 06/14/2022 No Yes Scearce, Mahitha  K ([id number]) [id number] Page 4 of 8 T81.31XS Disruption of external operation (surgical) wound, not elsewhere classified, 06/14/2022 No Yes sequela I10 Essential (primary) hypertension 06/14/2022 No Yes Inactive Problems Resolved Problems Electronic Signature(s) Signed: 07/24/2022 4:50:24 PM By: Fredirick Maudlin MD FACS Entered By: Fredirick Maudlin on 07/24/2022 16:50:24 -------------------------------------------------------------------------------- Progress Note Details Patient Name: Date of Service: [name], [place] [name] K. 07/24/2022 3:00 PM Medical Record Number: [id number] Patient Account Number: [id number] Date of Birth/Sex: Treating RN: 01/31/1956 (67 y.o. F) Primary Care Provider: Dorthy [name], [name] Other Clinician: Referring Provider: Treating Provider/Extender: Adrian Prows, [name] Weeks in Treatment: 5 Subjective Chief Complaint Information obtained from Patient Patient presents to the wound care center with open non-healing surgical wound(s) History of Present Illness  (HPI) ADMISSION 06/14/2022 This is a 67 year old otherwise quite healthy woman who underwent repair of the Haglund's deformity and ruptured Achilles tendon back in August 2023. The wound subsequently became infected and dehisced. She ended up hospitalized for the infection and has since been on multiple courses of oral antibiotics along with use of a wound VAC for a time then Betadine wet-to-dry dressing changes. [place] sure that her wound was healed and she would be able to complete her move without difficulty. She has just been using triple antibiotic ointment and a Band-Aid at this point. On the back of her left heel, there is a small circular wound in the otherwise well-healed scar line. There is some dry eschar around the edges. The wound itself is just a couple of millimeters in diameter. No concern for infection. 06/23/2022: The wound is basically unchanged in size today. There is some eschar and dried collagen accumulation. 06/30/2022: The wound is unchanged in size. There is eschar and slough accumulation. The surface appears quite healthy and I think the wound may be a bit shallower. 07/14/2022: The wound is really unchanged overall. There is some eschar and slough accumulation. 07/24/2022: Her wound is little bit narrower underneath some eschar. She has a referral in to a wound care center closer to her new home, but is waiting to confirm it is in network for her insurance. Patient History Family History Diabetes - Maternal Grandparents,Mother, Heart Disease - Maternal Grandparents,Mother, Hypertension - Maternal Grandparents,Mother, Lung Disease - Father, Stroke - Mother,Maternal Grandparents, No family history of Cancer, Hereditary Spherocytosis, Kidney Disease, Seizures, Thyroid Problems, Tuberculosis. Social History Never smoker, Marital Status - Widowed, Alcohol Use - Never, Drug Use - No History, Caffeine Use - Daily. Medical  History Eyes Patient has history of Cataracts - bilateral Denies history of Glaucoma, Optic Neuritis Ear/Nose/Mouth/Throat Denies history of Chronic sinus problems/congestion, Middle ear problems Hematologic/Lymphatic Denies history of Anemia, Hemophilia, Human Immunodeficiency Virus, Lymphedema Respiratory Denies history of Aspiration, Asthma, Chronic Obstructive Pulmonary Disease (COPD), Pneumothorax, Sleep Apnea, Tuberculosis Cardiovascular Patient has history of Arrhythmia - sinus tachycardia, Hypertension Denies history of Hypotension, Myocardial Infarction, Peripheral Arterial Disease, Peripheral Venous Disease, Phlebitis, Vasculitis Vanasten, Clyde K ([id number] Page 5 of 8 Gastrointestinal Denies history of Cirrhosis , Colitis, Crohnoos, Hepatitis A, Hepatitis B, Hepatitis C Genitourinary Denies history of End Stage Renal Disease Immunological Denies history of Lupus Erythematosus, Raynaudoos, Scleroderma Integumentary (Skin) Denies history of History of Burn Musculoskeletal Patient has history of Osteoarthritis - bilateral knees Denies history of Gout, Rheumatoid Arthritis, Osteomyelitis Neurologic Denies history of Dementia, Neuropathy, Quadriplegia, Paraplegia, Seizure Disorder Psychiatric Denies history of Anorexia/bulimia, Confinement Anxiety Objective Constitutional Hypertensive, asymptomatic. no acute distress. Vitals Time Taken:  3:26 AM, Height: 64 in, Weight: 210 lbs, BMI: 36, Temperature: 97.4 F, Pulse: 72 bpm, Respiratory Rate: 20 breaths/min, Blood Pressure: 158/91 mmHg. Respiratory Normal work of breathing on room air. General Notes: 07/24/2022: Her wound is little bit narrower underneath some eschar. Integumentary (Hair, Skin) Wound #1 status is Open. Original cause of wound was Surgical Injury. The date acquired was: 12/26/2021. The wound has been in treatment 5 weeks. The wound is located on the Left Achilles. The  wound measures 0.3cm length x 0.1cm width x 0.1cm depth; 0.024cm^2 area and 0.002cm^3 volume. There is Fat Layer (Subcutaneous Tissue) exposed. There is no tunneling or undermining noted. There is a medium amount of serous drainage noted. There is small (1-33%) pink granulation within the wound bed. There is a large (67-100%) amount of necrotic tissue within the wound bed including Eschar and Adherent Slough. The periwound skin appearance exhibited: Scarring, Dry/Scaly, Rubor. The periwound skin appearance did not exhibit: Maceration. Periwound temperature was noted as No Abnormality. Assessment Active Problems ICD-10 Non-pressure chronic ulcer of left heel and midfoot with fat layer exposed Disruption of external operation (surgical) wound, not elsewhere classified, sequela Essential (primary) hypertension Procedures Wound #1 Pre-procedure diagnosis of Wound #1 is a Dehisced Wound located on the Left Achilles . There was a Selective/Open Wound Non-Viable Tissue Debridement with a total area of 0.03 sq cm performed by Fredirick Maudlin, MD. With the following instrument(s): Curette to remove Non-Viable tissue/material. Material removed includes Eschar after achieving pain control using Lidocaine 5% topical ointment. No specimens were taken. A time out was conducted at 15:40, prior to the start of the procedure. A Minimum amount of bleeding was controlled with Pressure. The procedure was tolerated well with a pain level of 0 throughout and a pain level of 0 following the procedure. Post Debridement Measurements: 0.3cm length x 0.1cm width x 0.1cm depth; 0.002cm^3 volume. Character of Wound/Ulcer Post Debridement is improved. Post procedure Diagnosis Wound #1: Same as Pre-Procedure General Notes: Scribed for Dr. Celine Ahr by J.Scotton. Plan Follow-up Appointments: Return Appointment in 1 week. - Dr. Celine Ahr Room 3 Thursday 08/03/22 at 2 pm Other: - Pickup mupirocin (topical antibiotic) at  [place] [place] ([id number]) [id number] Page 6 of 8 Anesthetic: Wound #1 Left Achilles: (In clinic) Topical Lidocaine 4% applied to wound bed Bathing/ Shower/ Hygiene: May shower and wash wound with soap and water. - Make sure to dry wound area really well Edema Control - Lymphedema / SCD / Other: Elevate legs to the level of the heart or above for 30 minutes daily and/or when sitting for 3-4 times a day throughout the day. Exercise regularly Moisturize legs daily. WOUND #1: - Achilles Wound Laterality: Left Cleanser: Soap and Water 1 x Per Day/30 Days Discharge Instructions: May shower and wash wound with dial antibacterial soap and water prior to dressing change. Cleanser: Byram Ancillary Kit - 15 Day Supply (Generic) 1 x Per Day/30 Days Discharge Instructions: Use supplies as instructed; Kit contains: (15) Saline Bullets; (15) 3x3 Gauze; 15 pr Gloves Peri-Wound Care: Sween Lotion (Moisturizing lotion) 1 x Per Day/30 Days Discharge Instructions: Apply moisturizing lotion as directed Topical: Mupirocin Ointment 1 x Per Day/30 Days Discharge Instructions: Apply Mupirocin (Bactroban) as instructed Prim Dressing: Endoform 2x2 in (Generic) 1 x Per Day/30 Days ary Discharge Instructions: Moisten with saline Secondary Dressing: Zetuvit Plus Silicone Border Dressing 4x4 (in/in) (Generic) 1 x Per Day/30 Days Discharge Instructions: Apply silicone border over primary dressing as directed. 07/24/2022: Her wound is little bit narrower  underneath some eschar. I used a curette to debride the eschar from her wound. We will continue topical mupirocin and endoform. We will continue to see her until she is able to establish with the wound care clinic in [place]. Follow-up in 1 week. Electronic Signature(s) Signed: 07/24/2022 4:54:00 PM By: Fredirick Maudlin MD FACS Entered By: Fredirick Maudlin on 07/24/2022  16:53:59 -------------------------------------------------------------------------------- HxROS Details Patient Name: Date of Service: [name], [place] [name] K. 07/24/2022 3:00 PM Medical Record Number: [id number] Patient Account Number: [id number] Date of Birth/Sex: Treating RN: [birth year] (67 y.o. F) Primary Care Provider: Dorthy [name], [name] Other Clinician: Referring Provider: Treating Provider/Extender: Adrian Prows, [name] Weeks in Treatment: 5 Eyes Medical History: Positive for: Cataracts - bilateral Negative for: Glaucoma; Optic Neuritis Ear/Nose/Mouth/Throat Medical History: Negative for: Chronic sinus problems/congestion; Middle ear problems Hematologic/Lymphatic Medical History: Negative for: Anemia; Hemophilia; Human Immunodeficiency Virus; Lymphedema Respiratory Medical History: Negative for: Aspiration; Asthma; Chronic Obstructive Pulmonary Disease (COPD); Pneumothorax; Sleep Apnea; Tuberculosis Cardiovascular Medical History: Positive for: Arrhythmia - sinus tachycardia; Hypertension Negative for: Hypotension; Myocardial Infarction; Peripheral Arterial Disease; Peripheral Venous Disease; Phlebitis; Vasculitis Gastrointestinal Medical History: Negative for: Cirrhosis ; Colitis; Crohns; Hepatitis A; Hepatitis B; Hepatitis DEYLIN, IVENS K ([id number]) [phone].pdf Page 7 of 8 Genitourinary Medical History: Negative for: End Stage Renal Disease Immunological Medical History: Negative for: Lupus Erythematosus; Raynauds; Scleroderma Integumentary (Skin) Medical History: Negative for: History of Burn Musculoskeletal Medical History: Positive for: Osteoarthritis - bilateral knees Negative for: Gout; Rheumatoid Arthritis; Osteomyelitis Neurologic Medical History: Negative for: Dementia; Neuropathy; Quadriplegia; Paraplegia; Seizure Disorder Psychiatric Medical History: Negative for: Anorexia/bulimia; Confinement Anxiety HBO  Extended History Items Eyes: Cataracts Immunizations Pneumococcal Vaccine: Received Pneumococcal Vaccination: Yes Received Pneumococcal Vaccination On or After 60th Birthday: Yes Implantable Devices None Family and Social History Cancer: No; Diabetes: Yes - Maternal Grandparents,Mother; Heart Disease: Yes - Maternal Grandparents,Mother; Hereditary Spherocytosis: No; Hypertension: Yes - Maternal Grandparents,Mother; Kidney Disease: No; Lung Disease: Yes - Father; Seizures: No; Stroke: Yes - Mother,Maternal Grandparents; Thyroid Problems: No; Tuberculosis: No; Never smoker; Marital Status - Widowed; Alcohol Use: Never; Drug Use: No History; Caffeine Use: Daily; Financial Concerns: No; Food, Clothing or Shelter Needs: No; Support System Lacking: No; Transportation Concerns: No Electronic Signature(s) Signed: 07/24/2022 5:04:49 PM By: Fredirick Maudlin MD FACS Entered By: Fredirick Maudlin on 07/24/2022 16:51:59 -------------------------------------------------------------------------------- SuperBill Details Patient Name: Date of Service: [facility] DES, [place] [name] K. 07/24/2022 Medical Record Number: [id number] Patient Account Number: [id number] Date of Birth/Sex: Treating RN: Dec 29, 1955 (67 y.o. F) Primary Care Provider: Dorthy [name], [name] Other Clinician: Referring Provider: Treating Provider/Extender: Adrian Prows, [name] Weeks in Treatment: 5 Diagnosis Coding ICD-10 Codes Code Description [phone] Non-pressure chronic ulcer of left heel and midfoot with fat layer exposed T81.31XS Disruption of external operation (surgical) wound, not elsewhere classified, sequela I10 Essential (primary) hypertension Capraro, Haiven K ([id number]) [id number] Page 8 of 8 Facility Procedures : CPT4 Code: [id number] Description: [phone] - DEBRIDE WOUND 1ST 20 SQ CM OR < ICD-10 Diagnosis Description L97.422 Non-pressure chronic ulcer of left heel and midfoot with fat layer  exposed Modifier: Quantity: 1 Physician Procedures : CPT4 Code Description Modifier [id number] - WC PHYS LEVEL 4 - EST PT 25 ICD-10 Diagnosis Description L97.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

## 2022-08-03 ENCOUNTER — Encounter (HOSPITAL_BASED_OUTPATIENT_CLINIC_OR_DEPARTMENT_OTHER): Payer: Medicare PPO | Admitting: General Surgery

## 2022-08-03 DIAGNOSIS — L97422 Non-pressure chronic ulcer of left heel and midfoot with fat layer exposed: Secondary | ICD-10-CM | POA: Diagnosis not present

## 2022-08-04 ENCOUNTER — Encounter: Payer: Self-pay | Admitting: Podiatry

## 2022-08-10 ENCOUNTER — Encounter (HOSPITAL_BASED_OUTPATIENT_CLINIC_OR_DEPARTMENT_OTHER): Payer: Medicare PPO | Admitting: General Surgery

## 2022-08-10 ENCOUNTER — Other Ambulatory Visit: Payer: Self-pay | Admitting: Podiatry

## 2022-08-10 DIAGNOSIS — L97422 Non-pressure chronic ulcer of left heel and midfoot with fat layer exposed: Secondary | ICD-10-CM | POA: Diagnosis not present

## 2022-08-10 DIAGNOSIS — S91302A Unspecified open wound, left foot, initial encounter: Secondary | ICD-10-CM

## 2022-08-10 NOTE — Progress Notes (Signed)
[name], [name] ([id number]) [id number] Page 1 of 7 Visit Report for 08/03/2022 Arrival Information Details Patient Name: Date of Service: [facility] DES, [place] [name]. 08/03/2022 2:00 PM Medical Record Number: [id number] Patient Account Number: [id number] Date of Birth/Sex: Treating RN: Jul 30, 1955 (67 y.o. [name] Primary Care Janitza Revuelta: Docia Chuck, [name] Other Clinician: Referring [name]: Treating [name]/Extender: [name], [name] Weeks in Treatment: 7 Visit Information History Since Last Visit All ordered tests and consults were completed: Yes Patient Arrived: Ambulatory Added or deleted any medications: No Arrival Time: 14:21 Any new allergies or adverse reactions: No Accompanied By: self Had a fall or experienced change in No Transfer Assistance: None activities of daily living that may affect Patient Identification Verified: Yes risk of falls: Secondary Verification Process Completed: Yes Signs or symptoms of abuse/neglect since last visito No Patient Requires Transmission-Based Precautions: No Hospitalized since last visit: No Patient Has Alerts: Yes Implantable device outside of the clinic excluding No Patient Alerts: ABI LLE 1.11 cellular tissue based products placed in the center ABI RLE 1.04 since last visit: Has Dressing in Place as Prescribed: Yes Pain Present Now: Yes Electronic Signature(s) Signed: 08/10/2022 1:56:54 PM By: Brenton Grills Entered By: Brenton Grills on 08/03/2022 14:22:02 -------------------------------------------------------------------------------- Encounter Discharge Information Details Patient Name: Date of Service: [name]. 08/03/2022 2:00 PM Medical Record Number: [id number] Patient Account Number: [id number] Date of Birth/Sex: Treating RN: 09-Nov-1955 (67 y.o. [name] Primary Care Tyresha Fede: Docia Chuck, [name] Other Clinician: Referring Dannie Hattabaugh: Treating [name]/Extender:  [name], [name] Weeks in Treatment: 7 Encounter Discharge Information Items Post Procedure Vitals Discharge Condition: Stable Temperature (F): 97.5 Ambulatory Status: Ambulatory Pulse (bpm): 84 Discharge Destination: Home Respiratory Rate (breaths/min): 18 Transportation: Private Auto Blood Pressure (mmHg): 135/82 Accompanied By: self Schedule Follow-up Appointment: Yes Clinical Summary of Care: Patient Declined Electronic Signature(s) Signed: 08/10/2022 1:56:54 PM By: Brenton Grills Entered By: Brenton Grills on 08/03/2022 15:06:52 -------------------------------------------------------------------------------- Lower Extremity Assessment Details Patient Name: Date of Service: [facility] DES, [name]. 08/03/2022 2:00 PM Medical Record Number: [id number] Patient Account Number: [id number] Date of Birth/Sex: Treating RN: [birth year] (67 y.o. [name] Primary Care Cheyeanne Roadcap: Docia Chuck, [name] Other Clinician: Referring Willodene Stallings: Treating [name]/Extender: [name], [name] Weeks in Treatment: 7 Edema Assessment R[LeftMARKEIA, [name] ([id number])] [Right: [id number] Page 2 of 7] Assessed: [Left: No] [Right: No] [Left: Edema] [Right: :] Calf Left: Right: Point of Measurement: From Medial Instep 41 cm Ankle Left: Right: Point of Measurement: From Medial Instep 24.5 cm Vascular Assessment Pulses: Dorsalis Pedis Palpable: [Left:Yes] Electronic Signature(s) Signed: 08/10/2022 1:56:54 PM By: Brenton Grills Entered By: Brenton Grills on 08/03/2022 14:26:16 -------------------------------------------------------------------------------- Multi Wound Chart Details Patient Name: Date of Service: [facility] DES, [name]. 08/03/2022 2:00 PM Medical Record Number: [id number] Patient Account Number: [id number] Date of Birth/Sex: Treating RN: January 07, 1956 (67 y.o. F) Primary Care [name]: Docia Chuck, [name] Other Clinician: Referring  Kamira Mellette: Treating Valyn Latchford/Extender: [name], [name] Weeks in Treatment: 7 Vital Signs Height(in): 64 Pulse(bpm): 75 Weight(lbs): 210 Blood Pressure(mmHg): 131/82 Body Mass Index(BMI): 36 Temperature(F): 131/82 Respiratory Rate(breaths/min): 18 [1:Photos:] [N/A:N/A] Left Achilles Left Achilles N/A Wound Location: Surgical Injury Surgical Injury N/A Wounding Event: Dehisced Wound Dehisced Wound N/A Primary Etiology: Cataracts, Arrhythmia, Hypertension, Cataracts, Arrhythmia, Hypertension, N/A Comorbid History: Osteoarthritis Osteoarthritis 12/26/2021 12/26/2021 N/A Date Acquired: 7 7 N/A Weeks of Treatment: Open Open N/A Wound Status: No No N/A Wound Recurrence: 0.3x0.1x0.1 0.3x0.1x0.1 N/A Measurements L x W x D (cm) 0.024 0.024 N/A A (cm) : rea 0.002  0.002 N/A Volume (cm) : 66.20% 66.20% N/A % Reduction in Area: [name] ([id number]) [id number] Page 3 of 7 71.40% 71.40% N/A % Reduction in Volume: Full Thickness Without Exposed Full Thickness Without Exposed N/A Classification: Support Structures Support Structures Medium Medium N/A Exudate A mount: Serous Serous N/A Exudate Type: amber amber N/A Exudate Color: Small (1-33%) Small (1-33%) N/A Granulation A mount: Pink Pink N/A Granulation Quality: Large (67-100%) Large (67-100%) N/A Necrotic A mount: Eschar, Adherent Slough Eschar, Adherent Slough N/A Necrotic Tissue: Fat Layer (Subcutaneous Tissue): Yes Fat Layer (Subcutaneous Tissue): Yes N/A Exposed Structures: Fascia: No Fascia: No Tendon: No Tendon: No Muscle: No Muscle: No Joint: No Joint: No Bone: No Bone: No Small (1-33%) Small (1-33%) N/A Epithelialization: Debridement - Selective/Open Wound Debridement - Selective/Open Wound N/A Debridement: Pre-procedure Verification/Time Out 14:46 14:46 N/A Taken: Lidocaine 4% Topical Solution Lidocaine 4% Topical Solution N/A Pain  Control: Necrotic/Eschar Necrotic/Eschar N/A Tissue Debrided: Non-Viable Tissue Non-Viable Tissue N/A Level: 0.03 0.03 N/A Debridement A (sq cm): rea Curette Curette N/A Instrument: Minimum Minimum N/A Bleeding: Pressure Pressure N/A Hemostasis A chieved: 0 0 N/A Procedural Pain: 0 0 N/A Post Procedural Pain: Procedure was tolerated well Procedure was tolerated well N/A Debridement Treatment Response: 0.3x0.1x0.1 0.3x0.1x0.1 N/A Post Debridement Measurements L x W x D (cm) 0.002 0.002 N/A Post Debridement Volume: (cm) Scarring: Yes Scarring: Yes N/A Periwound Skin Texture: Dry/Scaly: Yes Dry/Scaly: Yes N/A Periwound Skin Moisture: Maceration: No Maceration: No Rubor: Yes Rubor: Yes N/A Periwound Skin Color: No Abnormality No Abnormality N/A Temperature: Yes Yes N/A Tenderness on Palpation: Debridement Debridement N/A Procedures Performed: Treatment Notes Electronic Signature(s) Signed: 08/03/2022 3:03:53 PM By: Duanne Guess MD FACS Entered By: Duanne Guess on 08/03/2022 15:03:53 -------------------------------------------------------------------------------- Multi-Disciplinary Care Plan Details Patient Name: Date of Service: [facility] DES, [name]. 08/03/2022 2:00 PM Medical Record Number: [id number] Patient Account Number: [id number] Date of Birth/Sex: Treating RN: 24-Oct-1955 (67 y.o. [name] Primary Care Makai Agostinelli: Docia Chuck, [name] Other Clinician: Referring Djimon Lundstrom: Treating Attallah Ontko/Extender: [name], [name] Weeks in Treatment: 7 Active Inactive Wound/Skin Impairment Nursing Diagnoses: Impaired tissue integrity Goals: Ulcer/skin breakdown will have a volume reduction of 30% by week 4 Date Initiated: 06/23/2022 Target Resolution Date: 08/22/2022 Goal Status: Active Interventions: Assess ulceration(s) every visit Provide education on ulcer and skin care Treatment Activities: Skin care regimen initiated : 06/23/2022 DASHANTI, BURR ([id number]) [id number] Page 4 of 7 Notes: Electronic Signature(s) Signed: 08/10/2022 1:56:54 PM By: Brenton Grills Entered By: Brenton Grills on 08/03/2022 14:36:05 -------------------------------------------------------------------------------- Pain Assessment Details Patient Name: Date of Service: [facility] DES, [name]. 08/03/2022 2:00 PM Medical Record Number: [id number] Patient Account Number: [id number] Date of Birth/Sex: Treating RN: 09/05/1955 (67 y.o. [name] Primary Care Yazleemar Strassner: Docia Chuck, [name] Other Clinician: Referring Toris Laverdiere: Treating Elisabet Gutzmer/Extender: [name], [name] Weeks in Treatment: 7 Active Problems Location of Pain Severity and Description of Pain Patient Has Paino Yes Site Locations Rate the pain. Current Pain Level: 3 Worst Pain Level: 3 Least Pain Level: 3 Character of Pain Describe the Pain: Aching Pain Management and Medication Current Pain Management: Medication: Yes Electronic Signature(s) Signed: 08/10/2022 1:56:54 PM By: Brenton Grills Entered By: Brenton Grills on 08/03/2022 14:23:47 -------------------------------------------------------------------------------- Patient/Caregiver Education Details Patient Name: Date of Service: [facility] DES, [name] [name] 4/11/2024andnbsp2:00 PM Medical Record Number: [id number] Patient Account Number: [id number] Date of Birth/Gender: Treating RN: 15-Oct-1955 (67 y.o. [name] Primary Care Physician: Docia Chuck, [place] Other Clinician: Referring Physician: Treating Physician/Extender: [name], [name] Tania Ade  in Treatment: 7 Education Assessment Education Provided To: Patient Education Topics Provided Wound/Skin ImpairmentLABRIA, [name] ([id number]) [id number] Page 5 of 7 Methods: Explain/Verbal Responses: State content correctly Electronic Signature(s) Signed: 08/10/2022 1:56:54 PM By: Brenton Grills Entered By: Brenton Grills on 08/03/2022 14:36:32 -------------------------------------------------------------------------------- Wound Assessment Details Patient Name: Date of Service: [facility] DES, [name]. 08/03/2022 2:00 PM Medical Record Number: [id number] Patient Account Number: [id number] Date of Birth/Sex: Treating RN: Nov 11, 1955 (67 y.o. [name] Primary Care Saeed Toren: Docia Chuck, [name] Other Clinician: Referring Jyrah Blye: Treating Annisha Baar/Extender: [name], [name] Weeks in Treatment: 7 Wound Status Wound Number: 1 Primary Etiology: Dehisced Wound Wound Location: Left Achilles Wound Status: Open Wounding Event: Surgical Injury Comorbid History: Cataracts, Arrhythmia, Hypertension, Osteoarthritis Date Acquired: 12/26/2021 Weeks Of Treatment: 7 Clustered Wound: No Photos Wound Measurements Length: (cm) 0.3 Width: (cm) 0.1 Depth: (cm) 0.1 Area: (cm) 0.024 Volume: (cm) 0.002 % Reduction in Area: 66.2% % Reduction in Volume: 71.4% Epithelialization: Small (1-33%) Tunneling: No Undermining: No Wound Description Classification: Full Thickness Without Exposed Support Structures Exudate Amount: Medium Exudate Type: Serous Exudate Color: amber Foul Odor After Cleansing: No Slough/Fibrino Yes Wound Bed Granulation Amount: Small (1-33%) Exposed Structure Granulation Quality: Pink Fascia Exposed: No Necrotic Amount: Large (67-100%) Fat Layer (Subcutaneous Tissue) Exposed: Yes Necrotic Quality: Eschar, Adherent Slough Tendon Exposed: No Muscle Exposed: No Joint Exposed: No Bone Exposed: No Periwound Skin Texture Texture Color No Abnormalities Noted: No No Abnormalities Noted: No Scarring: Yes Rubor: Yes Moisture Temperature / Pain No Abnormalities Noted: No Temperature: No Abnormality Dry / Scaly: Yes Tenderness on Palpation: Yes Maceration: No [name], [name] ([id number]) [id number] Page 6 of  7 Electronic Signature(s) Signed: 08/10/2022 1:56:54 PM By: Brenton Grills Entered By: Brenton Grills on 08/03/2022 14:35:07 -------------------------------------------------------------------------------- Wound Assessment Details Patient Name: Date of Service: [facility] DES, [name]. 08/03/2022 2:00 PM Medical Record Number: [id number] Patient Account Number: [id number] Date of Birth/Sex: Treating RN: 06-Nov-1955 (67 y.o. [name] Primary Care Embrie Mikkelsen: Docia Chuck, [name] Other Clinician: Referring Adisa Litt: Treating Arlinda Barcelona/Extender: [name], [name] Weeks in Treatment: 7 Wound Status Wound Number: 1 Primary Etiology: Dehisced Wound Wound Location: Left Achilles Wound Status: Open Wounding Event: Surgical Injury Comorbid History: Cataracts, Arrhythmia, Hypertension, Osteoarthritis Date Acquired: 12/26/2021 Weeks Of Treatment: 7 Clustered Wound: No Photos Wound Measurements Length: (cm) 0.3 Width: (cm) 0.1 Depth: (cm) 0.1 Area: (cm) 0.024 Volume: (cm) 0.002 % Reduction in Area: 66.2% % Reduction in Volume: 71.4% Epithelialization: Small (1-33%) Tunneling: No Undermining: No Wound Description Classification: Full Thickness Without Exposed Support Structures Exudate Amount: Medium Exudate Type: Serous Exudate Color: amber Foul Odor After Cleansing: No Slough/Fibrino Yes Wound Bed Granulation Amount: Small (1-33%) Exposed Structure Granulation Quality: Pink Fascia Exposed: No Necrotic Amount: Large (67-100%) Fat Layer (Subcutaneous Tissue) Exposed: Yes Necrotic Quality: Eschar, Adherent Slough Tendon Exposed: No Muscle Exposed: No Joint Exposed: No Bone Exposed: No Periwound Skin Texture Texture Color No Abnormalities Noted: No No Abnormalities Noted: No Scarring: Yes Rubor: Yes Moisture Temperature / Pain No Abnormalities Noted: No Temperature: No Abnormality Dry / Scaly: Yes Tenderness on Palpation: Yes Maceration: No Electronic  Signature(s) [name], [name] ([id number]) [id number] Page 7 of 7 Signed: 08/10/2022 1:56:54 PM By: Brenton Grills Entered By: Brenton Grills on 08/03/2022 14:35:42 -------------------------------------------------------------------------------- Vitals Details Patient Name: Date of Service: [facility] DES, [name]. 08/03/2022 2:00 PM Medical Record Number: [id number] Patient Account Number: [id number] Date of Birth/Sex: Treating RN: April 11, 1956 (67 y.o. [name] Primary Care Daily Doe: Docia Chuck, [name] Other Clinician:  Referring Leonarda Leis: Treating Karolee Meloni/Extender: [name], [name] Weeks in Treatment: 7 Vital Signs Time Taken: 14:22 Temperature (F): 97.5 Height (in): 64 Pulse (bpm): 75 Weight (lbs): 210 Respiratory Rate (breaths/min): 18 Body Mass Index (BMI): 36 Blood Pressure (mmHg): 131/82 Reference Range: 80 - 120 mg / dl Electronic Signature(s) Signed: 08/10/2022 1:56:54 PM By: Brenton Grills Entered By: Brenton Grills on 08/03/2022 15:06:32

## 2022-08-10 NOTE — Progress Notes (Signed)
GRAY, MAUGERI ([id number]) [id number] Page 1 of 8 Visit Report for 08/03/2022 Chief Complaint Document Details Patient Name: Date of Service: [facility] DES, DA [name] 08/03/2022 2:00 PM Medical Record Number: [id number] Patient Account Number: [id number] Date of Birth/Sex: Treating RN: 09-21-1955 (67 y.o. F) Primary Care Provider: Docia Chuck, [name] Other Clinician: Referring Provider: Treating Provider/Extender: Leroy Libman, [name] Weeks in Treatment: 7 Information Obtained from: Patient Chief Complaint Patient presents to the wound care center with open non-healing surgical wound(s) Electronic Signature(s) Signed: 08/03/2022 3:04:00 PM By: Duanne Guess MD FACS Entered By: Duanne Guess on 08/03/2022 15:04:00 -------------------------------------------------------------------------------- Debridement Details Patient Name: Date of Service: [facility] DES, DA [name] 08/03/2022 2:00 PM Medical Record Number: [id number] Patient Account Number: [id number] Date of Birth/Sex: Treating RN: [birth year] (67 y.o. [name] Primary Care Provider: Docia Chuck, [name] Other Clinician: Referring Provider: Treating Provider/Extender: Leroy Libman, [name] Weeks in Treatment: 7 Debridement Performed for Assessment: Wound #1 Left Achilles Performed By: Physician Duanne Guess, MD Debridement Type: Debridement Level of Consciousness (Pre-procedure): Awake and Alert Pre-procedure Verification/Time Out Yes - 14:46 Taken: Start Time: 14:46 Pain Control: Lidocaine 4% T opical Solution T Area Debrided (L x W): otal 0.3 (cm) x 0.1 (cm) = 0.03 (cm) Tissue and other material debrided: Non-Viable, Eschar Level: Non-Viable Tissue Debridement Description: Selective/Open Wound Instrument: Curette Bleeding: Minimum Hemostasis Achieved: Pressure End Time: 14:48 Procedural Pain: 0 Post Procedural Pain: 0 Response to Treatment: Procedure was tolerated  well Level of Consciousness (Post- Awake and Alert procedure): Post Debridement Measurements of Total Wound Length: (cm) 0.3 Width: (cm) 0.1 Depth: (cm) 0.1 Volume: (cm) 0.002 Character of Wound/Ulcer Post Debridement: Stable Post Procedure Diagnosis Same as Pre-procedure Notes scribed for Dr. Lady Gary by Brenton Grills RN Electronic Signature(s) Signed: 08/03/2022 3:21:47 PM By: Duanne Guess MD FACS Signed: 08/10/2022 1:56:54 PM By: Bonney Aid, Earna Coder ([id number]) [id number] Page 2 of 8 Entered By: Brenton Grills on 08/03/2022 15:00:32 -------------------------------------------------------------------------------- HPI Details Patient Name: Date of Service: [facility] DES, DA [name] 08/03/2022 2:00 PM Medical Record Number: [id number] Patient Account Number: [id number] Date of Birth/Sex: Treating RN: Jan 01, 1956 (67 y.o. F) Primary Care Provider: Docia Chuck, [name] Other Clinician: Referring Provider: Treating Provider/Extender: Leroy Libman, [name] Weeks in Treatment: 7 History of Present Illness HPI Description: ADMISSION 06/14/2022 This is a 67 year old otherwise quite healthy woman who underwent repair of the Haglund's deformity and ruptured Achilles tendon back in August 2023. The wound subsequently became infected and dehisced. She ended up hospitalized for the infection and has since been on multiple courses of oral antibiotics along with use of a wound VAC for a time then Betadine wet-to-dry dressing changes. [place] sure that her wound was healed and she would be able to complete her move without difficulty. She has just been using triple antibiotic ointment and a Band-Aid at this point. On the back of her left heel, there is a small circular wound in the otherwise well-healed scar line. There is some dry eschar around the edges. The wound itself is just a couple of  millimeters in diameter. No concern for infection. 06/23/2022: The wound is basically unchanged in size today. There is some eschar and dried collagen accumulation. 06/30/2022: The wound is unchanged in size. There is eschar and slough accumulation. The surface appears quite healthy and I think the wound may be a bit shallower. 07/14/2022: The wound is really unchanged overall. There is some eschar and slough  accumulation. 07/24/2022: Her wound is little bit narrower underneath some eschar. She has a referral in to a wound care center closer to her new home, but is waiting to confirm it is in network for her insurance. 08/03/2022: There is not seem to be much significant change to her wound. There is some slough and eschar that has reaccumulated. She is waiting until May to begin treatment at the wound care center near her new home. Electronic Signature(s) Signed: 08/03/2022 3:04:43 PM By: Duanne Guess MD FACS Entered By: Duanne Guess on 08/03/2022 15:04:43 -------------------------------------------------------------------------------- Physical Exam Details Patient Name: Date of Service: [name], DA [name] 08/03/2022 2:00 PM Medical Record Number: [id number] Patient Account Number: [id number] Date of Birth/Sex: Treating RN: 1955/12/06 (67 y.o. F) Primary Care Provider: Docia Chuck, [name] Other Clinician: Referring Provider: Treating Provider/Extender: Leroy Libman, [name] Weeks in Treatment: 7 Constitutional . . . . no acute distress. Respiratory Normal work of breathing on room air. Notes 08/03/2022: There is not seem to be much significant change to her wound. There is some slough and eschar that has reaccumulated. Electronic Signature(s) Signed: 08/03/2022 3:06:31 PM By: Duanne Guess MD FACS Entered By: Duanne Guess on 08/03/2022 15:06:31 -------------------------------------------------------------------------------- Physician Orders Details Patient Name: Date of  Service: [facility] DES, DA [name] 08/03/2022 2:00 PM Medical Record Number: [id number] Patient Account Number: [id number] Date of Birth/Sex: Treating RN: 21-Oct-1955 (67 y.o. Mahogani, Holohan, Mishelle Kirtland Bouchard ([id number]) [phone].pdf Page 3 of 8 Primary Care Provider: Purdy, [name] Other Clinician: Referring Provider: Treating Provider/Extender: Leroy Libman, [name] Weeks in Treatment: 7 Verbal / Phone Orders: No Diagnosis Coding ICD-10 Coding Code Description L97.422 Non-pressure chronic ulcer of left heel and midfoot with fat layer exposed T81.31XS Disruption of external operation (surgical) wound, not elsewhere classified, sequela I10 Essential (primary) hypertension Follow-up Appointments ppointment in 1 week. - Dr. Lady Gary Room 3 Return A Thursday 08/03/22 at 2 pm Other: - Pickup mupirocin (topical antibiotic) at pharmacy Anesthetic Wound #1 Left Achilles (In clinic) Topical Lidocaine 4% applied to wound bed Bathing/ Shower/ Hygiene May shower and wash wound with soap and water. - Make sure to dry wound area really well Edema Control - Lymphedema / SCD / Other Left Lower Extremity Elevate legs to the level of the heart or above for 30 minutes daily and/or when sitting for 3-4 times a day throughout the day. Exercise regularly Moisturize legs daily. Wound Treatment Wound #1 - Achilles Wound Laterality: Left Cleanser: Soap and Water 1 x Per Day/30 Days Discharge Instructions: May shower and wash wound with dial antibacterial soap and water prior to dressing change. Cleanser: Byram Ancillary Kit - 15 Day Supply (Generic) 1 x Per Day/30 Days Discharge Instructions: Use supplies as instructed; Kit contains: (15) Saline Bullets; (15) 3x3 Gauze; 15 pr Gloves Peri-Wound Care: Sween Lotion (Moisturizing lotion) 1 x Per Day/30 Days Discharge Instructions: Apply moisturizing lotion as directed Topical: Mupirocin Ointment 1 x Per Day/30 Days Discharge  Instructions: Apply Mupirocin (Bactroban) as instructed Prim Dressing: Endoform 2x2 in (Generic) 1 x Per Day/30 Days ary Discharge Instructions: Moisten with saline Secondary Dressing: Zetuvit Plus Silicone Border Dressing 4x4 (in/in) (Generic) 1 x Per Day/30 Days Discharge Instructions: Apply silicone border over primary dressing as directed. Electronic Signature(s) Signed: 08/03/2022 3:21:47 PM By: Duanne Guess MD FACS Entered By: Duanne Guess on 08/03/2022 15:06:41 -------------------------------------------------------------------------------- Problem List Details Patient Name: Date of Service: [facility] DES, DA [name] 08/03/2022 2:00 PM Medical Record Number: [id number] Patient Account Number: [id number] Date of Birth/Sex: Treating  RN: [birth year] (67 y.o. [name] Primary Care Provider: Docia Chuck, [name] Other Clinician: Referring Provider: Treating Provider/Extender: Leroy Libman, [name] Weeks in Treatment: 7 Active Problems ICD-10 Encounter Code Description Active Date MDM Diagnosis LIANAH, PEED ([id number]) [id number] Page 4 of 8 [phone] Non-pressure chronic ulcer of left heel and midfoot with fat layer exposed 06/14/2022 No Yes T81.31XS Disruption of external operation (surgical) wound, not elsewhere classified, 06/14/2022 No Yes sequela I10 Essential (primary) hypertension 06/14/2022 No Yes Inactive Problems Resolved Problems Electronic Signature(s) Signed: 08/03/2022 3:03:43 PM By: Duanne Guess MD FACS Entered By: Duanne Guess on 08/03/2022 15:03:42 -------------------------------------------------------------------------------- Progress Note Details Patient Name: Date of Service: [name], DA [name] 08/03/2022 2:00 PM Medical Record Number: [id number] Patient Account Number: [id number] Date of Birth/Sex: Treating RN: [birth year] (67 y.o. F) Primary Care Provider: Docia Chuck, [name] Other Clinician: Referring  Provider: Treating Provider/Extender: Leroy Libman, [name] Weeks in Treatment: 7 Subjective Chief Complaint Information obtained from Patient Patient presents to the wound care center with open non-healing surgical wound(s) History of Present Illness (HPI) ADMISSION 06/14/2022 This is a 67 year old otherwise quite healthy woman who underwent repair of the Haglund's deformity and ruptured Achilles tendon back in August 2023. The wound subsequently became infected and dehisced. She ended up hospitalized for the infection and has since been on multiple courses of oral antibiotics along with use of a wound VAC for a time then Betadine wet-to-dry dressing changes. [place] sure that her wound was healed and she would be able to complete her move without difficulty. She has just been using triple antibiotic ointment and a Band-Aid at this point. On the back of her left heel, there is a small circular wound in the otherwise well-healed scar line. There is some dry eschar around the edges. The wound itself is just a couple of millimeters in diameter. No concern for infection. 06/23/2022: The wound is basically unchanged in size today. There is some eschar and dried collagen accumulation. 06/30/2022: The wound is unchanged in size. There is eschar and slough accumulation. The surface appears quite healthy and I think the wound may be a bit shallower. 07/14/2022: The wound is really unchanged overall. There is some eschar and slough accumulation. 07/24/2022: Her wound is little bit narrower underneath some eschar. She has a referral in to a wound care center closer to her new home, but is waiting to confirm it is in network for her insurance. 08/03/2022: There is not seem to be much significant change to her wound. There is some slough and eschar that has reaccumulated. She is waiting until May to begin treatment at the wound care center near her  new home. Patient History Family History Diabetes - Maternal Grandparents,Mother, Heart Disease - Maternal Grandparents,Mother, Hypertension - Maternal Grandparents,Mother, Lung Disease - Father, Stroke - Mother,Maternal Grandparents, No family history of Cancer, Hereditary Spherocytosis, Kidney Disease, Seizures, Thyroid Problems, Tuberculosis. Social History Never smoker, Marital Status - Widowed, Alcohol Use - Never, Drug Use - No History, Caffeine Use - Daily. Medical History Eyes Patient has history of Cataracts - bilateral Denies history of Glaucoma, Optic Neuritis Ear/Nose/Mouth/Throat Denies history of Chronic sinus problems/congestion, Middle ear problems Hematologic/Lymphatic MADDELYN, ROCCA ([id number]) [id number] Page 5 of 8 Denies history of Anemia, Hemophilia, Human Immunodeficiency Virus, Lymphedema Respiratory Denies history of Aspiration, Asthma, Chronic Obstructive Pulmonary Disease (COPD), Pneumothorax, Sleep Apnea, Tuberculosis Cardiovascular Patient has history of Arrhythmia - sinus tachycardia, Hypertension Denies history  of Hypotension, Myocardial Infarction, Peripheral Arterial Disease, Peripheral Venous Disease, Phlebitis, Vasculitis Gastrointestinal Denies history of Cirrhosis , Colitis, Crohnoos, Hepatitis A, Hepatitis B, Hepatitis C Genitourinary Denies history of End Stage Renal Disease Immunological Denies history of Lupus Erythematosus, Raynaudoos, Scleroderma Integumentary (Skin) Denies history of History of Burn Musculoskeletal Patient has history of Osteoarthritis - bilateral knees Denies history of Gout, Rheumatoid Arthritis, Osteomyelitis Neurologic Denies history of Dementia, Neuropathy, Quadriplegia, Paraplegia, Seizure Disorder Psychiatric Denies history of Anorexia/bulimia, Confinement Anxiety Objective Constitutional no acute distress. Vitals Time Taken: 2:22 PM, Height: 64 in, Weight: 210 lbs, BMI: 36,  Temperature: 97.5 F, Pulse: 75 bpm, Respiratory Rate: 18 breaths/min, Blood Pressure: 131/82 mmHg. Respiratory Normal work of breathing on room air. General Notes: 08/03/2022: There is not seem to be much significant change to her wound. There is some slough and eschar that has reaccumulated. Integumentary (Hair, Skin) Wound #1 status is Open. Original cause of wound was Surgical Injury. The date acquired was: 12/26/2021. The wound has been in treatment 7 weeks. The wound is located on the Left Achilles. The wound measures 0.3cm length x 0.1cm width x 0.1cm depth; 0.024cm^2 area and 0.002cm^3 volume. There is Fat Layer (Subcutaneous Tissue) exposed. There is no tunneling or undermining noted. There is a medium amount of serous drainage noted. There is small (1-33%) pink granulation within the wound bed. There is a large (67-100%) amount of necrotic tissue within the wound bed including Eschar and Adherent Slough. The periwound skin appearance exhibited: Scarring, Dry/Scaly, Rubor. The periwound skin appearance did not exhibit: Maceration. Periwound temperature was noted as No Abnormality. The periwound has tenderness on palpation. Wound #1 status is Open. Original cause of wound was Surgical Injury. The date acquired was: 12/26/2021. The wound has been in treatment 7 weeks. The wound is located on the Left Achilles. The wound measures 0.3cm length x 0.1cm width x 0.1cm depth; 0.024cm^2 area and 0.002cm^3 volume. There is Fat Layer (Subcutaneous Tissue) exposed. There is no tunneling or undermining noted. There is a medium amount of serous drainage noted. There is small (1-33%) pink granulation within the wound bed. There is a large (67-100%) amount of necrotic tissue within the wound bed including Eschar and Adherent Slough. The periwound skin appearance exhibited: Scarring, Dry/Scaly, Rubor. The periwound skin appearance did not exhibit: Maceration. Periwound temperature was noted as No Abnormality.  The periwound has tenderness on palpation. Assessment Active Problems ICD-10 Non-pressure chronic ulcer of left heel and midfoot with fat layer exposed Disruption of external operation (surgical) wound, not elsewhere classified, sequela Essential (primary) hypertension Procedures Wound #1 Pre-procedure diagnosis of Wound #1 is a Dehisced Wound located on the Left Achilles . There was a Selective/Open Wound Non-Viable Tissue Debridement with a total area of 0.03 sq cm performed by Duanne Guess, MD. With the following instrument(s): Curette to remove Non-Viable tissue/material. Material removed includes Eschar after achieving pain control using Lidocaine 4% T opical Solution. No specimens were taken. A time out was conducted at 14:46, prior to the start of the procedure. A Minimum amount of bleeding was controlled with Pressure. The procedure was tolerated well with a pain level of 0 throughout and a pain level of 0 following the procedure. Post Debridement Measurements: 0.3cm length x 0.1cm width x 0.1cm depth; 0.002cm^3 volume. Character of Wound/Ulcer Post Debridement is stable. HAYDON, KALMAR ([id number]) [id number] Page 6 of 8 Post procedure Diagnosis Wound #1: Same as Pre-Procedure General Notes: scribed for Dr. Lady Gary by Brenton Grills RN. Plan Follow-up Appointments: Return  Appointment in 1 week. - Dr. Lady Gary Room 3 Thursday 08/03/22 at 2 pm Other: - Pickup mupirocin (topical antibiotic) at pharmacy Anesthetic: Wound #1 Left Achilles: (In clinic) Topical Lidocaine 4% applied to wound bed Bathing/ Shower/ Hygiene: May shower and wash wound with soap and water. - Make sure to dry wound area really well Edema Control - Lymphedema / SCD / Other: Elevate legs to the level of the heart or above for 30 minutes daily and/or when sitting for 3-4 times a day throughout the day. Exercise regularly Moisturize legs daily. WOUND #1: - Achilles Wound Laterality:  Left Cleanser: Soap and Water 1 x Per Day/30 Days Discharge Instructions: May shower and wash wound with dial antibacterial soap and water prior to dressing change. Cleanser: Byram Ancillary Kit - 15 Day Supply (Generic) 1 x Per Day/30 Days Discharge Instructions: Use supplies as instructed; Kit contains: (15) Saline Bullets; (15) 3x3 Gauze; 15 pr Gloves Peri-Wound Care: Sween Lotion (Moisturizing lotion) 1 x Per Day/30 Days Discharge Instructions: Apply moisturizing lotion as directed Topical: Mupirocin Ointment 1 x Per Day/30 Days Discharge Instructions: Apply Mupirocin (Bactroban) as instructed Prim Dressing: Endoform 2x2 in (Generic) 1 x Per Day/30 Days ary Discharge Instructions: Moisten with saline Secondary Dressing: Zetuvit Plus Silicone Border Dressing 4x4 (in/in) (Generic) 1 x Per Day/30 Days Discharge Instructions: Apply silicone border over primary dressing as directed. 08/03/2022: There is not seem to be much significant change to her wound. There is some slough and eschar that has reaccumulated. I used a curette to debride scar and eschar from the wound. We will continue topical mupirocin with endoform. Part of the challenge in getting this wound to heal is that it is on the Achilles area and is in a scar tissue bed. Follow-up in 1 week. Electronic Signature(s) Signed: 08/03/2022 3:08:56 PM By: Duanne Guess MD FACS Entered By: Duanne Guess on 08/03/2022 15:08:56 -------------------------------------------------------------------------------- HxROS Details Patient Name: Date of Service: [name], DA [name] 08/03/2022 2:00 PM Medical Record Number: [id number] Patient Account Number: [id number] Date of Birth/Sex: Treating RN: 20-Feb-1956 (67 y.o. F) Primary Care Provider: Docia Chuck, [name] Other Clinician: Referring Provider: Treating Provider/Extender: Leroy Libman, [name] Weeks in Treatment: 7 Eyes Medical History: Positive for: Cataracts -  bilateral Negative for: Glaucoma; Optic Neuritis Ear/Nose/Mouth/Throat Medical History: Negative for: Chronic sinus problems/congestion; Middle ear problems Hematologic/Lymphatic Medical History: Negative for: Anemia; Hemophilia; Human Immunodeficiency Virus; Lymphedema Respiratory Medical History: Negative for: Aspiration; Asthma; Chronic Obstructive Pulmonary Disease (COPD); Pneumothorax; Sleep Apnea; Tuberculosis TRINKA, KESHISHYAN ([id number]) [id number] Page 7 of 8 Cardiovascular Medical History: Positive for: Arrhythmia - sinus tachycardia; Hypertension Negative for: Hypotension; Myocardial Infarction; Peripheral Arterial Disease; Peripheral Venous Disease; Phlebitis; Vasculitis Gastrointestinal Medical History: Negative for: Cirrhosis ; Colitis; Crohns; Hepatitis A; Hepatitis B; Hepatitis C Genitourinary Medical History: Negative for: End Stage Renal Disease Immunological Medical History: Negative for: Lupus Erythematosus; Raynauds; Scleroderma Integumentary (Skin) Medical History: Negative for: History of Burn Musculoskeletal Medical History: Positive for: Osteoarthritis - bilateral knees Negative for: Gout; Rheumatoid Arthritis; Osteomyelitis Neurologic Medical History: Negative for: Dementia; Neuropathy; Quadriplegia; Paraplegia; Seizure Disorder Psychiatric Medical History: Negative for: Anorexia/bulimia; Confinement Anxiety HBO Extended History Items Eyes: Cataracts Immunizations Pneumococcal Vaccine: Received Pneumococcal Vaccination: Yes Received Pneumococcal Vaccination On or After 60th Birthday: Yes Implantable Devices None Family and Social History Cancer: No; Diabetes: Yes - Maternal Grandparents,Mother; Heart Disease: Yes - Maternal Grandparents,Mother; Hereditary Spherocytosis: No; Hypertension: Yes - Maternal Grandparents,Mother; Kidney Disease: No; Lung Disease: Yes - Father; Seizures: No; Stroke: Yes -  Mother,Maternal  Grandparents; Thyroid Problems: No; Tuberculosis: No; Never smoker; Marital Status - Widowed; Alcohol Use: Never; Drug Use: No History; Caffeine Use: Daily; Financial Concerns: No; Food, Clothing or Shelter Needs: No; Support System Lacking: No; Transportation Concerns: No Electronic Signature(s) Signed: 08/03/2022 3:21:47 PM By: Duanne Guess MD FACS Entered By: Duanne Guess on 08/03/2022 15:06:00 -------------------------------------------------------------------------------- SuperBill Details Patient Name: Date of Service: [facility] DES, DA [name] 08/03/2022 Medical Record Number: [id number] Patient Account Number: [id number] Date of Birth/Sex: Treating RN: 1955-05-27 (67 y.o. [name] Primary Care Provider: Docia Chuck, [name] Other Clinician: Referring Provider: Treating Provider/Extender: Leroy Libman, [name] Weeks in Treatment: [address] Earna Coder ([id number]) [id number] Page 8 of 8 Diagnosis Coding ICD-10 Codes Code Description L97.422 Non-pressure chronic ulcer of left heel and midfoot with fat layer exposed T81.31XS Disruption of external operation (surgical) wound, not elsewhere classified, sequela I10 Essential (primary) hypertension Facility Procedures : CPT4 Code: [id number] Description: 97597 - DEBRIDE WOUND 1ST 20 SQ CM OR < ICD-10 Diagnosis Description L97.422 Non-pressure chronic ulcer of left heel and midfoot with fat layer exposed Modifier: Quantity: 1 Physician Procedures : CPT4 Code Description Modifier [id number] - WC PHYS LEVEL 3 - EST PT 25 ICD-10 Diagnosis Description L97.422 Non-pressure chronic ulcer of left heel and midfoot with fat layer exposed T81.31XS Disruption of external operation (surgical) wound, not  elsewhere classified, sequela I10 Essential (primary) hypertension Quantity: 1 : [id number] 97597 - WC PHYS DEBR WO ANESTH 20 SQ CM ICD-10 Diagnosis Description L97.422 Non-pressure chronic  ulcer of left heel and midfoot with fat layer exposed Quantity: 1 Electronic Signature(s) Signed: 08/03/2022 3:11:28 PM By: Duanne Guess MD FACS Entered By: Duanne Guess on 08/03/2022 15:11:28

## 2022-08-11 ENCOUNTER — Other Ambulatory Visit: Payer: Self-pay | Admitting: Interventional Radiology

## 2022-08-11 ENCOUNTER — Ambulatory Visit
Admission: RE | Admit: 2022-08-11 | Discharge: 2022-08-11 | Disposition: A | Discharge status: Home / Self Care | Payer: Medicare PPO | Source: Ambulatory Visit | Attending: Podiatry | Admitting: Podiatry

## 2022-08-11 DIAGNOSIS — S91302A Unspecified open wound, left foot, initial encounter: Secondary | ICD-10-CM

## 2022-08-11 HISTORY — PX: IR RADIOLOGIST EVAL & MGMT: IMG5224

## 2022-08-11 NOTE — Progress Notes (Addendum)
ARLIE, POSCH ([id number]) [id number] Page 1 of 7 Visit Report for 08/10/2022 Arrival Information Details Patient Name: Date of Service: [facility] DES, [place] [name] K. 08/10/2022 2:00 PM Medical Record Number: [id number] Patient Account Number: [id number] Date of Birth/Sex: Treating RN: [birth year] (67 y.o. Orville Govern Primary Care Beonka Amesquita: Docia Chuck, [name] Other Clinician: Referring Hezakiah Champeau: Treating Tiyona Desouza/Extender: Leroy Libman, [name] Weeks in Treatment: 8 Visit Information History Since Last Visit Added or deleted any medications: No Patient Arrived: Ambulatory Any new allergies or adverse reactions: No Arrival Time: 14:03 Had a fall or experienced change in No Accompanied By: self activities of daily living that may affect Transfer Assistance: None risk of falls: Patient Identification Verified: Yes Signs or symptoms of abuse/neglect since last visito No Secondary Verification Process Completed: Yes Hospitalized since last visit: No Patient Requires Transmission-Based Precautions: No Implantable device outside of the clinic excluding No Patient Has Alerts: Yes cellular tissue based products placed in the center Patient Alerts: ABI LLE 1.11 since last visit: ABI RLE 1.04 Has Dressing in Place as Prescribed: Yes Pain Present Now: Yes Electronic Signature(s) Signed: 08/10/2022 3:58:17 PM By: Redmond Pulling RN, BSN Entered By: Redmond Pulling on 08/10/2022 14:06:28 -------------------------------------------------------------------------------- Encounter Discharge Information Details Patient Name: Date of Service: [facility] DES, [place] [name] K. 08/10/2022 2:00 PM Medical Record Number: [id number] Patient Account Number: [id number] Date of Birth/Sex: Treating RN: 06-11-1955 (67 y.o. Orville Govern Primary Care Colinda Barth: Docia Chuck, [name] Other Clinician: Referring Lawrencia Mauney: Treating Nyles Mitton/Extender: Leroy Libman, [name] Weeks in  Treatment: 8 Encounter Discharge Information Items Post Procedure Vitals Discharge Condition: Stable Temperature (F): 97.9 Ambulatory Status: Ambulatory Pulse (bpm): 76 Discharge Destination: Home Respiratory Rate (breaths/min): 18 Transportation: Private Auto Blood Pressure (mmHg): 142/84 Accompanied By: self Schedule Follow-up Appointment: Yes Clinical Summary of Care: Patient Declined Electronic Signature(s) Signed: 08/10/2022 3:58:17 PM By: Redmond Pulling RN, BSN Entered By: Redmond Pulling on 08/10/2022 15:39:12 -------------------------------------------------------------------------------- Lower Extremity Assessment Details Patient Name: Date of Service: [facility] DES, [place] [name] K. 08/10/2022 2:00 PM Medical Record Number: [id number] Patient Account Number: [id number] Date of Birth/Sex: Treating RN: 1956/03/16 (67 y.o. Orville Govern Primary Care Nissim Fleischer: Docia Chuck, [name] Other Clinician: Referring Ayssa Bentivegna: Treating Margaux Engen/Extender: Leroy Libman, [name] Weeks in Treatment: 8 Edema Assessment Assessed: [Left: No] [Right: No] R[LeftKORALEE, WEDEKING ([id number])] [Right: [id number] Page 2 of 7] [Left: Edema] [Right: :] Calf Left: Right: Point of Measurement: From Medial Instep 42 cm Ankle Left: Right: Point of Measurement: From Medial Instep 25 cm Vascular Assessment Pulses: Dorsalis Pedis Palpable: [Left:Yes] Electronic Signature(s) Signed: 08/10/2022 3:58:17 PM By: Redmond Pulling RN, BSN Entered By: Redmond Pulling on 08/10/2022 14:07:41 -------------------------------------------------------------------------------- Multi Wound Chart Details Patient Name: Date of Service: [facility] DES, [place] [name] K. 08/10/2022 2:00 PM Medical Record Number: [id number] Patient Account Number: [id number] Date of Birth/Sex: Treating RN: 23-Dec-1955 (67 y.o. F) Primary Care Vanilla Heatherington: Docia Chuck, [name] Other Clinician: Referring Marquese Burkland: Treating  Alyce Inscore/Extender: Leroy Libman, [name] Weeks in Treatment: 8 Vital Signs Height(in): 64 Pulse(bpm): 76 Weight(lbs): 210 Blood Pressure(mmHg): 142/84 Body Mass Index(BMI): 36 Temperature(F): 97.9 Respiratory Rate(breaths/min): 18 [1:Photos:] [N/A:N/A] Left Achilles N/A N/A Wound Location: Surgical Injury N/A N/A Wounding Event: Dehisced Wound N/A N/A Primary Etiology: Cataracts, Arrhythmia, Hypertension, N/A N/A Comorbid History: Osteoarthritis 12/26/2021 N/A N/A Date Acquired: 8 N/A N/A Weeks of Treatment: Open N/A N/A Wound Status: No N/A N/A Wound Recurrence: 0.4x0.3x0.1 N/A N/A Measurements L x W x D (cm) 0.094 N/A N/A A (cm) : rea 0.009 N/A N/A Volume (cm) : -32.40% N/A N/A %  Reduction in Area: -28.60% N/A N/A % Reduction in Volume: Full Thickness Without Exposed N/A N/A Classification: Support Structures Medium N/A N/A Exudate A mount: Serous N/A N/A Exudate Type: amber N/A N/A Exudate Color: Large (67-100%) N/A N/A Granulation Amount: Pink N/A N/A Granulation Quality: Small (1-33%) N/A N/A Necrotic Amount: Eschar, Adherent Slough N/A N/A Necrotic Tissue: Fat Layer (Subcutaneous Tissue): Yes N/A N/A Exposed Structures: Fascia: No TESSIE, ORDAZ ([id number]) [id number] Page 3 of 7 Tendon: No Muscle: No Joint: No Bone: No Small (1-33%) N/A N/A Epithelialization: Debridement - Excisional N/A N/A Debridement: Pre-procedure Verification/Time Out 14:20 N/A N/A Taken: Lidocaine 4% Topical Solution N/A N/A Pain Control: Subcutaneous, Slough N/A N/A Tissue Debrided: Skin/Subcutaneous Tissue N/A N/A Level: 0.12 N/A N/A Debridement A (sq cm): rea Curette N/A N/A Instrument: Minimum N/A N/A Bleeding: Pressure N/A N/A Hemostasis A chieved: 0 N/A N/A Procedural Pain: 0 N/A N/A Post Procedural Pain: Procedure was tolerated well N/A N/A Debridement Treatment Response: 0.4x0.3x0.1 N/A N/A Post  Debridement Measurements L x W x D (cm) 0.009 N/A N/A Post Debridement Volume: (cm) Scarring: Yes N/A N/A Periwound Skin Texture: Dry/Scaly: Yes N/A N/A Periwound Skin Moisture: Maceration: No Rubor: Yes N/A N/A Periwound Skin Color: No Abnormality N/A N/A Temperature: Yes N/A N/A Tenderness on Palpation: Debridement N/A N/A Procedures Performed: Treatment Notes Wound #1 (Achilles) Wound Laterality: Left Cleanser Soap and Water Discharge Instruction: May shower and wash wound with dial antibacterial soap and water prior to dressing change. Byram Ancillary Kit - 15 Day Supply Discharge Instruction: Use supplies as instructed; Kit contains: (15) Saline Bullets; (15) 3x3 Gauze; 15 pr Gloves Peri-Wound Care Sween Lotion (Moisturizing lotion) Discharge Instruction: Apply moisturizing lotion as directed Topical Mupirocin Ointment Discharge Instruction: Apply Mupirocin (Bactroban) as instructed Primary Dressing Endoform 2x2 in Discharge Instruction: Moisten with saline Secondary Dressing Zetuvit Plus Silicone Border Dressing 4x4 (in/in) Discharge Instruction: Apply silicone border over primary dressing as directed. Secured With Compression Wrap Compression Stockings Facilities manager) Signed: 08/11/2022 7:53:33 AM By: Duanne Guess MD FACS Entered By: Duanne Guess on 08/11/2022 07:53:33 -------------------------------------------------------------------------------- Multi-Disciplinary Care Plan Details Patient Name: Date of Service: [facility] DES, [place] [name] K. 08/10/2022 2:00 PM Medical Record Number: [id number] Patient Account Number: [id number] Date of Birth/Sex: Treating RN: 08-21-1955 (67 y.o. Orville Govern Primary Care Lord Lancour: [place], [place] Other Clinician: Referring Elfriede Bonini: Treating Cidney Kirkwood/Extender: Leroy Libman, [name] Miyazaki, Earna Coder ([id number]) [id number] Page 4 of 7 Weeks in Treatment: 8 Active  Inactive Wound/Skin Impairment Nursing Diagnoses: Impaired tissue integrity Goals: Ulcer/skin breakdown will have a volume reduction of 30% by week 4 Date Initiated: 06/23/2022 Target Resolution Date: 08/22/2022 Goal Status: Active Interventions: Assess ulceration(s) every visit Provide education on ulcer and skin care Treatment Activities: Skin care regimen initiated : 06/23/2022 Notes: Electronic Signature(s) Signed: 08/10/2022 3:58:17 PM By: Redmond Pulling RN, BSN Entered By: Redmond Pulling on 08/10/2022 14:15:35 -------------------------------------------------------------------------------- Pain Assessment Details Patient Name: Date of Service: [facility] DES, [place] [name] K. 08/10/2022 2:00 PM Medical Record Number: [id number] Patient Account Number: [id number] Date of Birth/Sex: Treating RN: 1956-02-02 (67 y.o. Orville Govern Primary Care Edon Hoadley: Docia Chuck, [name] Other Clinician: Referring Sacha Radloff: Treating Shamekia Tippets/Extender: Leroy Libman, [name] Weeks in Treatment: 8 Active Problems Location of Pain Severity and Description of Pain Patient Has Paino Yes Site Locations Rate the pain. Current Pain Level: 2 Pain Management and Medication Current Pain Management: Electronic Signature(s) Signed: 08/10/2022 3:58:17 PM By: Redmond Pulling RN, BSN Entered By: Redmond Pulling on 08/10/2022 14:06:39 Nay, Earna Coder ([id number]) [id number]  Page 5 of 7 -------------------------------------------------------------------------------- Patient/Caregiver Education Details Patient Name: Date of Service: [facility] DES, [place] 4/18/2024andnbsp2:00 PM Medical Record Number: [id number] Patient Account Number: [id number] Date of Birth/Gender: Treating RN: [birth year] (67 y.o. Orville Govern Primary Care Physician: Docia Chuck, [place] Other Clinician: Referring Physician: Treating Physician/Extender: Leroy Libman, [name] Weeks in Treatment: 8 Education  Assessment Education Provided To: Patient Education Topics Provided Wound/Skin Impairment: Methods: Explain/Verbal Responses: State content correctly Electronic Signature(s) Signed: 08/10/2022 3:58:17 PM By: Redmond Pulling RN, BSN Entered By: Redmond Pulling on 08/10/2022 14:16:02 -------------------------------------------------------------------------------- Wound Assessment Details Patient Name: Date of Service: [facility] DES, [place] [name] K. 08/10/2022 2:00 PM Medical Record Number: [id number] Patient Account Number: [id number] Date of Birth/Sex: Treating RN: 12/16/1955 (67 y.o. Orville Govern Primary Care Kassidy Dockendorf: Docia Chuck, [name] Other Clinician: Referring Zion Ta: Treating Nicholous Girgenti/Extender: Leroy Libman, [name] Weeks in Treatment: 8 Wound Status Wound Number: 1 Primary Etiology: Dehisced Wound Wound Location: Left Achilles Wound Status: Open Wounding Event: Surgical Injury Comorbid History: Cataracts, Arrhythmia, Hypertension, Osteoarthritis Date Acquired: 12/26/2021 Weeks Of Treatment: 8 Clustered Wound: No Photos Wound Measurements Length: (cm) 0.4 Width: (cm) 0.3 Depth: (cm) 0.1 Area: (cm) 0.094 Volume: (cm) 0.009 % Reduction in Area: -32.4% % Reduction in Volume: -28.6% Epithelialization: Small (1-33%) Tunneling: No Undermining: No Wound Description Classification: Full Thickness Without Exposed Support Structures Exudate Amount: Medium Exudate Type: Serous Exudate Color: amber LYNEA, ROLLISON ([id number]) Foul Odor After Cleansing: No Slough/Fibrino Yes [id number] Page 6 of 7 Wound Bed Granulation Amount: Large (67-100%) Exposed Structure Granulation Quality: Pink Fascia Exposed: No Necrotic Amount: Small (1-33%) Fat Layer (Subcutaneous Tissue) Exposed: Yes Necrotic Quality: Eschar, Adherent Slough Tendon Exposed: No Muscle Exposed: No Joint Exposed: No Bone Exposed: No Periwound Skin Texture Texture Color No  Abnormalities Noted: No No Abnormalities Noted: No Scarring: Yes Rubor: Yes Moisture Temperature / Pain No Abnormalities Noted: No Temperature: No Abnormality Dry / Scaly: Yes Tenderness on Palpation: Yes Maceration: No Treatment Notes Wound #1 (Achilles) Wound Laterality: Left Cleanser Soap and Water Discharge Instruction: May shower and wash wound with dial antibacterial soap and water prior to dressing change. Byram Ancillary Kit - 15 Day Supply Discharge Instruction: Use supplies as instructed; Kit contains: (15) Saline Bullets; (15) 3x3 Gauze; 15 pr Gloves Peri-Wound Care Sween Lotion (Moisturizing lotion) Discharge Instruction: Apply moisturizing lotion as directed Topical Mupirocin Ointment Discharge Instruction: Apply Mupirocin (Bactroban) as instructed Primary Dressing Endoform 2x2 in Discharge Instruction: Moisten with saline Secondary Dressing Zetuvit Plus Silicone Border Dressing 4x4 (in/in) Discharge Instruction: Apply silicone border over primary dressing as directed. Secured With Compression Wrap Compression Stockings Facilities manager) Signed: 08/10/2022 3:58:17 PM By: Redmond Pulling RN, BSN Entered By: Redmond Pulling on 08/10/2022 14:15:24 -------------------------------------------------------------------------------- Vitals Details Patient Name: Date of Service: [facility] DES, [place] [name] K. 08/10/2022 2:00 PM Medical Record Number: [id number] Patient Account Number: [id number] Date of Birth/Sex: Treating RN: 05-18-1955 (67 y.o. Orville Govern Primary Care Kevyn Wengert: Docia Chuck, [name] Other Clinician: Referring Jerame Hedding: Treating Kiaja Shorty/Extender: Leroy Libman, [name] Weeks in Treatment: 8 Vital Signs Time Taken: 14:05 Temperature (F): 97.9 Height (in): 64 Pulse (bpm): 76 Weight (lbs): 210 Respiratory Rate (breaths/min): 18 Body Mass Index (BMI): 36 Blood Pressure (mmHg): 142/84 SOLITA, MACADAM K ([id number])  [id number] Page 7 of 7 Reference Range: 80 - 120 mg / dl Electronic Signature(s) Signed: 08/10/2022 3:58:17 PM By: Redmond Pulling RN, BSN Entered By: Redmond Pulling on 08/10/2022 14:06:07

## 2022-08-11 NOTE — Progress Notes (Signed)
Chief Complaint: Left heel wound   Referring Physician(s): Patel,Kevin P Wound care: Dr. Lady Gary Cardiology: Dr. Charlton Haws   PCP: Darrow Bussing, MD [facility]   History of Present Illness: [name] is a 67 y.o. female presenting today as a scheduled follow up appointment to VIR clinic.   She has a left foot wound that is slow to heal after a achilles surgery. ,  She is here today by herself for the follow up .   History:   She had surgery August 21 on her left achilles, and that she was then treated for an infection.  Since then she has had some difficulty healing.    Noninvsasive 12/27/21 Right ABI: 1.04 Left ABI: 1.11 Multiphasic waveforms on the segmental at the ankle. No prior cross-sectional imaging  We met her 02/10/22, and had plans for CTA runoff.  This never happened for some reason.   Interval Hx: She tells me today that her wound remains unhealed.  She continues to see wound care and Dr. Lady Gary, as well as Dr. Allena Katz.   I cannot elicit any history of claudication or resting pain.  She does complain of pain at the achilles region with dorsiflexion of the foot, but not with plantar flexion or toe-raises.   She denies any prior MI or stroke.  She continues to see Dr. Eden Emms.         Past Medical History:  Diagnosis Date   Asthma    Asthma    Depression    Depression    Depression    Erosive esophagitis    Headaches, cluster    Hepatitis C    HTN (hypertension)    HTN (hypertension)    HTN (hypertension)    Hypokalemia 05/29/2020    Past Surgical History:  Procedure Laterality Date   IR RADIOLOGIST EVAL & MGMT  02/10/2022   KNEE SURGERY  04/2020    Allergies: Atenolol, Lisinopril, Paxil [paroxetine hcl], Zoloft [sertraline hcl], and Elemental sulfur  Medications: Prior to Admission medications   Medication Sig Start Date End Date Taking? Authorizing Provider  amLODipine (NORVASC) 5 MG tablet Take 1 tablet (5 mg total) by  mouth daily. 12/30/21   Burnadette Pop, MD  amoxicillin-clavulanate (AUGMENTIN) 875-125 MG tablet Take 1 tablet by mouth 2 (two) times daily. 04/04/22   McDonald, Rachelle Hora, DPM  amoxicillin-clavulanate (AUGMENTIN) 875-125 MG tablet Take 1 tablet by mouth 2 (two) times daily. 04/11/22   Candelaria Stagers, DPM  Calcium Carb-Cholecalciferol (CALCIUM-VITAMIN D3) 600-400 MG-UNIT TABS Take 1 tablet by mouth daily.    [provider]  collagenase (SANTYL) 250 UNIT/GM ointment Apply 1 Application topically daily. 03/03/22   Candelaria Stagers, DPM  EPINEPHrine 0.3 mg/0.3 mL IJ SOAJ injection Inject 0.3 mg into the muscle as needed for anaphylaxis. 01/07/22   Terald Sleeper, MD  furosemide (LASIX) 20 MG tablet Take one tablet by mouth daily for the next 3 days then take daily as needed thereafter for lower extremity swelling 02/08/22   Sharlene Dory, PA-C  gabapentin (NEURONTIN) 100 MG capsule Take 1 capsule (100 mg total) by mouth 3 (three) times daily. 12/21/21   Candelaria Stagers, DPM  hydrOXYzine (ATARAX) 25 MG tablet Take 1 tablet (25 mg total) by mouth every 6 (six) hours as needed for up to 21 doses for itching. 01/07/22   Terald Sleeper, MD  ibuprofen (ADVIL) 800 MG tablet Take 1 tablet (800 mg total) by mouth every 6 (six) hours  as needed. 12/12/21   Candelaria Stagers, DPM  metoprolol tartrate (LOPRESSOR) 50 MG tablet TAKE 1 AND 1/2 TABLET BY MOUTH TWO TIMES A DAY 04/05/22   Wendall Stade, MD  metoprolol tartrate 75 MG TABS Take 75 mg by mouth 2 (two) times daily. 12/29/21   Burnadette Pop, MD  Multiple Vitamins-Minerals (MULTIVITAMIN PO) Take 1 tablet by mouth daily.    [provider]  omeprazole (PRILOSEC) 40 MG capsule Take 40 mg by mouth daily. 10/14/15   [provider]  oxyCODONE-acetaminophen (PERCOCET) 10-325 MG tablet Take 1 tablet by mouth every 4 (four) hours as needed for pain. 04/25/22   Candelaria Stagers, DPM  oxyCODONE-acetaminophen (PERCOCET) 10-325 MG tablet Take 1 tablet by  mouth every 4 (four) hours as needed for pain. 05/03/22   Candelaria Stagers, DPM  oxyCODONE-acetaminophen (PERCOCET) 10-325 MG tablet Take 1 tablet by mouth every 4 (four) hours as needed for pain. 05/10/22   Candelaria Stagers, DPM  oxyCODONE-acetaminophen (PERCOCET) 10-325 MG tablet Take 1 tablet by mouth every 4 (four) hours as needed for pain. 06/15/22   Candelaria Stagers, DPM  oxyCODONE-acetaminophen (PERCOCET) 10-325 MG tablet Take 1 tablet by mouth every 4 (four) hours as needed for pain. 07/12/22   Candelaria Stagers, DPM  potassium chloride (KLOR-CON) 10 MEQ tablet Take one tablet by mouth daily for the next 3 days then take daily as needed thereafter for lower extremity swelling 02/08/22   Sharlene Dory, PA-C  valsartan-hydrochlorothiazide (DIOVAN HCT) 160-12.5 MG tablet Take 1 tablet by mouth daily. 06/20/22   Wendall Stade, MD  venlafaxine XR (EFFEXOR-XR) 75 MG 24 hr capsule Take 1 capsule (75 mg total) by mouth daily with breakfast. 12/30/21   Burnadette Pop, MD     Family History  Problem Relation Age of Onset   Emphysema Father    Coronary artery disease Mother        CABG   Diabetes Mellitus II Mother    Heart disease Mother    Stroke Brother    Heart attack Sister    Crohn's disease Sister    Liver disease Sister    Heart disease Brother    Colitis Brother    Heart disease Brother    Irritable bowel syndrome Daughter    Bipolar disorder Daughter    Breast cancer Neg Hx     Social History   Socioeconomic History   Marital status: Widowed    Spouse name: Not on file   Number of children: Not on file   Years of education: Not on file   Highest education level: Not on file  Occupational History   Not on file  Tobacco Use   Smoking status: Never   Smokeless tobacco: Never  Substance and Sexual Activity   Alcohol use: Not Currently    Alcohol/week: 0.0 standard drinks of alcohol   Drug use: Not on file   Sexual activity: Not on file  Other Topics Concern   Not on file   Social History Narrative   Not on file   Social Determinants of Health   Financial Resource Strain: Not on file  Food Insecurity: No Food Insecurity (12/28/2021)   Hunger Vital Sign    Worried About Running Out of Food in the Last Year: Never true    Ran Out of Food in the Last Year: Never true  Transportation Needs: No Transportation Needs (12/28/2021)   PRAPARE - Administrator, Civil Service (Medical): No  Lack of Transportation (Non-Medical): No  Physical Activity: Not on file  Stress: Not on file  Social Connections: Not on file      Review of Systems: A 12 point ROS discussed and pertinent positives are indicated in the HPI above.  All other systems are negative.  Review of Systems  Vital Signs: BP (!) 162/92 (BP Location: Left Arm, Patient Position: Sitting, Cuff Size: Normal)   Pulse 85   Temp 98.2 F (36.8 C) (Oral)   Wt 97.5 kg   SpO2 96% Comment: room air  BMI 36.90 kg/m   Advance Care Plan: The advanced care plan/surrogate decision maker was discussed at the time of visit and documented in the medical record.    Physical Exam General: 67 yo female appearing stated age.  Well-developed, well-nourished.  No distress. HEENT: Atraumatic, normocephalic.  Conjugate gaze, extra-ocular motor intact. No scleral icterus or scleral injection. No lesions on external ears, nose, lips, or gums.  Oral mucosa moist, pink.  Neck: Symmetric with no goiter enlargement.  Chest/Lungs:  Symmetric chest with inspiration/expiration.  No labored breathing.  Clear to auscultation with no wheezes, rhonchi, or rales.  Heart:  RRR, with no third heart sounds appreciated. No JVD appreciated.  Abdomen:  Soft, NT/ND, with + bowel sounds.   Genito-urinary: Deferred Neurologic: Alert & Oriented to person, place, and time.   Normal affect and insight.  Appropriate questions.  Moving all 4 extremities with gross sensory intact.  Pulse Exam:  No bruit appreciated.  Doppler + left and  right PT. Doppler + left and right DP, though monophasic bilateral.  Extremities: Right has no wound.  Incomplete healing of left achilles surgery surgery wound, with no erythema or drainage.   Mallampati Score:     Imaging: No results found.  Labs:  CBC: Recent Labs    12/25/21 1949 12/26/21 0321 12/27/21 0323 12/28/21 0330  WBC 9.3 8.3 6.7 7.4  HGB 13.2 12.6 12.9 13.3  HCT 39.7 38.3 38.7 40.5  PLT 300 287 267 290    COAGS: No results for input(s): "INR", "APTT" in the last 8760 hours.  BMP: Recent Labs    12/26/21 0321 12/27/21 0323 12/28/21 0330 12/29/21 0405 02/08/22 1538 05/24/22 0937 07/04/22 1614  NA 144 142 146* 145 141 144 144  K 3.0* 2.9* 3.1* 3.3* 4.4 4.4 3.8  CL 106 104 107 105 100 101 100  CO2 GLUCOSE 131* 112* 113* 105* 83 103* 73  BUN CALCIUM 9.2 9.4 9.8 9.6 10.4* 10.1 10.1  CREATININE 0.70 0.60 0.73 0.80 0.77 0.61 0.80  GFRNONAA >60 >60 >60 >60  --   --   --     LIVER FUNCTION TESTS: No results for input(s): "BILITOT", "AST", "ALT", "ALKPHOS", "PROT", "ALBUMIN" in the last 8760 hours.  TUMOR MARKERS: No results for input(s): "AFPTM", "CEA", "CA199", "CHROMGRNA" in the last 8760 hours.  Assessment and Plan:  Assessment:  Ms Mount is a very pleasant 67 yo female presenting with slow healing of a post-surgical wound, left heel.    Previous non-invasive lower extremity exam shows normal ABI.    Again today we had a discussion regarding anatomy, pathology/pathophysiology, natural history, and prognosis of PAD/CLI.  With a normal ABI at this time she does not have the diagnosis.  However, given that she has had a very slow time course of healing, I think it is reasonable to pursue additional test.  This might be invasive with angiogram, or alternatively CTA or MRA.  We discussed the differences with advantages/disadvantages.   We did discuss the fact that atherosclerosis can contribute to poor  healing, but here are other reasons and she does not have multiple risk factors.    She is clearly a conservative woman when it comes to medical procedures.  I think very reasonable to pursue a CTA runoff which we will review before any therapy is considered.   She understands.     Plan: - We will proceed with CTA run-off to evaluate lower extremity.  - We can review the CTA results on the phone to discuss.   - continue current care      Electronically Signed: Gilmer Mor 08/11/2022, 10:36 AM   I spent a total of    25 Minutes in face to face in clinical consultation, greater than 50% of which was counseling/coordinating care for left foot wound, possible PAD contributing, possible angiogram and intervention.

## 2022-08-11 NOTE — Progress Notes (Addendum)
AMEY, HOSSAIN ([id number]) [id number] Page 1 of 8 Visit Report for 08/10/2022 Chief Complaint Document Details Patient Name: Date of Service: [facility] DES, [name]. 08/10/2022 2:00 PM Medical Record Number: [id number] Patient Account Number: [id number] Date of Birth/Sex: Treating RN: 1955-07-14 (66 y.o. F) Primary Care Provider: Docia Chuck, [name] Other Clinician: Referring Provider: Treating Provider/Extender: Leroy Libman, [name] in Treatment: 8 Information Obtained from: Patient Chief Complaint Patient presents to the wound care center with open non-healing surgical wound(s) Electronic Signature(s) Signed: 08/11/2022 7:53:49 AM By: Duanne Guess MD FACS Entered By: Duanne Guess on 08/11/2022 07:53:49 -------------------------------------------------------------------------------- Debridement Details Patient Name: Date of Service: [facility] DES, [name]. 08/10/2022 2:00 PM Medical Record Number: [id number] Patient Account Number: [id number] Date of Birth/Sex: Treating RN: 15-Dec-1955 (67 y.o. Orville Govern Primary Care Provider: Docia Chuck, [name] Other Clinician: Referring Provider: Treating Provider/Extender: Leroy Libman, [name] in Treatment: 8 Debridement Performed for Assessment: Wound #1 Left Achilles Performed By: Physician Duanne Guess, MD Debridement Type: Debridement Level of Consciousness (Pre-procedure): Awake and Alert Pre-procedure Verification/Time Out Yes - 14:20 Taken: Start Time: 14:23 Pain Control: Lidocaine 4% T opical Solution T Area Debrided (L x W): otal 0.4 (cm) x 0.3 (cm) = 0.12 (cm) Tissue and other material debrided: Non-Viable, Slough, Subcutaneous, Skin: Dermis , Skin: Epidermis, Slough Level: Skin/Subcutaneous Tissue Debridement Description: Excisional Instrument: Curette Bleeding: Minimum Hemostasis Achieved: Pressure Procedural Pain: 0 Post Procedural Pain: 0 Response to  Treatment: Procedure was tolerated well Level of Consciousness (Post- Awake and Alert procedure): Post Debridement Measurements of Total Wound Length: (cm) 0.4 Width: (cm) 0.3 Depth: (cm) 0.1 Volume: (cm) 0.009 Character of Wound/Ulcer Post Debridement: Improved Post Procedure Diagnosis Same as Pre-procedure Notes Scribed for Dr Lady Gary by Redmond Pulling, RN Electronic Signature(s) Signed: 08/10/2022 3:58:17 PM By: Redmond Pulling RN, BSN Signed: 08/10/2022 4:05:13 PM By: Duanne Guess MD FACS Entered By: Redmond Pulling on 08/10/2022 14:26:47 [name], [name] ([id number]) [id number] Page 2 of 8 -------------------------------------------------------------------------------- HPI Details Patient Name: Date of Service: [facility] DES, [name]. 08/10/2022 2:00 PM Medical Record Number: [id number] Patient Account Number: [id number] Date of Birth/Sex: Treating RN: 11-03-1955 (67 y.o. F) Primary Care Provider: Docia Chuck, [name] Other Clinician: Referring Provider: Treating Provider/Extender: Leroy Libman, [name] in Treatment: 8 History of Present Illness HPI Description: ADMISSION 06/14/2022 This is a 67 year old otherwise quite healthy woman who underwent repair of the Haglund's deformity and ruptured Achilles tendon back in August 2023. The wound subsequently became infected and dehisced. She ended up hospitalized for the infection and has since been on multiple courses of oral antibiotics along with use of a wound VAC for a time then Betadine wet-to-dry dressing changes. [place] sure that her wound was healed and she would be able to complete her move without difficulty. She has just been using triple antibiotic ointment and a Band-Aid at this point. On the back of her left heel, there is a small circular wound in the otherwise well-healed scar line. There is some dry eschar around the edges.  The wound itself is just a couple of millimeters in diameter. No concern for infection. 06/23/2022: The wound is basically unchanged in size today. There is some eschar and dried collagen accumulation. 06/30/2022: The wound is unchanged in size. There is eschar and slough accumulation. The surface appears quite healthy and I think the wound may be a bit shallower. 07/14/2022: The wound is really unchanged overall. There  is some eschar and slough accumulation. 07/24/2022: Her wound is little bit narrower underneath some eschar. She has a referral in to a wound care center closer to her new home, but is waiting to confirm it is in network for her insurance. 08/03/2022: There is not seem to be much significant change to her wound. There is some slough and eschar that has reaccumulated. She is waiting until May to begin treatment at the wound care center near her new home. 08/10/2022: No significant change to her wound. She has been in communication with Dr. Allena Katz. He is sending her for repeat vascular studies as well as consultation with Dr. Ardelle Anton. Electronic Signature(s) Signed: 08/11/2022 7:54:39 AM By: Duanne Guess MD FACS Entered By: Duanne Guess on 08/11/2022 07:54:38 -------------------------------------------------------------------------------- Physical Exam Details Patient Name: Date of Service: [name], [name]. 08/10/2022 2:00 PM Medical Record Number: [id number] Patient Account Number: [id number] Date of Birth/Sex: Treating RN: [birth year] (67 y.o. F) Primary Care Provider: Docia Chuck, [name] Other Clinician: Referring Provider: Treating Provider/Extender: Leroy Libman, [name] in Treatment: 8 Constitutional Slightly hypertensive. . . . no acute distress. Respiratory Normal work of breathing on room air. Notes 08/10/2022: There has really been no change to the wound. There is some slough and eschar present and no concern for infection, but it is really not  progressing at all. Electronic Signature(s) Signed: 08/11/2022 8:21:06 AM By: Duanne Guess MD FACS Entered By: Duanne Guess on 08/11/2022 08:21:06 Physician Orders Details -------------------------------------------------------------------------------- Mack Hook ([id number]) [id number] Page 3 of 8 Patient Name: Date of Service: [facility] DES, [name]. 08/10/2022 2:00 PM Medical Record Number: [id number] Patient Account Number: [id number] Date of Birth/Sex: Treating RN: June 17, 1955 (67 y.o. Orville Govern Primary Care Provider: Docia Chuck, [name] Other Clinician: Referring Provider: Treating Provider/Extender: Leroy Libman, [name] in Treatment: 8 Verbal / Phone Orders: No Diagnosis Coding ICD-10 Coding Code Description [phone] Non-pressure chronic ulcer of left heel and midfoot with fat layer exposed T81.31XS Disruption of external operation (surgical) wound, not elsewhere classified, sequela I10 Essential (primary) hypertension Follow-up Appointments ppointment in 1 week. - Dr. Lady Gary Room 3 Return A Other: - Pickup mupirocin (topical antibiotic) at pharmacy Anesthetic Wound #1 Left Achilles (In clinic) Topical Lidocaine 4% applied to wound bed Bathing/ Shower/ Hygiene May shower and wash wound with soap and water. - Make sure to dry wound area really well Edema Control - Lymphedema / SCD / Other Left Lower Extremity Elevate legs to the level of the heart or above for 30 minutes daily and/or when sitting for 3-4 times a day throughout the day. Exercise regularly Moisturize legs daily. Wound Treatment Wound #1 - Achilles Wound Laterality: Left Cleanser: Soap and Water 1 x Per Day/30 Days Discharge Instructions: May shower and wash wound with dial antibacterial soap and water prior to dressing change. Cleanser: Byram Ancillary Kit - 15 Day Supply (Generic) 1 x Per Day/30 Days Discharge Instructions: Use supplies as  instructed; Kit contains: (15) Saline Bullets; (15) 3x3 Gauze; 15 pr Gloves Peri-Wound Care: Sween Lotion (Moisturizing lotion) 1 x Per Day/30 Days Discharge Instructions: Apply moisturizing lotion as directed Topical: Mupirocin Ointment 1 x Per Day/30 Days Discharge Instructions: Apply Mupirocin (Bactroban) as instructed Prim Dressing: Endoform 2x2 in (Generic) 1 x Per Day/30 Days ary Discharge Instructions: Moisten with saline Secondary Dressing: Zetuvit Plus Silicone Border Dressing 4x4 (in/in) (Generic) 1 x Per Day/30 Days Discharge Instructions: Apply silicone border over primary dressing as directed. Patient Medications llergies: elemental sulfur, lisinopril, Zoloft, atenolol  A Notifications Medication Indication Start End prior to debridement 08/10/2022 lidocaine DOSE topical 4 % cream - cream topical once daily Electronic Signature(s) Signed: 08/11/2022 12:37:49 PM By: Duanne Guess MD FACS Previous Signature: 08/10/2022 3:58:17 PM Version By: Redmond Pulling RN, BSN Previous Signature: 08/10/2022 4:05:13 PM Version By: Duanne Guess MD FACS Entered By: Duanne Guess on 08/11/2022 08:21:30 Problem List Details -------------------------------------------------------------------------------- Mack Hook ([id number]) [id number] Page 4 of 8 Patient Name: Date of Service: [facility] DES, [name]. 08/10/2022 2:00 PM Medical Record Number: [id number] Patient Account Number: [id number] Date of Birth/Sex: Treating RN: 14-Sep-1955 (67 y.o. F) Primary Care Provider: Docia Chuck, [name] Other Clinician: Referring Provider: Treating Provider/Extender: Leroy Libman, [name] in Treatment: 8 Active Problems ICD-10 Encounter Code Description Active Date MDM Diagnosis L97.422 Non-pressure chronic ulcer of left heel and midfoot with fat layer exposed 06/14/2022 No Yes T81.31XS Disruption of external operation (surgical) wound, not elsewhere  classified, 06/14/2022 No Yes sequela I10 Essential (primary) hypertension 06/14/2022 No Yes Inactive Problems Resolved Problems Electronic Signature(s) Signed: 08/11/2022 7:53:25 AM By: Duanne Guess MD FACS Entered By: Duanne Guess on 08/11/2022 07:53:25 -------------------------------------------------------------------------------- Progress Note Details Patient Name: Date of Service: [name], [name]. 08/10/2022 2:00 PM Medical Record Number: [id number] Patient Account Number: [id number] Date of Birth/Sex: Treating RN: 1955-12-19 (67 y.o. F) Primary Care Provider: Docia Chuck, [name] Other Clinician: Referring Provider: Treating Provider/Extender: Leroy Libman, [name] in Treatment: 8 Subjective Chief Complaint Information obtained from Patient Patient presents to the wound care center with open non-healing surgical wound(s) History of Present Illness (HPI) ADMISSION 06/14/2022 This is a 67 year old otherwise quite healthy woman who underwent repair of the Haglund's deformity and ruptured Achilles tendon back in August 2023. The wound subsequently became infected and dehisced. She ended up hospitalized for the infection and has since been on multiple courses of oral antibiotics along with use of a wound VAC for a time then Betadine wet-to-dry dressing changes. [place] sure that her wound was healed and she would be able to complete her move without difficulty. She has just been using triple antibiotic ointment and a Band-Aid at this point. On the back of her left heel, there is a small circular wound in the otherwise well-healed scar line. There is some dry eschar around the edges. The wound itself is just a couple of millimeters in diameter. No concern for infection. 06/23/2022: The wound is basically unchanged in size today. There is some eschar and dried collagen accumulation. 06/30/2022: The wound is unchanged  in size. There is eschar and slough accumulation. The surface appears quite healthy and I think the wound may be a bit shallower. 07/14/2022: The wound is really unchanged overall. There is some eschar and slough accumulation. 07/24/2022: Her wound is little bit narrower underneath some eschar. She has a referral in to a wound care center closer to her new home, but is waiting to confirm it is in network for her insurance. 08/03/2022: There is not seem to be much significant change to her wound. There is some slough and eschar that has reaccumulated. She is waiting until May to begin treatment at the wound care center near her new home. 08/10/2022: No significant change to her wound. She has been in communication with Dr. Allena Katz. He is sending her for repeat vascular studies as well as consultation with Dr. Ardelle Anton. CHIZARA, [name] ([id number]) [id number] Page 5 of 8 Patient History Family History  Diabetes - Maternal Grandparents,Mother, Heart Disease - Maternal Grandparents,Mother, Hypertension - Maternal Grandparents,Mother, Lung Disease - Father, Stroke - Mother,Maternal Grandparents, No family history of Cancer, Hereditary Spherocytosis, Kidney Disease, Seizures, Thyroid Problems, Tuberculosis. Social History Never smoker, Marital Status - Widowed, Alcohol Use - Never, Drug Use - No History, Caffeine Use - Daily. Medical History Eyes Patient has history of Cataracts - bilateral Denies history of Glaucoma, Optic Neuritis Ear/Nose/Mouth/Throat Denies history of Chronic sinus problems/congestion, Middle ear problems Hematologic/Lymphatic Denies history of Anemia, Hemophilia, Human Immunodeficiency Virus, Lymphedema Respiratory Denies history of Aspiration, Asthma, Chronic Obstructive Pulmonary Disease (COPD), Pneumothorax, Sleep Apnea, Tuberculosis Cardiovascular Patient has history of Arrhythmia - sinus tachycardia, Hypertension Denies history of Hypotension,  Myocardial Infarction, Peripheral Arterial Disease, Peripheral Venous Disease, Phlebitis, Vasculitis Gastrointestinal Denies history of Cirrhosis , Colitis, Crohnoos, Hepatitis A, Hepatitis B, Hepatitis C Genitourinary Denies history of End Stage Renal Disease Immunological Denies history of Lupus Erythematosus, Raynaudoos, Scleroderma Integumentary (Skin) Denies history of History of Burn Musculoskeletal Patient has history of Osteoarthritis - bilateral knees Denies history of Gout, Rheumatoid Arthritis, Osteomyelitis Neurologic Denies history of Dementia, Neuropathy, Quadriplegia, Paraplegia, Seizure Disorder Psychiatric Denies history of Anorexia/bulimia, Confinement Anxiety Objective Constitutional Slightly hypertensive. no acute distress. Vitals Time Taken: 2:05 PM, Height: 64 in, Weight: 210 lbs, BMI: 36, Temperature: 97.9 F, Pulse: 76 bpm, Respiratory Rate: 18 breaths/min, Blood Pressure: 142/84 mmHg. Respiratory Normal work of breathing on room air. General Notes: 08/10/2022: There has really been no change to the wound. There is some slough and eschar present and no concern for infection, but it is really not progressing at all. Integumentary (Hair, Skin) Wound #1 status is Open. Original cause of wound was Surgical Injury. The date acquired was: 12/26/2021. The wound has been in treatment 8 [name]. The wound is located on the Left Achilles. The wound measures 0.4cm length x 0.3cm width x 0.1cm depth; 0.094cm^2 area and 0.009cm^3 volume. There is Fat Layer (Subcutaneous Tissue) exposed. There is no tunneling or undermining noted. There is a medium amount of serous drainage noted. There is large (67-100%) pink granulation within the wound bed. There is a small (1-33%) amount of necrotic tissue within the wound bed including Eschar and Adherent Slough. The periwound skin appearance exhibited: Scarring, Dry/Scaly, Rubor. The periwound skin appearance did not exhibit: Maceration.  Periwound temperature was noted as No Abnormality. The periwound has tenderness on palpation. Assessment Active Problems ICD-10 Non-pressure chronic ulcer of left heel and midfoot with fat layer exposed Disruption of external operation (surgical) wound, not elsewhere classified, sequela Essential (primary) hypertension [facility], [name] ([id number]) [id number] Page 6 of 8 Procedures Wound #1 Pre-procedure diagnosis of Wound #1 is a Dehisced Wound located on the Left Achilles . There was a Excisional Skin/Subcutaneous Tissue Debridement with a total area of 0.12 sq cm performed by Duanne Guess, MD. With the following instrument(s): Curette to remove Non-Viable tissue/material. Material removed includes Subcutaneous Tissue, Slough, Skin: Dermis, and Skin: Epidermis after achieving pain control using Lidocaine 4% T opical Solution. No specimens were taken. A time out was conducted at 14:20, prior to the start of the procedure. A Minimum amount of bleeding was controlled with Pressure. The procedure was tolerated well with a pain level of 0 throughout and a pain level of 0 following the procedure. Post Debridement Measurements: 0.4cm length x 0.3cm width x 0.1cm depth; 0.009cm^3 volume. Character of Wound/Ulcer Post Debridement is improved. Post procedure Diagnosis Wound #1: Same as Pre-Procedure General Notes: Scribed for Dr Lady Gary by Redmond Pulling,  RN. Plan Follow-up Appointments: Return Appointment in 1 week. - Dr. Lady Gary Room 3 Other: - Pickup mupirocin (topical antibiotic) at pharmacy Anesthetic: Wound #1 Left Achilles: (In clinic) Topical Lidocaine 4% applied to wound bed Bathing/ Shower/ Hygiene: May shower and wash wound with soap and water. - Make sure to dry wound area really well Edema Control - Lymphedema / SCD / Other: Elevate legs to the level of the heart or above for 30 minutes daily and/or when sitting for 3-4 times a day throughout the  day. Exercise regularly Moisturize legs daily. The following medication(s) was prescribed: lidocaine topical 4 % cream cream topical once daily for prior to debridement was prescribed at facility WOUND #1: - Achilles Wound Laterality: Left Cleanser: Soap and Water 1 x Per Day/30 Days Discharge Instructions: May shower and wash wound with dial antibacterial soap and water prior to dressing change. Cleanser: Byram Ancillary Kit - 15 Day Supply (Generic) 1 x Per Day/30 Days Discharge Instructions: Use supplies as instructed; Kit contains: (15) Saline Bullets; (15) 3x3 Gauze; 15 pr Gloves Peri-Wound Care: Sween Lotion (Moisturizing lotion) 1 x Per Day/30 Days Discharge Instructions: Apply moisturizing lotion as directed Topical: Mupirocin Ointment 1 x Per Day/30 Days Discharge Instructions: Apply Mupirocin (Bactroban) as instructed Prim Dressing: Endoform 2x2 in (Generic) 1 x Per Day/30 Days ary Discharge Instructions: Moisten with saline Secondary Dressing: Zetuvit Plus Silicone Border Dressing 4x4 (in/in) (Generic) 1 x Per Day/30 Days Discharge Instructions: Apply silicone border over primary dressing as directed. 08/10/2022: There has really been no change to the wound. There is some slough and eschar present and no concern for infection, but it is really not progressing at all. I used a curette to debride slough and eschar from the wound. I then debrided a little bit deeper, into the subcutaneous tissue to try once again to stimulate the healing cascade. We will continue topical mupirocin with endoform. Although I think it is unlikely that she will be approved, we are going to submit an IVR for a couple of skin substitutes. She is going tomorrow to have vascular studies repeated to see if there are any potential sites for intervention to improve blood flow to the site. She may ultimately require surgical revision of the scar. Follow-up here in 1 week. Electronic Signature(s) Signed: 08/11/2022  8:23:02 AM By: Duanne Guess MD FACS Entered By: Duanne Guess on 08/11/2022 08:23:02 -------------------------------------------------------------------------------- HxROS Details Patient Name: Date of Service: [name], [name]. 08/10/2022 2:00 PM Medical Record Number: [id number] Patient Account Number: [id number] Date of Birth/Sex: Treating RN: 27-Dec-1955 (67 y.o. F) Primary Care Provider: Docia Chuck, [name] Other Clinician: Referring Provider: Treating Provider/Extender: Leroy Libman, [name] in Treatment: 8 Eyes Medical History: Positive for: Cataracts - bilateral Negative for: Glaucoma; Optic Neuritis [name], [name] ([id number]) [id number] Page 7 of 8 Ear/Nose/Mouth/Throat Medical History: Negative for: Chronic sinus problems/congestion; Middle ear problems Hematologic/Lymphatic Medical History: Negative for: Anemia; Hemophilia; Human Immunodeficiency Virus; Lymphedema Respiratory Medical History: Negative for: Aspiration; Asthma; Chronic Obstructive Pulmonary Disease (COPD); Pneumothorax; Sleep Apnea; Tuberculosis Cardiovascular Medical History: Positive for: Arrhythmia - sinus tachycardia; Hypertension Negative for: Hypotension; Myocardial Infarction; Peripheral Arterial Disease; Peripheral Venous Disease; Phlebitis; Vasculitis Gastrointestinal Medical History: Negative for: Cirrhosis ; Colitis; Crohns; Hepatitis A; Hepatitis B; Hepatitis C Genitourinary Medical History: Negative for: End Stage Renal Disease Immunological Medical History: Negative for: Lupus Erythematosus; Raynauds; Scleroderma Integumentary (Skin) Medical History: Negative for: History of Burn Musculoskeletal Medical History: Positive for: Osteoarthritis - bilateral knees Negative for: Gout; Rheumatoid Arthritis;  Osteomyelitis Neurologic Medical History: Negative for: Dementia; Neuropathy; Quadriplegia; Paraplegia; Seizure  Disorder Psychiatric Medical History: Negative for: Anorexia/bulimia; Confinement Anxiety HBO Extended History Items Eyes: Cataracts Immunizations Pneumococcal Vaccine: Received Pneumococcal Vaccination: Yes Received Pneumococcal Vaccination On or After 60th Birthday: Yes Implantable Devices None Family and Social History Cancer: No; Diabetes: Yes - Maternal Grandparents,Mother; Heart Disease: Yes - Maternal Grandparents,Mother; Hereditary Spherocytosis: No; Hypertension: Yes - Maternal Grandparents,Mother; Kidney Disease: No; Lung Disease: Yes - Father; Seizures: No; Stroke: Yes - Mother,Maternal Grandparents; Thyroid Problems: No; Tuberculosis: No; Never smoker; Marital Status - Widowed; Alcohol Use: Never; Drug Use: No History; Caffeine Use: Daily; Financial Concerns: No; Food, Clothing or Shelter Needs: No; Support System Lacking: No; Transportation Concerns: No Electronic Signature(s) [name], [name] ([id number]) [id number] Page 8 of 8 Signed: 08/11/2022 12:37:49 PM By: Duanne Guess MD FACS Entered By: Duanne Guess on 08/11/2022 07:54:48 -------------------------------------------------------------------------------- SuperBill Details Patient Name: Date of Service: [facility] DES, [name]. 08/10/2022 Medical Record Number: [id number] Patient Account Number: [id number] Date of Birth/Sex: Treating RN: 10-18-1955 (67 y.o. F) Primary Care Provider: Docia Chuck, [name] Other Clinician: Referring Provider: Treating Provider/Extender: Leroy Libman, [name] in Treatment: 8 Diagnosis Coding ICD-10 Codes Code Description [id number] Non-pressure chronic ulcer of left heel and midfoot with fat layer exposed T81.31XS Disruption of external operation (surgical) wound, not elsewhere classified, sequela I10 Essential (primary) hypertension Facility Procedures : CPT4 Code: [id number] Description: 11042 - DEB SUBQ TISSUE 20 SQ CM/< ICD-10 Diagnosis  Description L97.422 Non-pressure chronic ulcer of left heel and midfoot with fat layer exposed Modifier: Quantity: 1 Physician Procedures : CPT4 Code Description Modifier [id number] - WC PHYS LEVEL 4 - EST PT 25 ICD-10 Diagnosis Description L97.422 Non-pressure chronic ulcer of left heel and midfoot with fat layer exposed T81.31XS Disruption of external operation (surgical) wound, not  elsewhere classified, sequela I10 Essential (primary) hypertension Quantity: 1 : [id number] 11042 - WC PHYS SUBQ TISS 20 SQ CM ICD-10 Diagnosis Description L97.422 Non-pressure chronic ulcer of left heel and midfoot with fat layer exposed Quantity: 1 Electronic Signature(s) Signed: 08/11/2022 8:23:17 AM By: Duanne Guess MD FACS Entered By: Duanne Guess on 08/11/2022 08:23:17

## 2022-08-15 ENCOUNTER — Encounter: Payer: Self-pay | Admitting: Podiatry

## 2022-08-17 ENCOUNTER — Encounter (HOSPITAL_BASED_OUTPATIENT_CLINIC_OR_DEPARTMENT_OTHER): Payer: Medicare PPO | Admitting: General Surgery

## 2022-08-17 DIAGNOSIS — L97422 Non-pressure chronic ulcer of left heel and midfoot with fat layer exposed: Secondary | ICD-10-CM | POA: Diagnosis not present

## 2022-08-18 NOTE — Progress Notes (Signed)
[name], [name] ([id number]) [id number] Page 1 of 8 Visit Report for 08/17/2022 Chief Complaint Document Details Patient Name: Date of Service: [facility] DES, [place] 08/17/2022 2:45 PM Medical Record Number: [id number] Patient Account Number: [id number] Date of Birth/Sex: Treating RN: 02-23-1956 (67 y.o. F) Primary Care Provider: Docia Chuck, [name] Other Clinician: Referring Provider: Treating Provider/Extender: Leroy Libman, [name] Weeks in Treatment: 9 Information Obtained from: Patient Chief Complaint Patient presents to the wound care center with open non-healing surgical wound(s) Electronic Signature(s) Signed: 08/17/2022 3:52:53 PM By: Duanne Guess MD FACS Entered By: Duanne Guess on 08/17/2022 15:52:53 -------------------------------------------------------------------------------- Debridement Details Patient Name: Date of Service: [facility] DES, [place] [name] 08/17/2022 2:45 PM Medical Record Number: [id number] Patient Account Number: [id number] Date of Birth/Sex: Treating RN: 1956/01/05 (67 y.o. [name] Primary Care Provider: Docia Chuck, [name] Other Clinician: Referring Provider: Treating Provider/Extender: Leroy Libman, [name] Weeks in Treatment: 9 Debridement Performed for Assessment: Wound #1 Left Achilles Performed By: Physician Duanne Guess, MD Debridement Type: Debridement Level of Consciousness (Pre-procedure): Awake and Alert Pre-procedure Verification/Time Out Yes - 15:00 Taken: Start Time: 15:05 Pain Control: Lidocaine 5% topical ointment Percent of Wound Bed Debrided: 100% T Area Debrided (cm): otal 0.05 Tissue and other material debrided: Non-Viable, Slough, Slough Level: Non-Viable Tissue Debridement Description: Selective/Open Wound Instrument: Curette Bleeding: Minimum Hemostasis Achieved: Pressure Procedural Pain: 0 Post Procedural Pain: 0 Response to Treatment: Procedure was tolerated  well Level of Consciousness (Post- Awake and Alert procedure): Post Debridement Measurements of Total Wound Length: (cm) 0.3 Width: (cm) 0.2 Depth: (cm) 0.1 Volume: (cm) 0.005 Character of Wound/Ulcer Post Debridement: Improved Post Procedure Diagnosis Same as Pre-procedure Notes Scribed for Dr Lady Gary by Redmond Pulling, RN Electronic Signature(s) Signed: 08/17/2022 3:29:20 PM By: Redmond Pulling RN, BSN Signed: 08/17/2022 4:24:20 PM By: Duanne Guess MD FACS Lawn, Earna Coder ([id number]) [phone].pdf Page 2 of 8 Entered By: Redmond Pulling on 08/17/2022 15:06:43 -------------------------------------------------------------------------------- HPI Details Patient Name: Date of Service: [facility] DES, DA [name] 08/17/2022 2:45 PM Medical Record Number: [id number] Patient Account Number: [id number] Date of Birth/Sex: Treating RN: Jan 04, 1956 (67 y.o. F) Primary Care Provider: Docia Chuck, [name] Other Clinician: Referring Provider: Treating Provider/Extender: Leroy Libman, [name] Weeks in Treatment: 9 History of Present Illness HPI Description: ADMISSION 06/14/2022 This is a 67 year old otherwise quite healthy woman who underwent repair of the Haglund's deformity and ruptured Achilles tendon back in August 2023. The wound subsequently became infected and dehisced. She ended up hospitalized for the infection and has since been on multiple courses of oral antibiotics along with use of a wound VAC for a time then Betadine wet-to-dry dressing changes. [place] sure that her wound was healed and she would be able to complete her move without difficulty. She has just been using triple antibiotic ointment and a Band-Aid at this point. On the back of her left heel, there is a small circular wound in the otherwise well-healed scar line. There is some dry eschar around the edges. The wound itself is just a couple of  millimeters in diameter. No concern for infection. 06/23/2022: The wound is basically unchanged in size today. There is some eschar and dried collagen accumulation. 06/30/2022: The wound is unchanged in size. There is eschar and slough accumulation. The surface appears quite healthy and I think the wound may be a bit shallower. 07/14/2022: The wound is really unchanged overall. There is some eschar and slough accumulation. 07/24/2022: Her wound  is little bit narrower underneath some eschar. She has a referral in to a wound care center closer to her new home, but is waiting to confirm it is in network for her insurance. 08/03/2022: There is not seem to be much significant change to her wound. There is some slough and eschar that has reaccumulated. She is waiting until May to begin treatment at the wound care center near her new home. 08/10/2022: No significant change to her wound. She has been in communication with Dr. Allena Katz. He is sending her for repeat vascular studies as well as consultation with Dr. Ardelle Anton. 08/17/2022: The wound actually measured about a millimeter smaller. There is some eschar and slough on the surface. She is going to have a CT angiogram on Monday of next week and then a telephone visit with Dr. Loreta Ave. It turns out this is with Dr. Ruthy Dick, the interventional radiologist, rather than Dr. Ovid Curd, Dr. Eliane Decree partner, which makes more sense. Electronic Signature(s) Signed: 08/17/2022 3:55:27 PM By: Duanne Guess MD FACS Entered By: Duanne Guess on 08/17/2022 15:55:27 -------------------------------------------------------------------------------- Physical Exam Details Patient Name: Date of Service: [name], DA [name] 08/17/2022 2:45 PM Medical Record Number: [id number] Patient Account Number: [id number] Date of Birth/Sex: Treating RN: 08-21-1955 (67 y.o. F) Primary Care Provider: Docia Chuck, [name] Other Clinician: Referring Provider: Treating Provider/Extender:  Leroy Libman, [name] Weeks in Treatment: 9 Constitutional Hypertensive, asymptomatic. . . . no acute distress. Respiratory Normal work of breathing on room air. Notes 08/17/2022: The wound actually measured about a millimeter smaller. There is some eschar and slough on the surface Electronic Signature(s) Signed: 08/17/2022 3:59:09 PM By: Duanne Guess MD FACS Entered By: Duanne Guess on 08/17/2022 15:59:09 Wisniewski, Earna Coder ([id number]) [id number] Page 3 of 8 -------------------------------------------------------------------------------- Physician Orders Details Patient Name: Date of Service: [facility] DES, DA [name] 08/17/2022 2:45 PM Medical Record Number: [id number] Patient Account Number: [id number] Date of Birth/Sex: Treating RN: 1955-08-03 (67 y.o. [name] Primary Care Provider: Docia Chuck, [name] Other Clinician: Referring Provider: Treating Provider/Extender: Leroy Libman, [name] Weeks in Treatment: 9 Verbal / Phone Orders: No Diagnosis Coding ICD-10 Coding Code Description L97.422 Non-pressure chronic ulcer of left heel and midfoot with fat layer exposed T81.31XS Disruption of external operation (surgical) wound, not elsewhere classified, sequela I10 Essential (primary) hypertension Discharge From [facility] Services Discharge from Wound Care Center - Make appointment to continue wound care at [facility] for wound care Anesthetic Wound #1 Left Achilles (In clinic) Topical Lidocaine 4% applied to wound bed Bathing/ Shower/ Hygiene May shower and wash wound with soap and water. - Make sure to dry wound area really well Edema Control - Lymphedema / SCD / Other Left Lower Extremity Elevate legs to the level of the heart or above for 30 minutes daily and/or when sitting for 3-4 times a day throughout the day. Exercise regularly Moisturize legs daily. Wound Treatment Wound #1 - Achilles Wound Laterality:  Left Cleanser: Soap and Water 1 x Per Day/30 Days Discharge Instructions: May shower and wash wound with dial antibacterial soap and water prior to dressing change. Cleanser: Byram Ancillary Kit - 15 Day Supply (Generic) 1 x Per Day/30 Days Discharge Instructions: Use supplies as instructed; Kit contains: (15) Saline Bullets; (15) 3x3 Gauze; 15 pr Gloves Peri-Wound Care: Sween Lotion (Moisturizing lotion) 1 x Per Day/30 Days Discharge Instructions: Apply moisturizing lotion as directed Topical: Mupirocin Ointment 1 x Per Day/30 Days Discharge Instructions: Apply Mupirocin (Bactroban) as instructed Prim Dressing: Endoform 2x2 in (  Generic) 1 x Per Day/30 Days ary Discharge Instructions: Moisten with saline Secondary Dressing: Zetuvit Plus Silicone Border Dressing 4x4 (in/in) (Generic) 1 x Per Day/30 Days Discharge Instructions: Apply silicone border over primary dressing as directed. Patient Medications llergies: elemental sulfur, lisinopril, Zoloft, atenolol A Notifications Medication Indication Start End 08/17/2022 lidocaine DOSE topical 5 % ointment - ointment topical once daily Electronic Signature(s) Signed: 08/17/2022 4:24:20 PM By: Duanne Guess MD FACS Previous Signature: 08/17/2022 3:29:20 PM Version By: Redmond Pulling RN, BSN Entered By: Duanne Guess on 08/17/2022 15:59:28 Rainbow, Earna Coder ([id number]) [id number] Page 4 of 8 -------------------------------------------------------------------------------- Problem List Details Patient Name: Date of Service: [facility] DES, DA [name] 08/17/2022 2:45 PM Medical Record Number: [id number] Patient Account Number: [id number] Date of Birth/Sex: Treating RN: [birth year] (67 y.o. F) Primary Care Provider: Docia Chuck, [name] Other Clinician: Referring Provider: Treating Provider/Extender: Leroy Libman, [name] Weeks in Treatment: 9 Active Problems ICD-10 Encounter Code Description Active Date  MDM Diagnosis L97.422 Non-pressure chronic ulcer of left heel and midfoot with fat layer exposed 06/14/2022 No Yes T81.31XS Disruption of external operation (surgical) wound, not elsewhere classified, 06/14/2022 No Yes sequela I10 Essential (primary) hypertension 06/14/2022 No Yes Inactive Problems Resolved Problems Electronic Signature(s) Signed: 08/17/2022 3:52:41 PM By: Duanne Guess MD FACS Entered By: Duanne Guess on 08/17/2022 15:52:41 -------------------------------------------------------------------------------- Progress Note Details Patient Name: Date of Service: [name], DA [name] 08/17/2022 2:45 PM Medical Record Number: [id number] Patient Account Number: [id number] Date of Birth/Sex: Treating RN: 07-01-1955 (67 y.o. F) Primary Care Provider: Docia Chuck, [name] Other Clinician: Referring Provider: Treating Provider/Extender: Leroy Libman, [name] Weeks in Treatment: 9 Subjective Chief Complaint Information obtained from Patient Patient presents to the wound care center with open non-healing surgical wound(s) History of Present Illness (HPI) ADMISSION 06/14/2022 This is a 67 year old otherwise quite healthy woman who underwent repair of the Haglund's deformity and ruptured Achilles tendon back in August 2023. The wound subsequently became infected and dehisced. She ended up hospitalized for the infection and has since been on multiple courses of oral antibiotics along with use of a wound VAC for a time then Betadine wet-to-dry dressing changes. [place] sure that her wound was healed and she would be able to complete her move without difficulty. She has just been using triple antibiotic ointment and a Band-Aid at this point. On the back of her left heel, there is a small circular wound in the otherwise well-healed scar line. There is some dry eschar around the edges. The wound itself is just a couple of  millimeters in diameter. No concern for infection. 06/23/2022: The wound is basically unchanged in size today. There is some eschar and dried collagen accumulation. 06/30/2022: The wound is unchanged in size. There is eschar and slough accumulation. The surface appears quite healthy and I think the wound may be a bit shallower. 07/14/2022: The wound is really unchanged overall. There is some eschar and slough accumulation. 07/24/2022: Her wound is little bit narrower underneath some eschar. She has a referral in to a wound care center closer to her new home, but is waiting to confirm it is in network for her insurance. 08/03/2022: There is not seem to be much significant change to her wound. There is some slough and eschar that has reaccumulated. She is waiting until May to begin treatment at the wound care center near her new home. LITICIA, GASIOR ([id number]) [id number] Page 5 of 8 08/10/2022: No significant  change to her wound. She has been in communication with Dr. Allena Katz. He is sending her for repeat vascular studies as well as consultation with Dr. Ardelle Anton. 08/17/2022: The wound actually measured about a millimeter smaller. There is some eschar and slough on the surface. She is going to have a CT angiogram on Monday of next week and then a telephone visit with Dr. Loreta Ave. It turns out this is with Dr. Ruthy Dick, the interventional radiologist, rather than Dr. Ovid Curd, Dr. Eliane Decree partner, which makes more sense. Patient History Family History Diabetes - Maternal Grandparents,Mother, Heart Disease - Maternal Grandparents,Mother, Hypertension - Maternal Grandparents,Mother, Lung Disease - Father, Stroke - Mother,Maternal Grandparents, No family history of Cancer, Hereditary Spherocytosis, Kidney Disease, Seizures, Thyroid Problems, Tuberculosis. Social History Never smoker, Marital Status - Widowed, Alcohol Use - Never, Drug Use - No History, Caffeine Use -  Daily. Medical History Eyes Patient has history of Cataracts - bilateral Denies history of Glaucoma, Optic Neuritis Ear/Nose/Mouth/Throat Denies history of Chronic sinus problems/congestion, Middle ear problems Hematologic/Lymphatic Denies history of Anemia, Hemophilia, Human Immunodeficiency Virus, Lymphedema Respiratory Denies history of Aspiration, Asthma, Chronic Obstructive Pulmonary Disease (COPD), Pneumothorax, Sleep Apnea, Tuberculosis Cardiovascular Patient has history of Arrhythmia - sinus tachycardia, Hypertension Denies history of Hypotension, Myocardial Infarction, Peripheral Arterial Disease, Peripheral Venous Disease, Phlebitis, Vasculitis Gastrointestinal Denies history of Cirrhosis , Colitis, Crohnoos, Hepatitis A, Hepatitis B, Hepatitis C Genitourinary Denies history of End Stage Renal Disease Immunological Denies history of Lupus Erythematosus, Raynaudoos, Scleroderma Integumentary (Skin) Denies history of History of Burn Musculoskeletal Patient has history of Osteoarthritis - bilateral knees Denies history of Gout, Rheumatoid Arthritis, Osteomyelitis Neurologic Denies history of Dementia, Neuropathy, Quadriplegia, Paraplegia, Seizure Disorder Psychiatric Denies history of Anorexia/bulimia, Confinement Anxiety Objective Constitutional Hypertensive, asymptomatic. no acute distress. Vitals Time Taken: 2:45 PM, Height: 64 in, Weight: 210 lbs, BMI: 36, Temperature: 98.0 F, Pulse: 76 bpm, Respiratory Rate: 18 breaths/min, Blood Pressure: 158/92 mmHg. Respiratory Normal work of breathing on room air. General Notes: 08/17/2022: The wound actually measured about a millimeter smaller. There is some eschar and slough on the surface Integumentary (Hair, Skin) Wound #1 status is Open. Original cause of wound was Surgical Injury. The date acquired was: 12/26/2021. The wound has been in treatment 9 weeks. The wound is located on the Left Achilles. The wound measures 0.3cm  length x 0.2cm width x 0.1cm depth; 0.047cm^2 area and 0.005cm^3 volume. There is Fat Layer (Subcutaneous Tissue) exposed. There is no tunneling or undermining noted. There is a medium amount of serous drainage noted. There is large (67-100%) pink, pale granulation within the wound bed. There is a small (1-33%) amount of necrotic tissue within the wound bed including Eschar and Adherent Slough. The periwound skin appearance exhibited: Scarring, Dry/Scaly, Rubor. The periwound skin appearance did not exhibit: Maceration. Periwound temperature was noted as No Abnormality. The periwound has tenderness on palpation. Assessment Active Problems ICD-10 Non-pressure chronic ulcer of left heel and midfoot with fat layer exposed Disruption of external operation (surgical) wound, not elsewhere classified, sequela NEASIA, FLEEMAN ([id number]) [phone].pdf Page 6 of 8 Essential (primary) hypertension Procedures Wound #1 Pre-procedure diagnosis of Wound #1 is a Dehisced Wound located on the Left Achilles . There was a Selective/Open Wound Non-Viable Tissue Debridement with a total area of 0.05 sq cm performed by Duanne Guess, MD. With the following instrument(s): Curette to remove Non-Viable tissue/material. Material removed includes [facility] after achieving pain control using Lidocaine 5% topical ointment. No specimens were taken. A time out was conducted  at 15:00, prior to the start of the procedure. A Minimum amount of bleeding was controlled with Pressure. The procedure was tolerated well with a pain level of 0 throughout and a pain level of 0 following the procedure. Post Debridement Measurements: 0.3cm length x 0.2cm width x 0.1cm depth; 0.005cm^3 volume. Character of Wound/Ulcer Post Debridement is improved. Post procedure Diagnosis Wound #1: Same as Pre-Procedure General Notes: Scribed for Dr Lady Gary by Redmond Pulling, RN. Plan Discharge From [facility] Services: Discharge from  Wound Care Center - Make appointment to continue wound care at [facility] for wound care Anesthetic: Wound #1 Left Achilles: (In clinic) Topical Lidocaine 4% applied to wound bed Bathing/ Shower/ Hygiene: May shower and wash wound with soap and water. - Make sure to dry wound area really well Edema Control - Lymphedema / SCD / Other: Elevate legs to the level of the heart or above for 30 minutes daily and/or when sitting for 3-4 times a day throughout the day. Exercise regularly Moisturize legs daily. The following medication(s) was prescribed: lidocaine topical 5 % ointment ointment topical once daily was prescribed at facility WOUND #1: - Achilles Wound Laterality: Left Cleanser: Soap and Water 1 x Per Day/30 Days Discharge Instructions: May shower and wash wound with dial antibacterial soap and water prior to dressing change. Cleanser: Byram Ancillary Kit - 15 Day Supply (Generic) 1 x Per Day/30 Days Discharge Instructions: Use supplies as instructed; Kit contains: (15) Saline Bullets; (15) 3x3 Gauze; 15 pr Gloves Peri-Wound Care: Sween Lotion (Moisturizing lotion) 1 x Per Day/30 Days Discharge Instructions: Apply moisturizing lotion as directed Topical: Mupirocin Ointment 1 x Per Day/30 Days Discharge Instructions: Apply Mupirocin (Bactroban) as instructed Prim Dressing: Endoform 2x2 in (Generic) 1 x Per Day/30 Days ary Discharge Instructions: Moisten with saline Secondary Dressing: Zetuvit Plus Silicone Border Dressing 4x4 (in/in) (Generic) 1 x Per Day/30 Days Discharge Instructions: Apply silicone border over primary dressing as directed. 08/17/2022: The wound actually measured about a millimeter smaller. There is some eschar and slough on the surface I used a curette to debride slough and eschar from the wound. We will continue topical mupirocin with endoform. The patient reports that she has been able to get an appointment with the wound care center that is located  closer to her home in [place]. She would like to transfer her care there. We will discharge her to follow-up at that location. She will see [place] on an as-needed basis. Electronic Signature(s) Signed: 08/17/2022 4:01:51 PM By: Duanne Guess MD FACS Entered By: Duanne Guess on 08/17/2022 16:01:51 -------------------------------------------------------------------------------- HxROS Details Patient Name: Date of Service: [name], DA [name] 08/17/2022 2:45 PM Medical Record Number: [id number] Patient Account Number: [id number] Date of Birth/Sex: Treating RN: 16-Jan-1956 (67 y.o. F) Primary Care Provider: Docia Chuck, [name] Other Clinician: Referring Provider: Treating Provider/Extender: Leroy Libman, [name] Weeks in Treatment: 9 Eyes Medical History: Positive for: Cataracts - bilateral Grubb, Altair K ([id number]) [phone].pdf Page 7 of 8 Negative for: Glaucoma; Optic Neuritis Ear/Nose/Mouth/Throat Medical History: Negative for: Chronic sinus problems/congestion; Middle ear problems Hematologic/Lymphatic Medical History: Negative for: Anemia; Hemophilia; Human Immunodeficiency Virus; Lymphedema Respiratory Medical History: Negative for: Aspiration; Asthma; Chronic Obstructive Pulmonary Disease (COPD); Pneumothorax; Sleep Apnea; Tuberculosis Cardiovascular Medical History: Positive for: Arrhythmia - sinus tachycardia; Hypertension Negative for: Hypotension; Myocardial Infarction; Peripheral Arterial Disease; Peripheral Venous Disease; Phlebitis; Vasculitis Gastrointestinal Medical History: Negative for: Cirrhosis ; Colitis; Crohns; Hepatitis A; Hepatitis B; Hepatitis C Genitourinary Medical History: Negative for: End Stage Renal Disease Immunological  Medical History: Negative for: Lupus Erythematosus; Raynauds; Scleroderma Integumentary (Skin) Medical History: Negative for: History of Burn Musculoskeletal Medical History: Positive  for: Osteoarthritis - bilateral knees Negative for: Gout; Rheumatoid Arthritis; Osteomyelitis Neurologic Medical History: Negative for: Dementia; Neuropathy; Quadriplegia; Paraplegia; Seizure Disorder Psychiatric Medical History: Negative for: Anorexia/bulimia; Confinement Anxiety HBO Extended History Items Eyes: Cataracts Immunizations Pneumococcal Vaccine: Received Pneumococcal Vaccination: Yes Received Pneumococcal Vaccination On or After 60th Birthday: Yes Implantable Devices None Family and Social History Cancer: No; Diabetes: Yes - Maternal Grandparents,Mother; Heart Disease: Yes - Maternal Grandparents,Mother; Hereditary Spherocytosis: No; Hypertension: Yes - Maternal Grandparents,Mother; Kidney Disease: No; Lung Disease: Yes - Father; Seizures: No; Stroke: Yes - Mother,Maternal Grandparents; Thyroid Problems: No; Tuberculosis: No; Never smoker; Marital Status - Widowed; Alcohol Use: Never; Drug Use: No History; Caffeine Use: Daily; Financial Concerns: No; Food, Clothing or Shelter Needs: No; Support System Lacking: No; Transportation Concerns: No [name], [name] ([id number]) [id number] Page 8 of 8 Electronic Signature(s) Signed: 08/17/2022 4:24:20 PM By: Duanne Guess MD FACS Entered By: Duanne Guess on 08/17/2022 15:57:52 -------------------------------------------------------------------------------- SuperBill Details Patient Name: Date of Service: [facility] DES, DA [name] 08/17/2022 Medical Record Number: [id number] Patient Account Number: [id number] Date of Birth/Sex: Treating RN: [birth year] (66 y.o. F) Primary Care Provider: Docia Chuck, [name] Other Clinician: Referring Provider: Treating Provider/Extender: Leroy Libman, [name] Weeks in Treatment: 9 Diagnosis Coding ICD-10 Codes Code Description [phone] Non-pressure chronic ulcer of left heel and midfoot with fat layer exposed T81.31XS Disruption of external operation  (surgical) wound, not elsewhere classified, sequela I10 Essential (primary) hypertension Facility Procedures : CPT4 Code: [id number] Description: 97597 - DEBRIDE WOUND 1ST 20 SQ CM OR < ICD-10 Diagnosis Description L97.422 Non-pressure chronic ulcer of left heel and midfoot with fat layer exposed Modifier: Quantity: 1 Physician Procedures : CPT4 Code Description Modifier [id number] - WC PHYS LEVEL 4 - EST PT 25 ICD-10 Diagnosis Description L97.422 Non-pressure chronic ulcer of left heel and midfoot with fat layer exposed T81.31XS Disruption of external operation (surgical) wound, not  elsewhere classified, sequela I10 Essential (primary) hypertension Quantity: 1 : [id number] 97597 - WC PHYS DEBR WO ANESTH 20 SQ CM ICD-10 Diagnosis Description L97.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

## 2022-08-18 NOTE — Progress Notes (Addendum)
RONNAE, KASER ([id number]) [id number] Page 1 of 6 Visit Report for 08/17/2022 Arrival Information Details Patient Name: Date of Service: [facility] DES, [place] 08/17/2022 2:45 PM Medical Record Number: [id number] Patient Account Number: [id number] Date of Birth/Sex: Treating RN: [birth year] (67 y.o. Orville Govern Primary Care Shemeika Starzyk: Docia Chuck, [name] Other Clinician: Referring Briani Maul: Treating Semaj Kham/Extender: Leroy Libman, [name] Weeks in Treatment: 9 Visit Information History Since Last Visit Added or deleted any medications: No Patient Arrived: Ambulatory Any new allergies or adverse reactions: No Arrival Time: 14:45 Had a fall or experienced change in No Accompanied By: sister in law activities of daily living that may affect Transfer Assistance: None risk of falls: Patient Identification Verified: Yes Signs or symptoms of abuse/neglect since last visito No Secondary Verification Process Completed: Yes Hospitalized since last visit: No Patient Requires Transmission-Based Precautions: No Implantable device outside of the clinic excluding No Patient Has Alerts: Yes cellular tissue based products placed in the center Patient Alerts: ABI LLE 1.11 since last visit: ABI RLE 1.04 Has Dressing in Place as Prescribed: Yes Pain Present Now: Yes Electronic Signature(s) Signed: 08/17/2022 3:29:20 PM By: Redmond Pulling RN, BSN Entered By: Redmond Pulling on 08/17/2022 14:46:20 -------------------------------------------------------------------------------- Encounter Discharge Information Details Patient Name: Date of Service: [facility] DES, [place] [name] 08/17/2022 2:45 PM Medical Record Number: [id number] Patient Account Number: [id number] Date of Birth/Sex: Treating RN: 04/14/1956 (67 y.o. Orville Govern Primary Care Brannon Levene: Docia Chuck, [name] Other Clinician: Referring Laylia Mui: Treating Scottlynn Lindell/Extender: Leroy Libman, [name] Weeks  in Treatment: 9 Encounter Discharge Information Items Post Procedure Vitals Discharge Condition: Stable Temperature (F): 98.0 Ambulatory Status: Ambulatory Pulse (bpm): 76 Discharge Destination: Home Respiratory Rate (breaths/min): 18 Transportation: Private Auto Blood Pressure (mmHg): 158/92 Accompanied By: sister in law Schedule Follow-up Appointment: Yes Clinical Summary of Care: Patient Declined Electronic Signature(s) Signed: 08/17/2022 3:29:20 PM By: Redmond Pulling RN, BSN Entered By: Redmond Pulling on 08/17/2022 15:09:15 -------------------------------------------------------------------------------- Lower Extremity Assessment Details Patient Name: Date of Service: [facility] DES, [place] [name] 08/17/2022 2:45 PM Medical Record Number: [id number] Patient Account Number: [id number] Date of Birth/Sex: Treating RN: [birth year] (67 y.o. Orville Govern Primary Care Tekia Waterbury: Docia Chuck, [name] Other Clinician: Referring Nasya Vincent: Treating Arionna Hoggard/Extender: Leroy Libman, [name] Weeks in Treatment: 9 Edema Assessment Assessed: [Left: No] [Right: No] R[LeftANOUSHKA, DIVITO ([id number])] [Right: [id number] Page 2 of 6] [Left: Edema] [Right: :] Calf Left: Right: Point of Measurement: From Medial Instep 40.5 cm Ankle Left: Right: Point of Measurement: From Medial Instep 23 cm Vascular Assessment Pulses: Dorsalis Pedis Palpable: [Left:Yes] Electronic Signature(s) Signed: 08/17/2022 3:29:20 PM By: Redmond Pulling RN, BSN Entered By: Redmond Pulling on 08/17/2022 14:51:50 -------------------------------------------------------------------------------- Multi Wound Chart Details Patient Name: Date of Service: [facility] DES, DA [name] 08/17/2022 2:45 PM Medical Record Number: [id number] Patient Account Number: [id number] Date of Birth/Sex: Treating RN: May 01, 1955 (67 y.o. F) Primary Care Rees Santistevan: Docia Chuck, [name] Other Clinician: Referring Kuzey Ogata: Treating  Tionna Gigante/Extender: Leroy Libman, [name] Weeks in Treatment: 9 Vital Signs Height(in): 64 Pulse(bpm): 76 Weight(lbs): 210 Blood Pressure(mmHg): 158/92 Body Mass Index(BMI): 36 Temperature(F): 98.0 Respiratory Rate(breaths/min): 18 [1:Photos:] [N/A:N/A] Left Achilles N/A N/A Wound Location: Surgical Injury N/A N/A Wounding Event: Dehisced Wound N/A N/A Primary Etiology: Cataracts, Arrhythmia, Hypertension, N/A N/A Comorbid History: Osteoarthritis 12/26/2021 N/A N/A Date Acquired: 9 N/A N/A Weeks of Treatment: Open N/A N/A Wound Status: No N/A N/A Wound Recurrence: 0.3x0.2x0.1 N/A N/A Measurements L x W x D (cm) 0.047 N/A N/A A (cm) : rea 0.005 N/A N/A Volume (cm) :  33.80% N/A N/A % Reduction in Area: 28.60% N/A N/A % Reduction in Volume: Full Thickness Without Exposed N/A N/A Classification: Support Structures Medium N/A N/A Exudate A mount: Serous N/A N/A Exudate Type: amber N/A N/A Exudate Color: Large (67-100%) N/A N/A Granulation Amount: Pink, Pale N/A N/A Granulation Quality: Small (1-33%) N/A N/A Necrotic Amount: Eschar, Adherent Slough N/A N/A Necrotic Tissue: Fat Layer (Subcutaneous Tissue): Yes N/A N/A Exposed Structures: Fascia: No GERILYNN, MCCULLARS ([id number]) [id number].pdf Page 3 of 6 Tendon: No Muscle: No Joint: No Bone: No Small (1-33%) N/A N/A Epithelialization: Debridement - Selective/Open Wound N/A N/A Debridement: Pre-procedure Verification/Time Out 15:00 N/A N/A Taken: Lidocaine 5% topical ointment N/A N/A Pain Control: Slough N/A N/A Tissue Debrided: Non-Viable Tissue N/A N/A Level: 0.05 N/A N/A Debridement A (sq cm): rea Curette N/A N/A Instrument: Minimum N/A N/A Bleeding: Pressure N/A N/A Hemostasis A chieved: 0 N/A N/A Procedural Pain: 0 N/A N/A Post Procedural Pain: Procedure was tolerated well N/A N/A Debridement Treatment Response: 0.3x0.2x0.1 N/A N/A Post  Debridement Measurements L x W x D (cm) 0.005 N/A N/A Post Debridement Volume: (cm) Scarring: Yes N/A N/A Periwound Skin Texture: Dry/Scaly: Yes N/A N/A Periwound Skin Moisture: Maceration: No Rubor: Yes N/A N/A Periwound Skin Color: No Abnormality N/A N/A Temperature: Yes N/A N/A Tenderness on Palpation: Debridement N/A N/A Procedures Performed: Treatment Notes Wound #1 (Achilles) Wound Laterality: Left Cleanser Soap and Water Discharge Instruction: May shower and wash wound with dial antibacterial soap and water prior to dressing change. Byram Ancillary Kit - 15 Day Supply Discharge Instruction: Use supplies as instructed; Kit contains: (15) Saline Bullets; (15) 3x3 Gauze; 15 pr Gloves Peri-Wound Care Sween Lotion (Moisturizing lotion) Discharge Instruction: Apply moisturizing lotion as directed Topical Mupirocin Ointment Discharge Instruction: Apply Mupirocin (Bactroban) as instructed Primary Dressing Endoform 2x2 in Discharge Instruction: Moisten with saline Secondary Dressing Zetuvit Plus Silicone Border Dressing 4x4 (in/in) Discharge Instruction: Apply silicone border over primary dressing as directed. Secured With Compression Wrap Compression Stockings Facilities manager) Signed: 08/17/2022 3:52:47 PM By: Duanne Guess MD FACS Entered By: Duanne Guess on 08/17/2022 15:52:47 -------------------------------------------------------------------------------- Multi-Disciplinary Care Plan Details Patient Name: Date of Service: [facility] DES, [place] [name] 08/17/2022 2:45 PM Medical Record Number: [id number] Patient Account Number: [id number] Date of Birth/Sex: Treating RN: 12/16/1955 (67 y.o. Orville Govern Primary Care Trana Ressler: [place], [place] Other Clinician: Referring Annmarie Plemmons: Treating Eudora Guevarra/Extender: Leroy Libman, [name] Thompson, Earna Coder ([id number]) [id number] Page 4 of 6 Weeks in Treatment: 9 Active  Inactive Electronic Signature(s) Signed: 08/17/2022 3:29:20 PM By: Redmond Pulling RN, BSN Entered By: Redmond Pulling on 08/17/2022 15:26:18 -------------------------------------------------------------------------------- Pain Assessment Details Patient Name: Date of Service: [facility] DES, DA [name] 08/17/2022 2:45 PM Medical Record Number: [id number] Patient Account Number: [id number] Date of Birth/Sex: Treating RN: 03-May-1955 (67 y.o. Orville Govern Primary Care Janaiah Vetrano: Docia Chuck, [name] Other Clinician: Referring Neil Errickson: Treating Vern Guerette/Extender: Leroy Libman, [name] Weeks in Treatment: 9 Active Problems Location of Pain Severity and Description of Pain Patient Has Paino Yes Site Locations Rate the pain. Current Pain Level: 2 Pain Management and Medication Current Pain Management: Electronic Signature(s) Signed: 08/17/2022 3:29:20 PM By: Redmond Pulling RN, BSN Entered By: Redmond Pulling on 08/17/2022 14:46:54 -------------------------------------------------------------------------------- Patient/Caregiver Education Details Patient Name: Date of Service: [facility] DES, DA Ozella Almond 4/25/2024andnbsp2:45 PM Medical Record Number: [id number] Patient Account Number: [id number] Date of Birth/Gender: Treating RN: [birth year] (67 y.o. Orville Govern Primary Care Physician: Docia Chuck, [place] Other Clinician: Referring Physician: Treating Physician/Extender: Leroy Libman, [name] Tania Ade  in Treatment: 9 Education Assessment Education Provided To: Patient Education Topics Provided Wound/Skin ImpairmentETNA, FORQUER ([id number]) [id number] Page 5 of 6 Methods: Explain/Verbal Responses: State content correctly Electronic Signature(s) Signed: 08/17/2022 3:29:20 PM By: Redmond Pulling RN, BSN Entered By: Redmond Pulling on 08/17/2022 14:55:16 -------------------------------------------------------------------------------- Wound Assessment  Details Patient Name: Date of Service: [facility] DES, DA [name] 08/17/2022 2:45 PM Medical Record Number: [id number] Patient Account Number: [id number] Date of Birth/Sex: Treating RN: April 10, 1956 (67 y.o. Orville Govern Primary Care Shelbylynn Walczyk: Docia Chuck, [name] Other Clinician: Referring Elycia Woodside: Treating Sydney Hasten/Extender: Leroy Libman, [name] Weeks in Treatment: 9 Wound Status Wound Number: 1 Primary Etiology: Dehisced Wound Wound Location: Left Achilles Wound Status: Open Wounding Event: Surgical Injury Comorbid History: Cataracts, Arrhythmia, Hypertension, Osteoarthritis Date Acquired: 12/26/2021 Weeks Of Treatment: 9 Clustered Wound: No Photos Wound Measurements Length: (cm) 0.3 Width: (cm) 0.2 Depth: (cm) 0.1 Area: (cm) 0.047 Volume: (cm) 0.005 % Reduction in Area: 33.8% % Reduction in Volume: 28.6% Epithelialization: Small (1-33%) Tunneling: No Undermining: No Wound Description Classification: Full Thickness Without Exposed Support Structures Exudate Amount: Medium Exudate Type: Serous Exudate Color: amber Foul Odor After Cleansing: No Slough/Fibrino Yes Wound Bed Granulation Amount: Large (67-100%) Exposed Structure Granulation Quality: Pink, Pale Fascia Exposed: No Necrotic Amount: Small (1-33%) Fat Layer (Subcutaneous Tissue) Exposed: Yes Necrotic Quality: Eschar, Adherent Slough Tendon Exposed: No Muscle Exposed: No Joint Exposed: No Bone Exposed: No Periwound Skin Texture Texture Color No Abnormalities Noted: No No Abnormalities Noted: No Scarring: Yes Rubor: Yes Moisture Temperature / Pain No Abnormalities Noted: No Temperature: No Abnormality Dry / Scaly: Yes Tenderness on Palpation: Yes Maceration: No ARDIS, LAWLEY K ([id number]) [id number].pdf Page 6 of 6 Treatment Notes Wound #1 (Achilles) Wound Laterality: Left Cleanser Soap and Water Discharge Instruction: May shower and wash wound with dial  antibacterial soap and water prior to dressing change. Byram Ancillary Kit - 15 Day Supply Discharge Instruction: Use supplies as instructed; Kit contains: (15) Saline Bullets; (15) 3x3 Gauze; 15 pr Gloves Peri-Wound Care Sween Lotion (Moisturizing lotion) Discharge Instruction: Apply moisturizing lotion as directed Topical Mupirocin Ointment Discharge Instruction: Apply Mupirocin (Bactroban) as instructed Primary Dressing Endoform 2x2 in Discharge Instruction: Moisten with saline Secondary Dressing Zetuvit Plus Silicone Border Dressing 4x4 (in/in) Discharge Instruction: Apply silicone border over primary dressing as directed. Secured With Compression Wrap Compression Stockings Facilities manager) Signed: 08/17/2022 3:29:20 PM By: Redmond Pulling RN, BSN Entered By: Redmond Pulling on 08/17/2022 14:53:25 -------------------------------------------------------------------------------- Vitals Details Patient Name: Date of Service: [facility] DES, DA [name] 08/17/2022 2:45 PM Medical Record Number: [id number] Patient Account Number: [id number] Date of Birth/Sex: Treating RN: Jul 09, 1955 (67 y.o. Orville Govern Primary Care Rianne Degraaf: Docia Chuck, [name] Other Clinician: Referring Margery Szostak: Treating Johnryan Sao/Extender: Leroy Libman, [name] Weeks in Treatment: 9 Vital Signs Time Taken: 14:45 Temperature (F): 98.0 Height (in): 64 Pulse (bpm): 76 Weight (lbs): 210 Respiratory Rate (breaths/min): 18 Body Mass Index (BMI): 36 Blood Pressure (mmHg): 158/92 Reference Range: 80 - 120 mg / dl Electronic Signature(s) Signed: 08/17/2022 3:29:20 PM By: Redmond Pulling RN, BSN Entered By: Redmond Pulling on 08/17/2022 14:45:45

## 2022-08-20 NOTE — Progress Notes (Unsigned)
CARDIOLOGY CONSULT NOTE       Patient ID: JESSYCA SLOAN MRN: [id number] DOB/AGE: 67/67/1957 67 y.o.  Admit date: (Not on file) Referring Physician: Elisha Headland Pulmonary  Primary Physician: Darrow Bussing, MD Primary Cardiologist: Eden Emms  Reason for Consultation: SVT  Active Problems:   * No active hospital problems. *   HPI:  67 y.o. referred by Elisha Headland and Alvester Chou Annabella on 06/21/20  for SVT. Seen in ER 06/09/20 for tachycardia Hospitalization for COVID/bacterial pneumonia. She has sinus tachycardia during hospitalization and was d/c on metoprolol. HR initially 190 bpm give cardizem on route to ED HR 150's Concerted with adenosine. D/c with lopressor 75 bid for ST and PaCls  She was at PT and they noted elevated HR. She had no chest pain dyspnea palpitations or syncope Rx with antibiotics and amoxicillin for pneumococcal bacteremia. CT negative for PE She had left pleural effusion that could not be tapped TTE done 06/01/20 with EF 60-65% no significant valve disease F/U CXR 06/09/20 with persistent left basilar consolidation and effusion unchanged   ECG 06/09/20 showed narrow complex tachycardia rate 150 bpm post adenosine clear p waves ST rate 102   History of HTN, hepatitis C asthma depression non smoker   She is unvaccinated She had partial knee replacement 04/29/20 DOAC stopped with negative LE venous duplex and CTA for PE   She is widowed Husband committed suicide from carbon monoxide in 2016 Calcium score was 0 in 2017  She had back and left foot surgery  August 2022 seeing wound center after infection from achilles surgery Her ABI's have been normal She sees Dr Delice Bison at wound center and Dr Ardelle Anton IR Radiology Plan for CTA run off to make sure circulation ok ***  BP better controlled with Norvasc and Diovan/HCTZ  ***  ROS All other systems reviewed and negative except as noted above  Past Medical History:  Diagnosis Date   Asthma    Asthma    Depression     Depression    Depression    Erosive esophagitis    Headaches, cluster    Hepatitis C    HTN (hypertension)    HTN (hypertension)    HTN (hypertension)    Hypokalemia 05/29/2020    Family History  Problem Relation Age of Onset   Emphysema Father    Coronary artery disease Mother        CABG   Diabetes Mellitus II Mother    Heart disease Mother    Stroke Brother    Heart attack Sister    Crohn's disease Sister    Liver disease Sister    Heart disease Brother    Colitis Brother    Heart disease Brother    Irritable bowel syndrome Daughter    Bipolar disorder Daughter    Breast cancer Neg Hx     Social History   Socioeconomic History   Marital status: Widowed    Spouse name: Not on file   Number of children: Not on file   Years of education: Not on file   Highest education level: Not on file  Occupational History   Not on file  Tobacco Use   Smoking status: Never   Smokeless tobacco: Never  Substance and Sexual Activity   Alcohol use: Not Currently    Alcohol/week: 0.0 standard drinks of alcohol   Drug use: Not on file   Sexual activity: Not on file  Other Topics Concern   Not on file  Social  History Narrative   Not on file   Social Determinants of Health   Financial Resource Strain: Not on file  Food Insecurity: No Food Insecurity (12/28/2021)   Hunger Vital Sign    Worried About Running Out of Food in the Last Year: Never true    Ran Out of Food in the Last Year: Never true  Transportation Needs: No Transportation Needs (12/28/2021)   PRAPARE - Administrator, Civil Service (Medical): No    Lack of Transportation (Non-Medical): No  Physical Activity: Not on file  Stress: Not on file  Social Connections: Not on file  Intimate Partner Violence: Not At Risk (12/28/2021)   Humiliation, Afraid, Rape, and Kick questionnaire    Fear of Current or Ex-Partner: No    Emotionally Abused: No    Physically Abused: No    Sexually Abused: No    Past  Surgical History:  Procedure Laterality Date   IR RADIOLOGIST EVAL & MGMT  02/10/2022   IR RADIOLOGIST EVAL & MGMT  08/11/2022   KNEE SURGERY  04/2020      Current Outpatient Medications:    amLODipine (NORVASC) 5 MG tablet, Take 1 tablet (5 mg total) by mouth daily., Disp: 30 tablet, Rfl: 0   amoxicillin-clavulanate (AUGMENTIN) 875-125 MG tablet, Take 1 tablet by mouth 2 (two) times daily., Disp: 20 tablet, Rfl: 0   amoxicillin-clavulanate (AUGMENTIN) 875-125 MG tablet, Take 1 tablet by mouth 2 (two) times daily., Disp: 60 tablet, Rfl: 0   Calcium Carb-Cholecalciferol (CALCIUM-VITAMIN D3) 600-400 MG-UNIT TABS, Take 1 tablet by mouth daily., Disp: , Rfl:    collagenase (SANTYL) 250 UNIT/GM ointment, Apply 1 Application topically daily., Disp: 15 g, Rfl: 0   EPINEPHrine 0.3 mg/0.3 mL IJ SOAJ injection, Inject 0.3 mg into the muscle as needed for anaphylaxis., Disp: 1 each, Rfl: 2   furosemide (LASIX) 20 MG tablet, Take one tablet by mouth daily for the next 3 days then take daily as needed thereafter for lower extremity swelling, Disp: 30 tablet, Rfl: 0   gabapentin (NEURONTIN) 100 MG capsule, Take 1 capsule (100 mg total) by mouth 3 (three) times daily., Disp: 90 capsule, Rfl: 3   hydrOXYzine (ATARAX) 25 MG tablet, Take 1 tablet (25 mg total) by mouth every 6 (six) hours as needed for up to 21 doses for itching., Disp: 21 tablet, Rfl: 0   ibuprofen (ADVIL) 800 MG tablet, Take 1 tablet (800 mg total) by mouth every 6 (six) hours as needed., Disp: 60 tablet, Rfl: 1   metoprolol tartrate (LOPRESSOR) 50 MG tablet, TAKE 1 AND 1/2 TABLET BY MOUTH TWO TIMES A DAY, Disp: 270 tablet, Rfl: 2   metoprolol tartrate 75 MG TABS, Take 75 mg by mouth 2 (two) times daily., Disp: 60 tablet, Rfl: 0   Multiple Vitamins-Minerals (MULTIVITAMIN PO), Take 1 tablet by mouth daily., Disp: , Rfl:    omeprazole (PRILOSEC) 40 MG capsule, Take 40 mg by mouth daily., Disp: , Rfl: 10   oxyCODONE-acetaminophen (PERCOCET)  10-325 MG tablet, Take 1 tablet by mouth every 4 (four) hours as needed for pain., Disp: 30 tablet, Rfl: 0   oxyCODONE-acetaminophen (PERCOCET) 10-325 MG tablet, Take 1 tablet by mouth every 4 (four) hours as needed for pain., Disp: 30 tablet, Rfl: 0   oxyCODONE-acetaminophen (PERCOCET) 10-325 MG tablet, Take 1 tablet by mouth every 4 (four) hours as needed for pain., Disp: 30 tablet, Rfl: 0   oxyCODONE-acetaminophen (PERCOCET) 10-325 MG tablet, Take 1 tablet by mouth every 4 (  four) hours as needed for pain., Disp: 30 tablet, Rfl: 0   oxyCODONE-acetaminophen (PERCOCET) 10-325 MG tablet, Take 1 tablet by mouth every 4 (four) hours as needed for pain., Disp: 30 tablet, Rfl: 0   potassium chloride (KLOR-CON) 10 MEQ tablet, Take one tablet by mouth daily for the next 3 days then take daily as needed thereafter for lower extremity swelling, Disp: 30 tablet, Rfl: 0   valsartan-hydrochlorothiazide (DIOVAN HCT) 160-12.5 MG tablet, Take 1 tablet by mouth daily., Disp: 90 tablet, Rfl: 3   venlafaxine XR (EFFEXOR-XR) 75 MG 24 hr capsule, Take 1 capsule (75 mg total) by mouth daily with breakfast., Disp: 30 capsule, Rfl: 0    Physical Exam: There were no vitals taken for this visit.   Affect appropriate Healthy:  appears stated age HEENT: normal Neck supple with no adenopathy JVP normal no bruits no thyromegaly Lungs decreased BS left base  Heart:  S1/S2 no murmur, no rub, gallop or click PMI normal Abdomen: benighn, BS positve, no tenderness, no AAA no bruit.  No HSM or HJR Distal pulses intact with no bruits No edema Left achilles wound persists  Post right TKR   Labs:   Lab Results  Component Value Date   WBC 7.4 12/28/2021   HGB 13.3 12/28/2021   HCT 40.5 12/28/2021   MCV 89.2 12/28/2021   PLT 290 12/28/2021    No results for input(s): "NA", "K", "CL", "CO2", "BUN", "CREATININE", "CALCIUM", "PROT", "BILITOT", "ALKPHOS", "ALT", "AST", "GLUCOSE" in the last 168 hours.  Invalid  input(s): "LABALBU" No results found for: "CKTOTAL", "CKMB", "CKMBINDEX", "TROPONINI" No results found for: "CHOL" No results found for: "HDL" No results found for: "LDLCALC" Lab Results  Component Value Date   TRIG 114 05/29/2020   No results found for: "CHOLHDL" No results found for: "LDLDIRECT"    Radiology: IR Radiologist Eval & Mgmt  Result Date: 08/11/2022 EXAM: NEW PATIENT OFFICE VISIT CHIEF COMPLAINT: Electronic medical record HISTORY OF PRESENT ILLNESS: Electronic medical record REVIEW OF SYSTEMS: Electronic medical record PHYSICAL EXAMINATION: Electronic medical record ASSESSMENT AND PLAN: Electronic medical record Electronically Signed   By: Gilmer Mor D.O.   On: 08/11/2022 15:20     EKG: ST rate 102 normal no pre excitation 06/10/20    ASSESSMENT AND PLAN:   1. SVT: in setting of pneumococcal pneumonia and COVID continue beta blocker TTE no structural heart disease. Can consider EP referral for recurrence  2. Pulmonary:  F/u Elisha Headland CXR 06/09/20 persistent infiltrate post COVID/Pneumococcal sepsis and history of asthma Normal exam   3. Anticoagulation: post right knee surgery stopped with negative LE venous duplex and CTA for PE  4. HTN:  Well controlled.  Continue current medications and low sodium Dash type diet.     5. CAD:  Risk stratification  Calcium score 0 06/21/20   6. Angioedema:  idiopathic f/u allergy Rx with zyrtec and has Epipen F/U Dr Maurine Minister  7. Wound:  left achilles slow to heal normal ABI's 12/27/21  CTA per Dr Ardelle Anton and f/u wound center    F/U in a year  Refer EP any recurrence SVT   Signed: Charlton Haws 08/20/2022, 2:33 PM

## 2022-08-21 ENCOUNTER — Ambulatory Visit (HOSPITAL_COMMUNITY)
Admission: RE | Admit: 2022-08-21 | Discharge: 2022-08-21 | Disposition: A | Discharge status: Home / Self Care | Payer: Medicare PPO | Source: Ambulatory Visit | Attending: Interventional Radiology | Admitting: Interventional Radiology

## 2022-08-21 DIAGNOSIS — S91302A Unspecified open wound, left foot, initial encounter: Secondary | ICD-10-CM | POA: Diagnosis not present

## 2022-08-21 MED ORDER — IOHEXOL 350 MG/ML SOLN
100.0000 mL | Freq: Once | INTRAVENOUS | Status: AC | PRN
  Administered 2022-08-21: 100 mL via INTRAVENOUS

## 2022-08-23 ENCOUNTER — Ambulatory Visit
Admission: RE | Admit: 2022-08-23 | Discharge: 2022-08-23 | Disposition: A | Discharge status: Home / Self Care | Payer: Medicare PPO | Source: Ambulatory Visit | Attending: Interventional Radiology | Admitting: Interventional Radiology

## 2022-08-23 DIAGNOSIS — S91302A Unspecified open wound, left foot, initial encounter: Secondary | ICD-10-CM

## 2022-08-23 HISTORY — PX: IR RADIOLOGIST EVAL & MGMT: IMG5224

## 2022-08-23 NOTE — Progress Notes (Signed)
Chief Complaint: Left heel wound   Referring Physician(s): Patel,Kevin P Wound care: Dr. Lady Gary Cardiology: Dr. Charlton Haws   PCP: Darrow Bussing, MD [facility]   History of Present Illness: [name] is a 67 y.o. female presenting today as a scheduled follow up appointment to VIR clinic.    She joins [place] today by way of telemedicine/virtual visit, with her sister.  We confirmed her identity with 2 personal identifiers.    History:   She had surgery August 21 on her left achilles, and that she was then treated for an infection.  Since then she has had some difficulty healing.    Noninvsasive 12/27/21 Right ABI: 1.04 Left ABI: 1.11 Multiphasic waveforms on the segmental at the ankle. No prior cross-sectional imaging   We met her 02/10/22, and had plans for CTA runoff.  This never happened for some reason.   We saw her again 08/11/22 as a follow up, and given her slow healing we ordered the CTA runoff.     Interval Hx: Today She tells me today that her wound remains unhealed.  She continues to see wound care and Dr. Lady Gary, as well as Dr. Allena Katz. She did let me know today that she will be moving with her sister to the [place] area (at least temporarily) and that her wound care is being transferred to [facility] wound care facility.   Today we reviewed her CTA and runoff that was performed 08/21/22.  Essentially, it is normal, with no iliac, femoral-popliteal, or tibial disease of the right or left.    I did let them know the result of the scan, which is concordant with the ABI exam from last September.      Past Medical History:  Diagnosis Date   Asthma    Asthma    Depression    Depression    Depression    Erosive esophagitis    Headaches, cluster    Hepatitis C    HTN (hypertension)    HTN (hypertension)    HTN (hypertension)    Hypokalemia 05/29/2020    Past Surgical History:  Procedure Laterality Date   IR RADIOLOGIST EVAL & MGMT  02/10/2022   IR  RADIOLOGIST EVAL & MGMT  08/11/2022   KNEE SURGERY  04/2020    Allergies: Atenolol, Lisinopril, Paxil [paroxetine hcl], Zoloft [sertraline hcl], and Elemental sulfur  Medications: Prior to Admission medications   Medication Sig Start Date End Date Taking? Authorizing Provider  amLODipine (NORVASC) 5 MG tablet Take 1 tablet (5 mg total) by mouth daily. 12/30/21   Burnadette Pop, MD  amoxicillin-clavulanate (AUGMENTIN) 875-125 MG tablet Take 1 tablet by mouth 2 (two) times daily. 04/04/22   McDonald, Rachelle Hora, DPM  amoxicillin-clavulanate (AUGMENTIN) 875-125 MG tablet Take 1 tablet by mouth 2 (two) times daily. 04/11/22   Candelaria Stagers, DPM  Calcium Carb-Cholecalciferol (CALCIUM-VITAMIN D3) 600-400 MG-UNIT TABS Take 1 tablet by mouth daily.    [provider]  collagenase (SANTYL) 250 UNIT/GM ointment Apply 1 Application topically daily. 03/03/22   Candelaria Stagers, DPM  EPINEPHrine 0.3 mg/0.3 mL IJ SOAJ injection Inject 0.3 mg into the muscle as needed for anaphylaxis. 01/07/22   Terald Sleeper, MD  furosemide (LASIX) 20 MG tablet Take one tablet by mouth daily for the next 3 days then take daily as needed thereafter for lower extremity swelling 02/08/22   Sharlene Dory, PA-C  gabapentin (NEURONTIN) 100 MG capsule Take 1 capsule (100 mg total) by mouth  3 (three) times daily. 12/21/21   Candelaria Stagers, DPM  hydrOXYzine (ATARAX) 25 MG tablet Take 1 tablet (25 mg total) by mouth every 6 (six) hours as needed for up to 21 doses for itching. 01/07/22   Terald Sleeper, MD  ibuprofen (ADVIL) 800 MG tablet Take 1 tablet (800 mg total) by mouth every 6 (six) hours as needed. 12/12/21   Candelaria Stagers, DPM  metoprolol tartrate (LOPRESSOR) 50 MG tablet TAKE 1 AND 1/2 TABLET BY MOUTH TWO TIMES A DAY 04/05/22   Wendall Stade, MD  metoprolol tartrate 75 MG TABS Take 75 mg by mouth 2 (two) times daily. 12/29/21   Burnadette Pop, MD  Multiple Vitamins-Minerals (MULTIVITAMIN PO) Take 1 tablet by mouth  daily.    [provider]  omeprazole (PRILOSEC) 40 MG capsule Take 40 mg by mouth daily. 10/14/15   [provider]  oxyCODONE-acetaminophen (PERCOCET) 10-325 MG tablet Take 1 tablet by mouth every 4 (four) hours as needed for pain. 04/25/22   Candelaria Stagers, DPM  oxyCODONE-acetaminophen (PERCOCET) 10-325 MG tablet Take 1 tablet by mouth every 4 (four) hours as needed for pain. 05/03/22   Candelaria Stagers, DPM  oxyCODONE-acetaminophen (PERCOCET) 10-325 MG tablet Take 1 tablet by mouth every 4 (four) hours as needed for pain. 05/10/22   Candelaria Stagers, DPM  oxyCODONE-acetaminophen (PERCOCET) 10-325 MG tablet Take 1 tablet by mouth every 4 (four) hours as needed for pain. 06/15/22   Candelaria Stagers, DPM  oxyCODONE-acetaminophen (PERCOCET) 10-325 MG tablet Take 1 tablet by mouth every 4 (four) hours as needed for pain. 07/12/22   Candelaria Stagers, DPM  potassium chloride (KLOR-CON) 10 MEQ tablet Take one tablet by mouth daily for the next 3 days then take daily as needed thereafter for lower extremity swelling 02/08/22   Sharlene Dory, PA-C  valsartan-hydrochlorothiazide (DIOVAN HCT) 160-12.5 MG tablet Take 1 tablet by mouth daily. 06/20/22   Wendall Stade, MD  venlafaxine XR (EFFEXOR-XR) 75 MG 24 hr capsule Take 1 capsule (75 mg total) by mouth daily with breakfast. 12/30/21   Burnadette Pop, MD     Family History  Problem Relation Age of Onset   Emphysema Father    Coronary artery disease Mother        CABG   Diabetes Mellitus II Mother    Heart disease Mother    Stroke Brother    Heart attack Sister    Crohn's disease Sister    Liver disease Sister    Heart disease Brother    Colitis Brother    Heart disease Brother    Irritable bowel syndrome Daughter    Bipolar disorder Daughter    Breast cancer Neg Hx     Social History   Socioeconomic History   Marital status: Widowed    Spouse name: Not on file   Number of children: Not on file   Years of education: Not on file    Highest education level: Not on file  Occupational History   Not on file  Tobacco Use   Smoking status: Never   Smokeless tobacco: Never  Substance and Sexual Activity   Alcohol use: Not Currently    Alcohol/week: 0.0 standard drinks of alcohol   Drug use: Not on file   Sexual activity: Not on file  Other Topics Concern   Not on file  Social History Narrative   Not on file   Social Determinants of Health   Financial Resource Strain:  Not on file  Food Insecurity: No Food Insecurity (12/28/2021)   Hunger Vital Sign    Worried About Running Out of Food in the Last Year: Never true    Ran Out of Food in the Last Year: Never true  Transportation Needs: No Transportation Needs (12/28/2021)   PRAPARE - Administrator, Civil Service (Medical): No    Lack of Transportation (Non-Medical): No  Physical Activity: Not on file  Stress: Not on file  Social Connections: Not on file      Review of Systems  Review of Systems: A 12 point ROS discussed and pertinent positives are indicated in the HPI above.  All other systems are negative.  Advance Care Plan: The advanced care plan/surrogate decision maker was discussed at the time of visit and documented in the medical record.    Physical Exam No direct physical exam was performed (except for noted visual exam findings with Video Visits).    Vital Signs: There were no vitals taken for this visit.  Imaging: CT ANGIO AO+BIFEM W & OR WO CONTRAST  Result Date: 08/22/2022 CLINICAL DATA:  Slowly healing left foot wound. Evaluate for vascular insufficiency. EXAM: CT ANGIOGRAPHY OF ABDOMINAL AORTA WITH ILIOFEMORAL RUNOFF TECHNIQUE: Multidetector CT imaging of the abdomen, pelvis and lower extremities was performed using the standard protocol during bolus administration of intravenous contrast. Multiplanar CT image reconstructions and MIPs were obtained to evaluate the vascular anatomy. RADIATION DOSE REDUCTION: This exam was performed  according to the departmental dose-optimization program which includes automated exposure control, adjustment of the mA and/or kV according to patient size and/or use of iterative reconstruction technique. CONTRAST:  OMNIPAQUE IOHEXOL 350 MG/ML SOLN COMPARISON:  Abdominal MRI 06/13/2011 FINDINGS: VASCULAR Aorta: Normal caliber aorta without aneurysm, dissection, vasculitis or significant stenosis. Celiac: Patent without evidence of aneurysm, dissection, vasculitis or significant stenosis. SMA: Patent without evidence of aneurysm, dissection, vasculitis or significant stenosis. Renals: Both renal arteries are patent without evidence of aneurysm, dissection, vasculitis, fibromuscular dysplasia or significant stenosis. IMA: Patent without evidence of aneurysm, dissection, vasculitis or significant stenosis. RIGHT Lower Extremity Inflow: Common, internal and external iliac arteries are patent without evidence of aneurysm, dissection, vasculitis or significant stenosis. Outflow: Common, superficial and profunda femoral arteries and the popliteal artery are patent without evidence of aneurysm, dissection, vasculitis or significant stenosis. Runoff: Patent three vessel runoff to the ankle. LEFT Lower Extremity Inflow: Common, internal and external iliac arteries are patent without evidence of aneurysm, dissection, vasculitis or significant stenosis. Outflow: Common, superficial and profunda femoral arteries and the popliteal artery are patent without evidence of aneurysm, dissection, vasculitis or significant stenosis. Runoff: Patent three vessel runoff to the ankle. Veins: No focal venous abnormality. Review of the MIP images confirms the above findings. NON-VASCULAR Lower chest: No acute abnormality. Hepatobiliary: Low attenuation of the hepatic parenchyma with sparing adjacent to the gallbladder fossa. Findings are consistent with hepatic steatosis. Normal morphology and contour. No discrete lesion. Gallbladder is  unremarkable. No intra or extrahepatic biliary ductal dilatation. Pancreas: Unremarkable. No pancreatic ductal dilatation or surrounding inflammatory changes. Spleen: Normal in size without focal abnormality. Adrenals/Urinary Tract: 3.8 cm right adrenal mass with macroscopic fat. This lesion was previously evaluated on MRI of the abdomen from February of 2013 at which point it was diagnosed as a lipoma. This lesion is considered benign and no further imaging follow-up is recommended. Normal left adrenal gland. No hydronephrosis, nephrolithiasis or enhancing renal mass. Ureters and bladder are unremarkable. Stomach/Bowel: No evidence of  obstruction or focal bowel wall thickening. Normal appendix in the right lower quadrant. The terminal ileum is unremarkable. Lymphatic: No suspicious lymphadenopathy. Reproductive: Fibroid uterus.  No adnexal masses. Other: No abdominal wall hernia or abnormality. No abdominopelvic ascites. Musculoskeletal: No acute fracture or aggressive appearing lytic or blastic osseous lesion. Bilateral knee medial compartment hemiarthroplasty. IMPRESSION: VASCULAR 1. No evidence of significant atherosclerotic plaque, stenosis or occlusion in either lower extremity arterial tree. NON-VASCULAR 1. Hepatic steatosis. 2. Benign right adrenal adenoma. No imaging follow-up is recommended. 3. Bilateral knee medial compartment hemiarthroplasty. Electronically Signed   By: Malachy Moan M.D.   On: 08/22/2022 08:08   IR Radiologist Eval & Mgmt  Result Date: 08/11/2022 EXAM: NEW PATIENT OFFICE VISIT CHIEF COMPLAINT: Electronic medical record HISTORY OF PRESENT ILLNESS: Electronic medical record REVIEW OF SYSTEMS: Electronic medical record PHYSICAL EXAMINATION: Electronic medical record ASSESSMENT AND PLAN: Electronic medical record Electronically Signed   By: Gilmer Mor D.O.   On: 08/11/2022 15:20    Labs:  CBC: Recent Labs    12/25/21 1949 12/26/21 0321 12/27/21 0323 12/28/21 0330  WBC  9.3 8.3 6.7 7.4  HGB 13.2 12.6 12.9 13.3  HCT 39.7 38.3 38.7 40.5  PLT 300 287 267 290    COAGS: No results for input(s): "INR", "APTT" in the last 8760 hours.  BMP: Recent Labs    12/26/21 0321 12/27/21 0323 12/28/21 0330 12/29/21 0405 02/08/22 1538 05/24/22 0937 07/04/22 1614  NA 144 142 146* 145 141 144 144  K 3.0* 2.9* 3.1* 3.3* 4.4 4.4 3.8  CL 106 104 107 105 100 101 100  CO2 28 28 29 29 24 25 29   GLUCOSE 131* 112* 113* 105* 83 103* 73  BUN 21 20 19 17 20 12 18   CALCIUM 9.2 9.4 9.8 9.6 10.4* 10.1 10.1  CREATININE 0.70 0.60 0.73 0.80 0.77 0.61 0.80  GFRNONAA >60 >60 >60 >60  --   --   --     LIVER FUNCTION TESTS: No results for input(s): "BILITOT", "AST", "ALT", "ALKPHOS", "PROT", "ALBUMIN" in the last 8760 hours.  TUMOR MARKERS: No results for input(s): "AFPTM", "CEA", "CA199", "CHROMGRNA" in the last 8760 hours.  Assessment and Plan:   Assessment:  Ms Casseus is a very pleasant 67 yo female presenting with slow healing of a post-surgical wound, left heel.   Since our last visit we ordered a CTA with runoff to see if there was any occult arterial disease that might be contributing, however, the study is essentially clean of significant arterial disease.  This is concordant with her previous non-invasive lower extremity exam, which is normal ABI.    Today we reviewed the results.  She asked a few questions regarding the specific of wound care, and I did let her know that those are best for her wound care specialists.      At this point, we will not schedule a follow up for her, but we are happy to see her on an as needed basis.     Electronically Signed: Gilmer Mor 08/23/2022, 1:48 PM   I spent a total of    15 Minutes in remote  clinical consultation, greater than 50% of which was counseling/coordinating care for left post-surgical wound, possible arterial disease.    Visit type: Audio only (telephone). Audio (no video) only due to patient's lack of  internet/smartphone capability. Alternative for in-person consultation at [facility], [address] [place], [place], [place]. This visit type was conducted due to national recommendations for restrictions regarding  the COVID-19 Pandemic (e.g. social distancing).  This format is felt to be most appropriate for this patient at this time.  All issues noted in this document were discussed and addressed.

## 2022-08-24 ENCOUNTER — Ambulatory Visit: Payer: Medicare PPO | Attending: Cardiovascular Disease | Admitting: Cardiovascular Disease

## 2022-08-24 ENCOUNTER — Encounter: Payer: Self-pay | Admitting: Cardiovascular Disease

## 2022-08-24 VITALS — BP 140/100 | HR 90 | Ht 64.0 in | Wt 215.0 lb

## 2022-08-24 DIAGNOSIS — I10 Essential (primary) hypertension: Secondary | ICD-10-CM

## 2022-08-24 DIAGNOSIS — I2510 Atherosclerotic heart disease of native coronary artery without angina pectoris: Secondary | ICD-10-CM

## 2022-08-24 DIAGNOSIS — I471 Supraventricular tachycardia, unspecified: Secondary | ICD-10-CM | POA: Diagnosis not present

## 2022-08-24 MED ORDER — VALSARTAN-HYDROCHLOROTHIAZIDE 320-12.5 MG PO TABS: 1.0000 | ORAL_TABLET | Freq: Every day | ORAL | 3 refills | Status: AC

## 2022-08-24 NOTE — Patient Instructions (Addendum)
Medication Instructions:  Your physician has recommended you make the following change in your medication:  1 INCREASE Diovan 320/12.5 mg by mouth daily.  *If you need a refill on your cardiac medications before your next appointment, please call your pharmacy*  Lab Work: If you have labs (blood work) drawn today and your tests are completely normal, you will receive your results only by: MyChart Message (if you have MyChart) OR A paper copy in the mail If you have any lab test that is abnormal or we need to change your treatment, we will call you to review the results.  Cardiac CT scanning for calcium score, (CAT scanning), is a noninvasive, special x-ray that produces cross-sectional images of the body using x-rays and a computer. CT scans help physicians diagnose and treat medical conditions. For some CT exams, a contrast material is used to enhance visibility in the area of the body being studied. CT scans provide greater clarity and reveal more details than regular x-ray exams.  Follow-Up: At [facility], you and your health needs are our priority.  As part of our continuing mission to provide you with exceptional heart care, we have created designated Provider Care Teams.  These Care Teams include your primary Cardiologist (physician) and Advanced Practice Providers (APPs -  Physician Assistants and Nurse Practitioners) who all work together to provide you with the care you need, when you need it.  We recommend signing up for the patient portal called "MyChart".  Sign up information is provided on this After Visit Summary.  MyChart is used to connect with patients for Virtual Visits (Telemedicine).  Patients are able to view lab/test results, encounter notes, upcoming appointments, etc.  Non-urgent messages can be sent to your provider as well.   To learn more about what you can do with MyChart, go to [email or web address].    Your next appointment:   1 year(s)  Provider:    Charlton Haws, MD

## 2022-08-28 ENCOUNTER — Other Ambulatory Visit: Payer: Self-pay | Admitting: Podiatry

## 2022-08-28 ENCOUNTER — Encounter: Payer: Self-pay | Admitting: Podiatry

## 2022-08-28 MED ORDER — OXYCODONE-ACETAMINOPHEN 10-325 MG PO TABS: 1.0000 | ORAL_TABLET | ORAL | 0 refills | Status: AC | PRN

## 2022-08-30 ENCOUNTER — Ambulatory Visit (HOSPITAL_BASED_OUTPATIENT_CLINIC_OR_DEPARTMENT_OTHER)
Admission: RE | Admit: 2022-08-30 | Discharge: 2022-08-30 | Disposition: A | Discharge status: Home / Self Care | Payer: Medicare PPO | Source: Ambulatory Visit | Attending: Cardiovascular Disease | Admitting: Cardiovascular Disease

## 2022-08-30 DIAGNOSIS — I2510 Atherosclerotic heart disease of native coronary artery without angina pectoris: Secondary | ICD-10-CM | POA: Insufficient documentation

## 2024-02-21 IMAGING — MG MM DIGITAL SCREENING BILAT W/ TOMO AND CAD
6 of 10 series · 6 of 30 positions shown · non-contrast
Comparison: Previous exam(s).

CLINICAL DATA: Screening.

EXAM:
DIGITAL SCREENING BILATERAL MAMMOGRAM WITH TOMOSYNTHESIS AND CAD
TECHNIQUE: Bilateral screening digital craniocaudal and mediolateral oblique
mammograms were obtained. Bilateral screening digital breast
tomosynthesis was performed. The images were evaluated with
computer-aided detection.

[L MLO synth-2D]
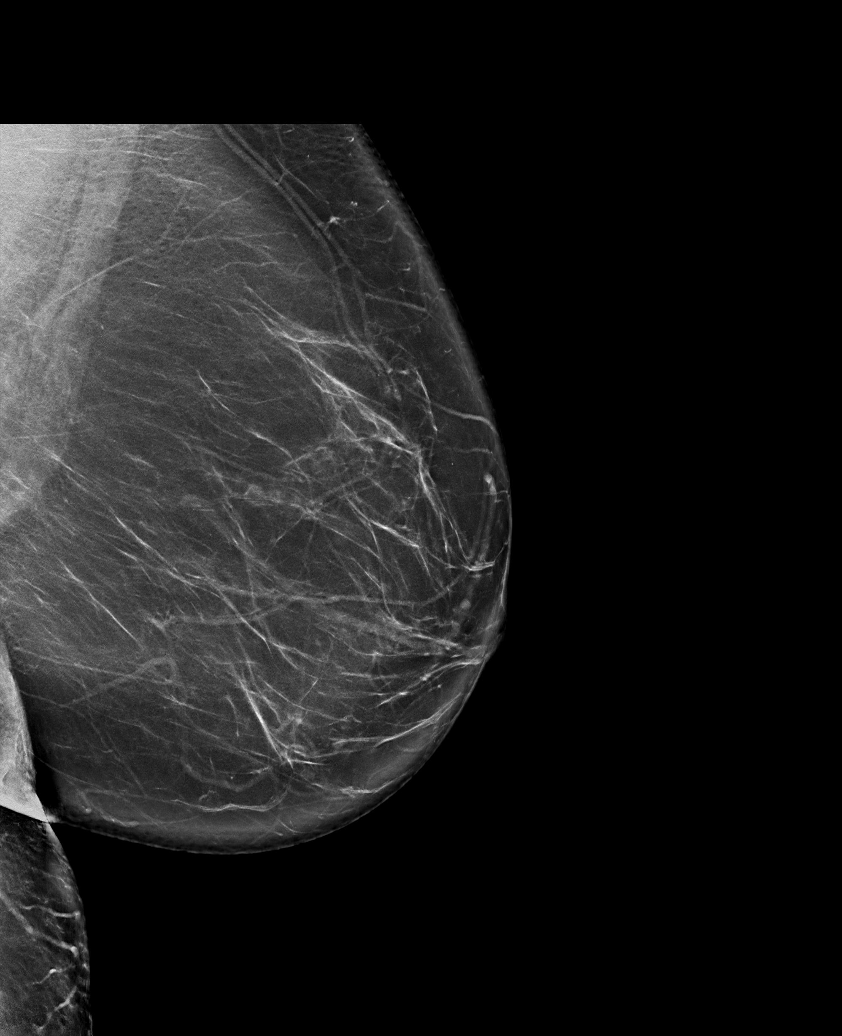

[L CC synth-2D]
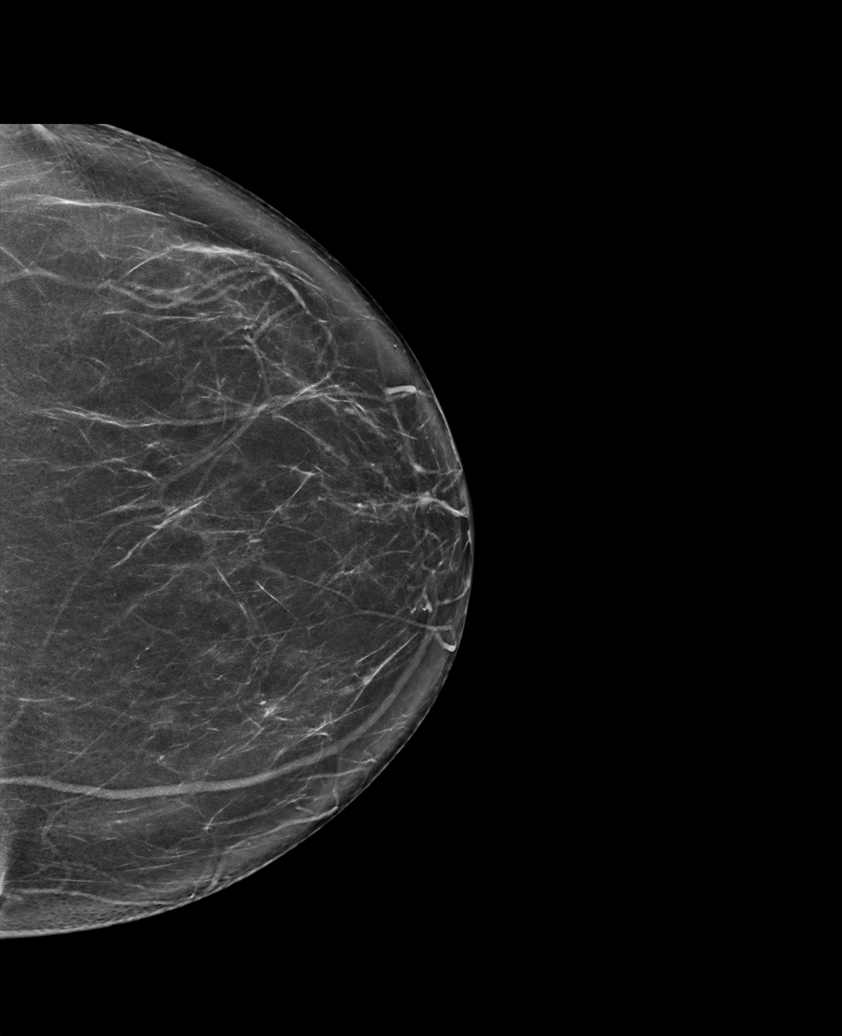

[R CC synth-2D (1 of 2)]
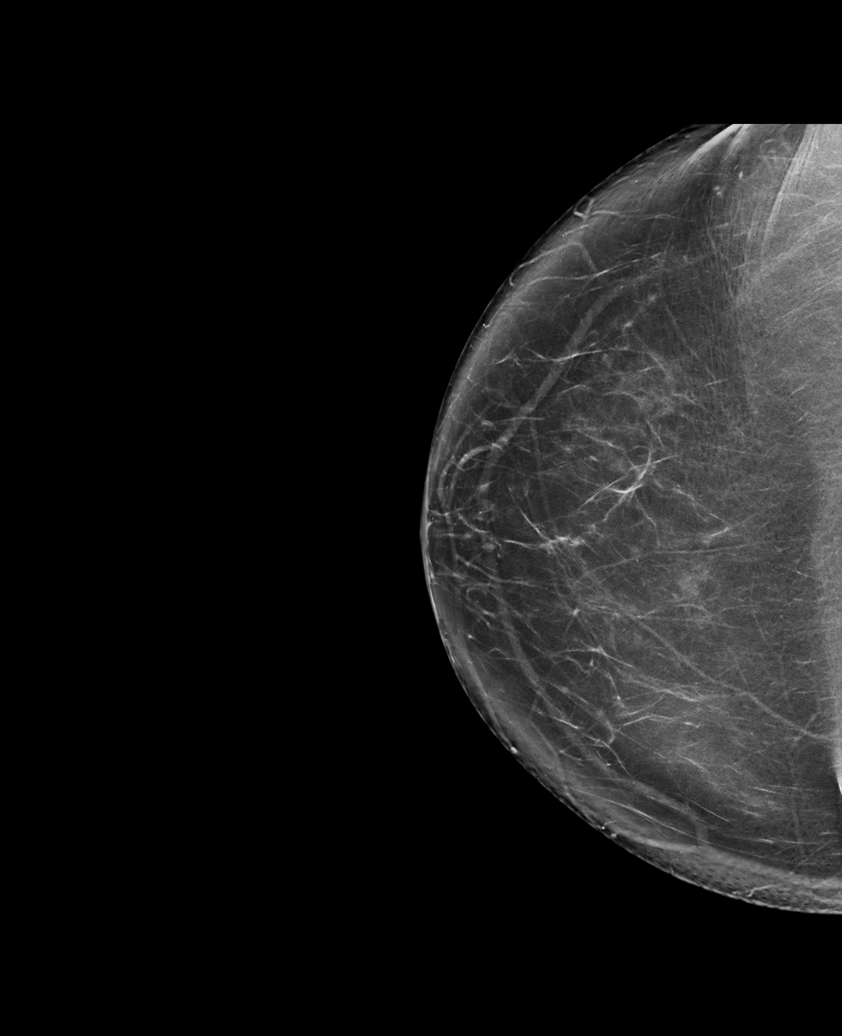

[R MLO synth-2D]
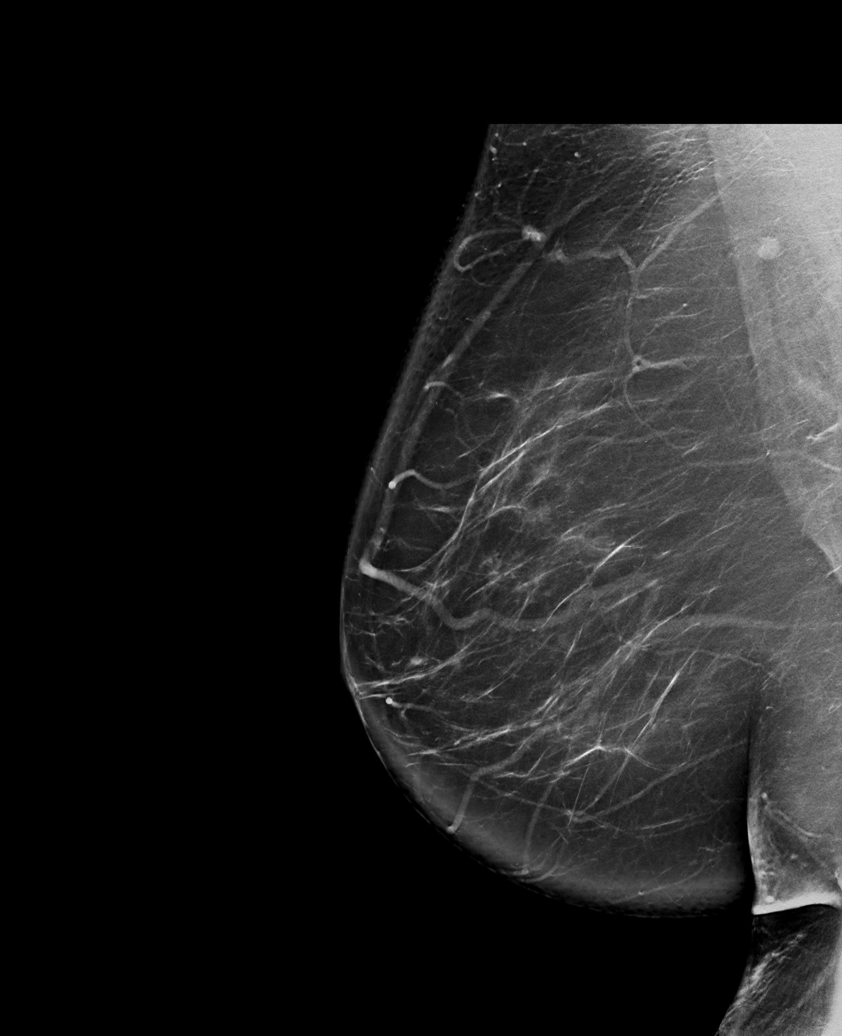

[R CC synth-2D (2 of 2)]
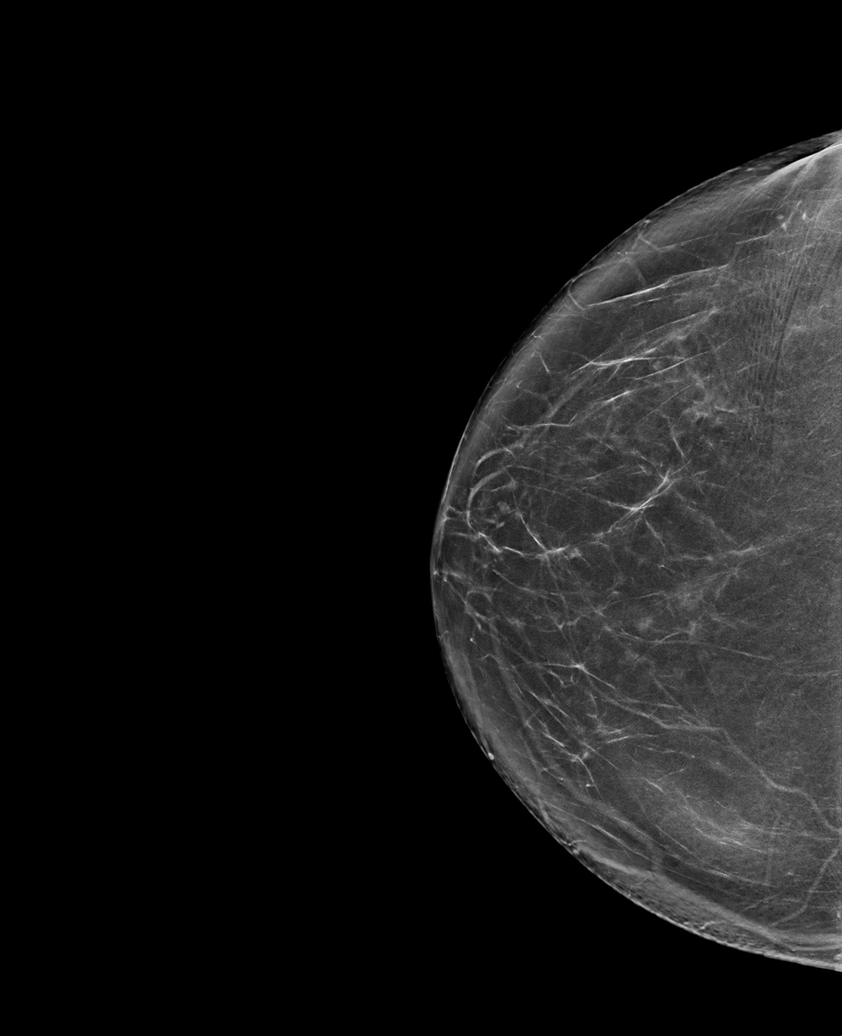

[L CC tomo · tomo slice 42/83.0]
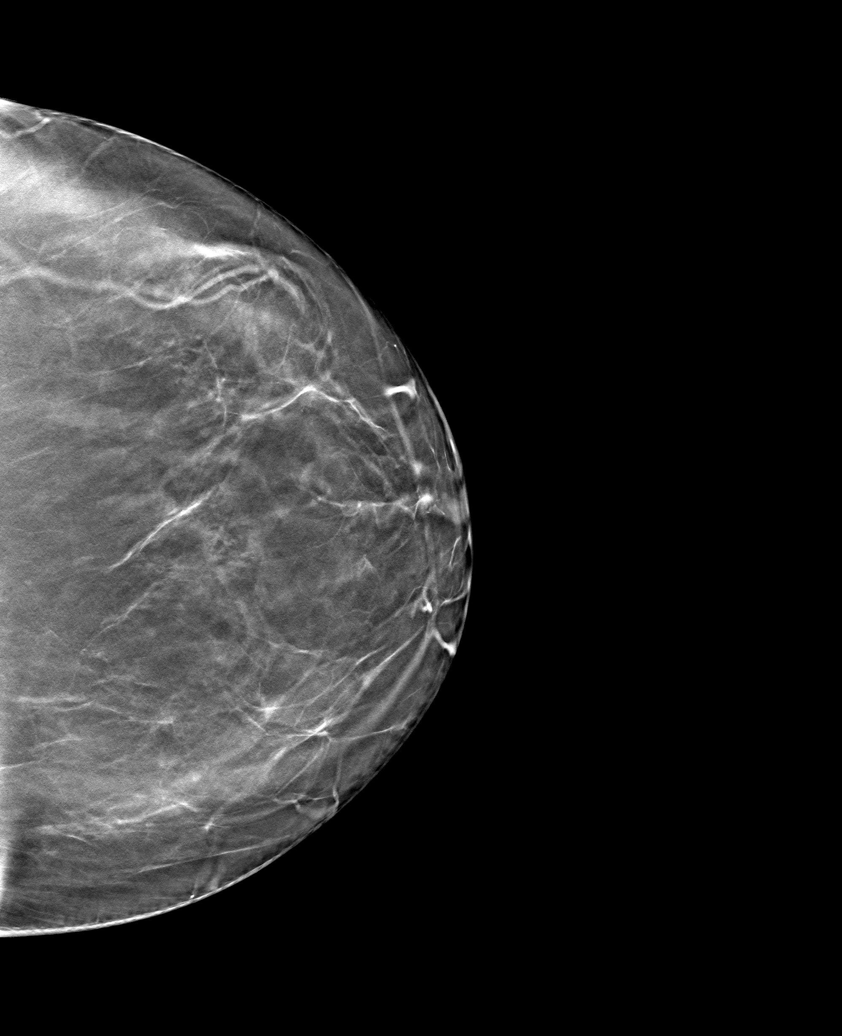

[6 of 30 positions shown; findings below may reference images not displayed]

ACR Breast Density Category b: There are scattered areas of
fibroglandular density.
FINDINGS: There are no findings suspicious for malignancy.
IMPRESSION: No mammographic evidence of malignancy. A result letter of this
screening mammogram will be mailed directly to the patient.

RECOMMENDATION:
Screening mammogram in one year. (Code:51-O-LD2)

BI-RADS CATEGORY  1: Negative.
